# Patient Record
Sex: Male | Born: 1970
Health system: Southern US, Community
[De-identification: ages and names within clinical notes are randomized; demographics above are authoritative.]

## PROBLEM LIST (undated history)

## (undated) DIAGNOSIS — E119 Type 2 diabetes mellitus without complications: Secondary | ICD-10-CM

## (undated) DIAGNOSIS — K7460 Unspecified cirrhosis of liver: Secondary | ICD-10-CM

## (undated) DIAGNOSIS — I8500 Esophageal varices without bleeding: Secondary | ICD-10-CM

## (undated) DIAGNOSIS — H539 Unspecified visual disturbance: Secondary | ICD-10-CM

## (undated) HISTORY — DX: Unspecified visual disturbance: H53.9

## (undated) HISTORY — DX: Unspecified cirrhosis of liver: K74.60

## (undated) HISTORY — DX: Type 2 diabetes mellitus without complications: E11.9

## (undated) HISTORY — PX: SHOULDER ARTHROSCOPY: SHX128

---

## 2012-09-23 ENCOUNTER — Other Ambulatory Visit: Payer: Self-pay | Admitting: Family Medicine

## 2012-09-23 DIAGNOSIS — R7989 Other specified abnormal findings of blood chemistry: Secondary | ICD-10-CM

## 2012-09-28 ENCOUNTER — Ambulatory Visit
Admission: RE | Admit: 2012-09-28 | Discharge: 2012-09-28 | Disposition: A | Discharge status: Home / Self Care | Payer: BC Managed Care – PPO | Source: Ambulatory Visit | Attending: Family Medicine | Admitting: Family Medicine

## 2012-09-28 DIAGNOSIS — R7989 Other specified abnormal findings of blood chemistry: Secondary | ICD-10-CM

## 2012-10-19 ENCOUNTER — Ambulatory Visit: Payer: Self-pay | Admitting: *Deleted

## 2014-03-28 ENCOUNTER — Other Ambulatory Visit: Payer: Self-pay | Admitting: Family Medicine

## 2014-03-28 ENCOUNTER — Ambulatory Visit
Admission: RE | Admit: 2014-03-28 | Discharge: 2014-03-28 | Disposition: A | Discharge status: Home / Self Care | Payer: BLUE CROSS/BLUE SHIELD | Source: Ambulatory Visit | Attending: Family Medicine | Admitting: Family Medicine

## 2014-03-28 DIAGNOSIS — R0789 Other chest pain: Secondary | ICD-10-CM

## 2014-04-26 ENCOUNTER — Other Ambulatory Visit: Payer: Self-pay | Admitting: Gastroenterology

## 2014-04-26 DIAGNOSIS — A09 Infectious gastroenteritis and colitis, unspecified: Secondary | ICD-10-CM

## 2014-04-26 DIAGNOSIS — R1013 Epigastric pain: Secondary | ICD-10-CM

## 2014-05-03 ENCOUNTER — Ambulatory Visit
Admission: RE | Admit: 2014-05-03 | Discharge: 2014-05-03 | Disposition: A | Discharge status: Home / Self Care | Payer: BLUE CROSS/BLUE SHIELD | Source: Ambulatory Visit | Attending: Gastroenterology | Admitting: Gastroenterology

## 2014-05-03 DIAGNOSIS — R1013 Epigastric pain: Secondary | ICD-10-CM

## 2015-07-12 IMAGING — RF DG UGI W/ HIGH DENSITY W/KUB
17 series · 17 of 17 positions shown · non-contrast
Comparison: Ultrasound from the same day

CLINICAL DATA: Epigastric pain.  Diabetes.

EXAM:
UPPER GI SERIES WITH KUB
TECHNIQUE: After obtaining a scout radiograph a routine upper GI series was
performed using thin and high density barium.
FLUOROSCOPY TIME:  1 minutes 54 seconds

[Series 1: run · 1 of 1 slices shown (1 of 16)]
[im 1/1]
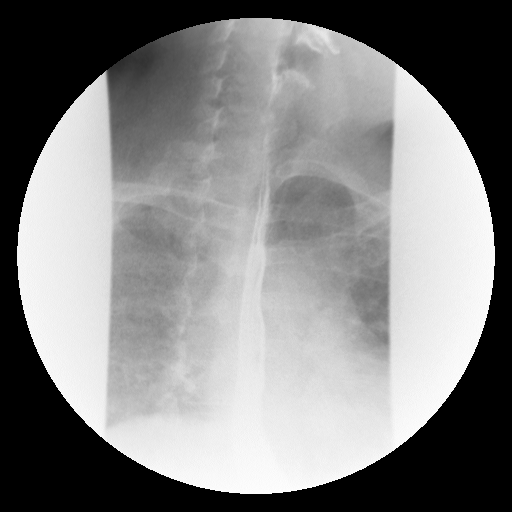

[Series 2: run · 1 of 1 slices shown (2 of 16)]
[im 1/1]
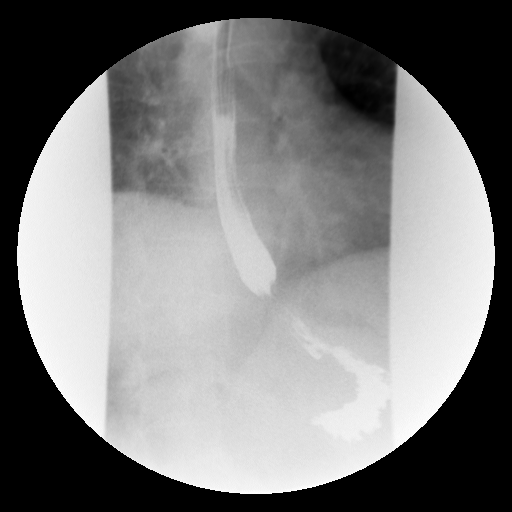

[Series 3: run · 1 of 1 slices shown (3 of 16)]
[im 1/1]
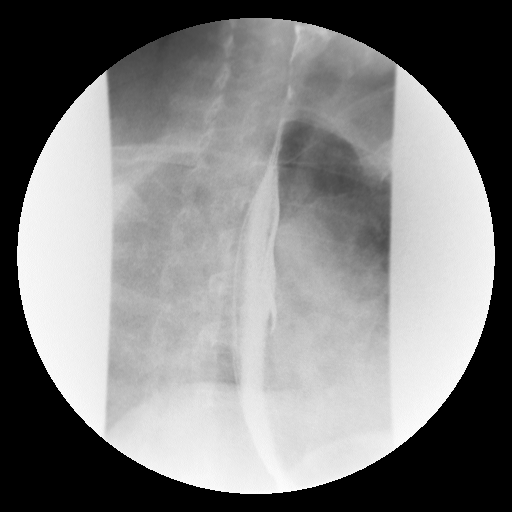

[Series 4: run · 1 of 1 slices shown (4 of 16)]
[im 1/1]
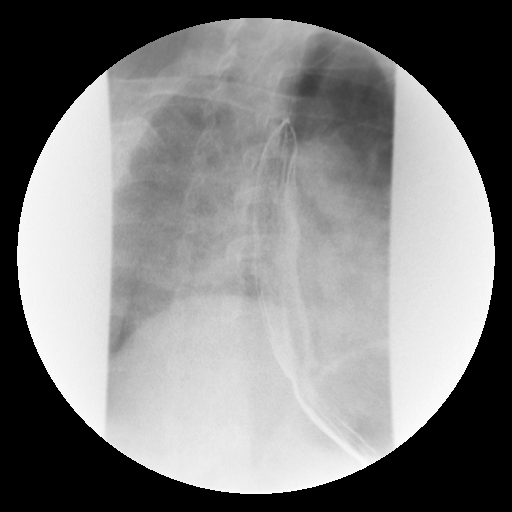

[Series 5: run · 1 of 1 slices shown (5 of 16)]
[im 1/1]
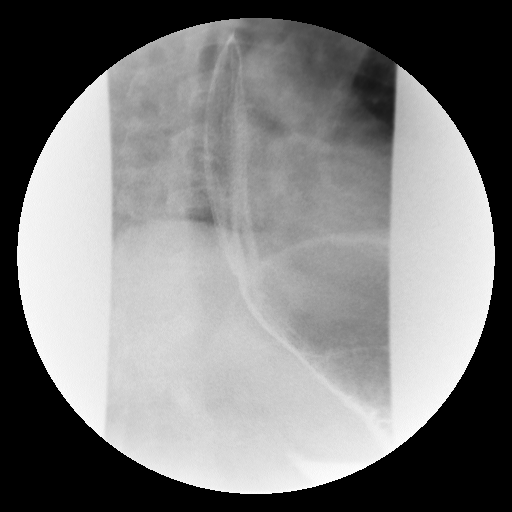

[Series 6: run · 1 of 1 slices shown (6 of 16)]
[im 1/1]
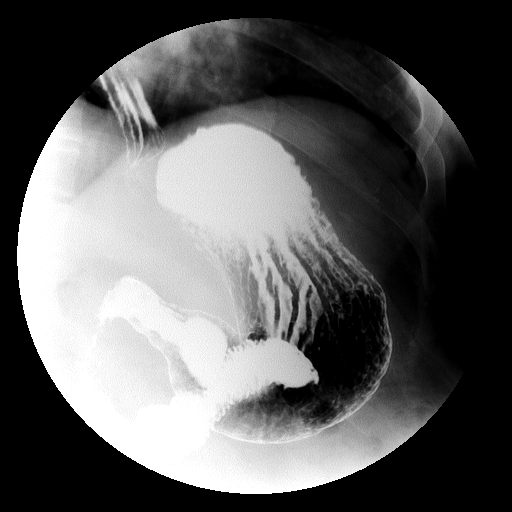

[Series 7: run · 1 of 1 slices shown (7 of 16)]
[im 1/1]
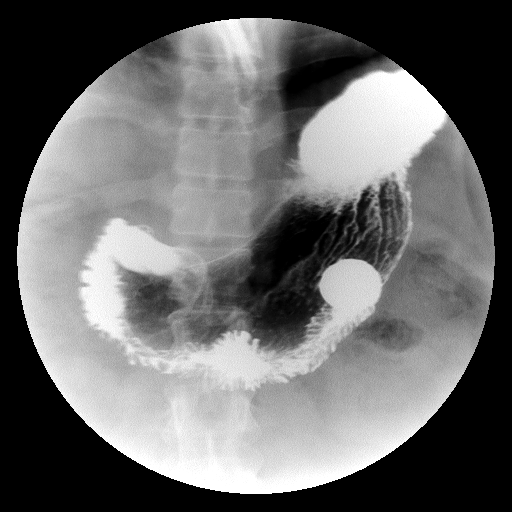

[Series 8: run · 1 of 1 slices shown (8 of 16)]
[im 1/1]
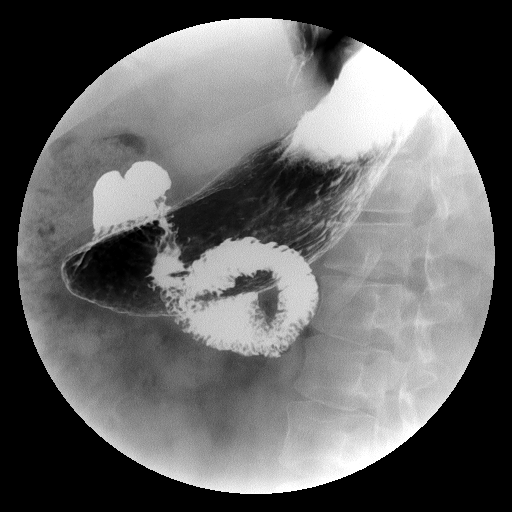

[Series 9: run · 1 of 1 slices shown (9 of 16)]
[im 1/1]
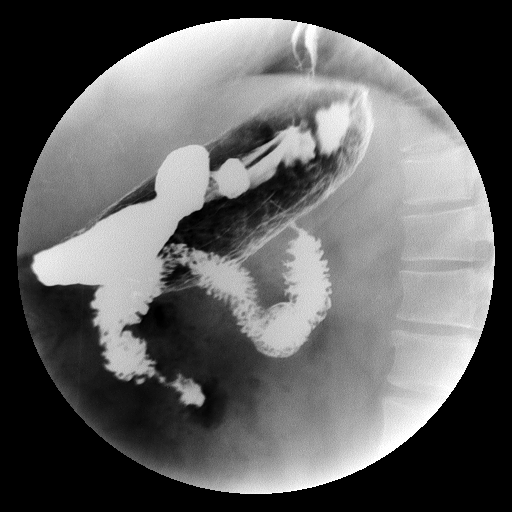

[Series 10: run · 1 of 1 slices shown (10 of 16)]
[im 1/1]
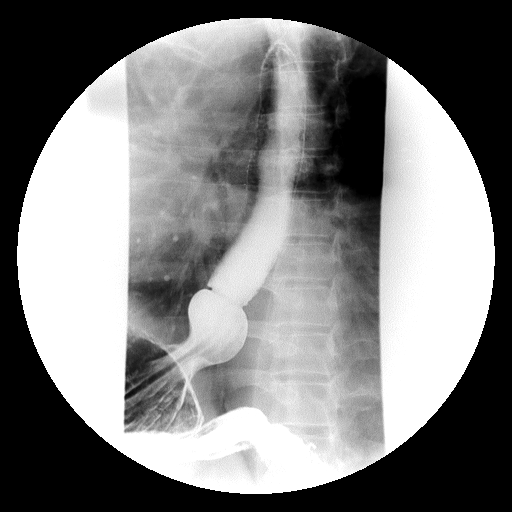

[Series 11: run · 1 of 1 slices shown (11 of 16)]
[im 1/1]
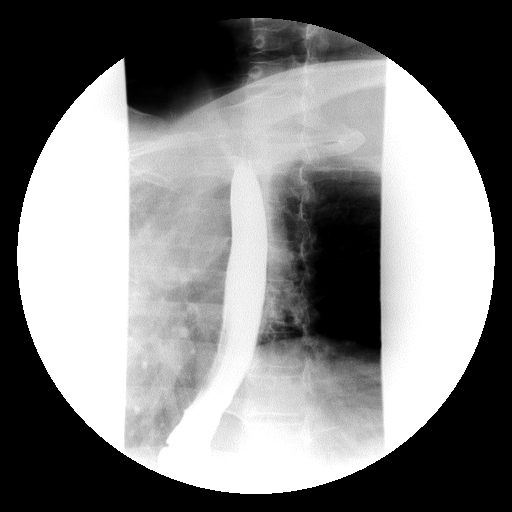

[Series 12: run · 1 of 1 slices shown (12 of 16)]
[im 1/1]
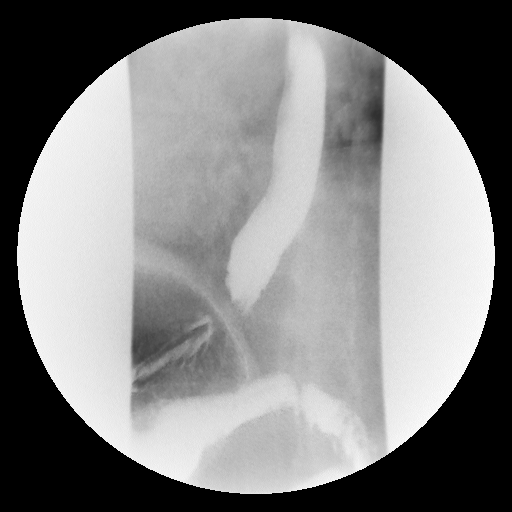

[Series 13: run · 1 of 1 slices shown (13 of 16)]
[im 1/1]
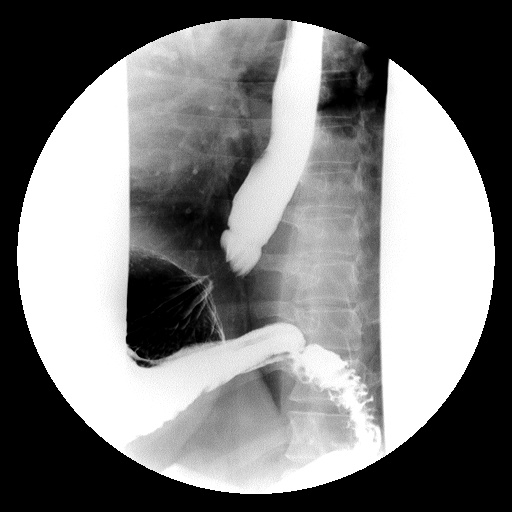

[Series 14: run · 1 of 1 slices shown (14 of 16)]
[im 1/1]
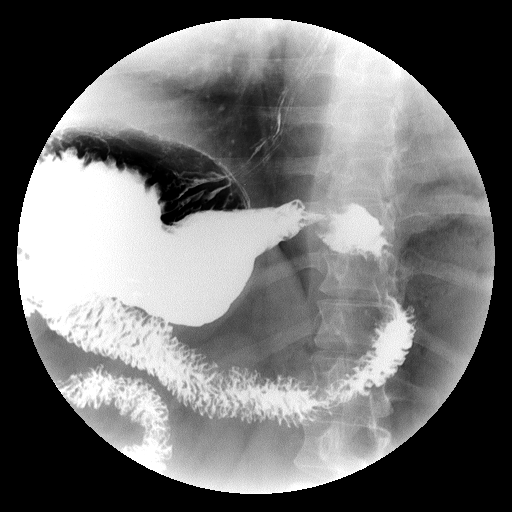

[Series 15: run · 1 of 1 slices shown (15 of 16)]
[im 1/1]
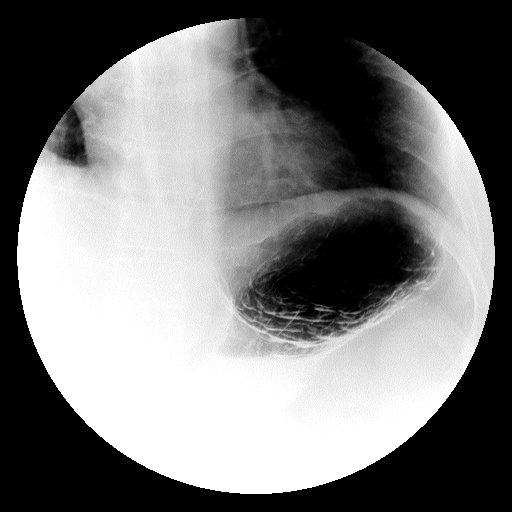

[Series 16: run · 1 of 1 slices shown (16 of 16)]
[im 1/1]
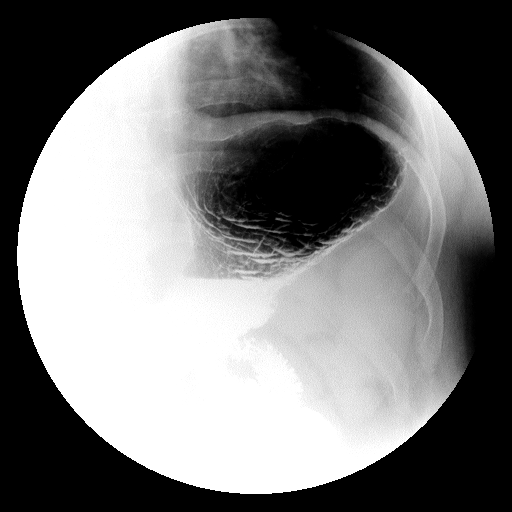

[Series 1001: view not recorded · 0.20mm/px · 1 of 1 slices shown]
[im 1/1]
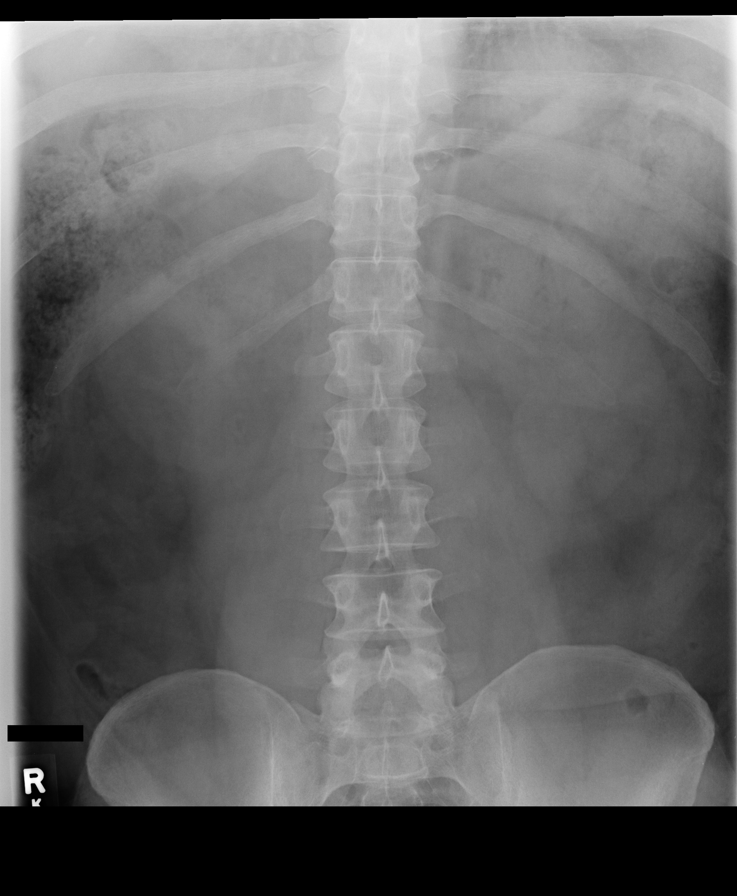

[17 of 17 positions shown; findings below may reference images not displayed]

FINDINGS: Esophagus: Normal folds and mucosa. Normal primary peristalsis
throughout. No stricture. Schatzki ring without significant
narrowing. Small hiatal hernia. No gastroesophageal reflux elicited
despite provocative maneuvers.

Stomach: Small hiatal hernia. Otherwise negative. Contrast flows
normally through the pylorus.

Duodenum: Normal anatomic placement. Bulb distends normally. Distal
duodenum decompressed, unremarkable, normal contrast flow distally.
IMPRESSION: 1. Small hiatal hernia.  Otherwise negative.

## 2017-10-01 ENCOUNTER — Ambulatory Visit
Admission: RE | Admit: 2017-10-01 | Discharge: 2017-10-01 | Disposition: A | Discharge status: Home / Self Care | Payer: BLUE CROSS/BLUE SHIELD | Source: Ambulatory Visit | Attending: Family Medicine | Admitting: Family Medicine

## 2017-10-01 ENCOUNTER — Other Ambulatory Visit: Payer: Self-pay | Admitting: Family Medicine

## 2017-10-01 DIAGNOSIS — R1084 Generalized abdominal pain: Secondary | ICD-10-CM

## 2017-10-01 MED ORDER — IOPAMIDOL (ISOVUE-300) INJECTION 61%
100.0000 mL | Freq: Once | INTRAVENOUS | Status: AC | PRN
  Administered 2017-10-01: 100 mL via INTRAVENOUS

## 2017-11-19 ENCOUNTER — Other Ambulatory Visit: Payer: Self-pay | Admitting: Family Medicine

## 2017-11-19 ENCOUNTER — Ambulatory Visit
Admission: RE | Admit: 2017-11-19 | Discharge: 2017-11-19 | Disposition: A | Discharge status: Home / Self Care | Payer: BLUE CROSS/BLUE SHIELD | Source: Ambulatory Visit | Attending: Family Medicine | Admitting: Family Medicine

## 2017-11-19 DIAGNOSIS — R1084 Generalized abdominal pain: Secondary | ICD-10-CM

## 2017-11-19 DIAGNOSIS — R109 Unspecified abdominal pain: Secondary | ICD-10-CM

## 2017-11-19 MED ORDER — IOPAMIDOL (ISOVUE-300) INJECTION 61%
100.0000 mL | Freq: Once | INTRAVENOUS | Status: AC | PRN
  Administered 2017-11-19: 100 mL via INTRAVENOUS

## 2017-11-26 ENCOUNTER — Other Ambulatory Visit: Payer: Self-pay

## 2017-11-26 ENCOUNTER — Ambulatory Visit (INDEPENDENT_AMBULATORY_CARE_PROVIDER_SITE_OTHER): Payer: BLUE CROSS/BLUE SHIELD | Admitting: Neurology

## 2017-11-26 ENCOUNTER — Encounter: Payer: Self-pay | Admitting: Neurology

## 2017-11-26 VITALS — BP 127/80 | HR 83 | Resp 16 | Ht 63.0 in | Wt 199.0 lb

## 2017-11-26 DIAGNOSIS — M5410 Radiculopathy, site unspecified: Secondary | ICD-10-CM | POA: Diagnosis not present

## 2017-11-26 MED ORDER — LEVETIRACETAM 500 MG PO TABS: 500.0000 mg | ORAL_TABLET | Freq: Two times a day (BID) | ORAL | 3 refills | Status: DC

## 2017-11-26 MED ORDER — EFINACONAZOLE 10 % EX SOLN: 1.0000 [drp] | Freq: Every day | CUTANEOUS | 5 refills | Status: DC

## 2017-11-26 NOTE — Progress Notes (Signed)
Reason for visit: Radiculitis  Referring physician: Dr. Buddy DutyMorrow  Hunter Gray is a 47 y.o. male  History of present illness:  Hunter Gray is a 47 year old right-handed gentleman with a history of diabetes and history of fatty liver with cirrhosis of the liver.  The patient began having radicular type pain on the left side coming from the back down into the lower abdomen just below the umbilicus.  The pain does not cross midline.  He has hypersensitivity to the skin to light touch, he has a sharp shooting or electric shock pain at times and a dull achy pain.  The patient claims that the onset of the pain came on around the time when his diabetes was very poorly controlled, his hemoglobin A1c was around 12.  The patient has had similar discomfort 2 years ago again associated with a timeframe when he was not controlling his diabetes well.  The patient has been placed on gabapentin which is helpful for the pain, but does not fully control the discomfort.  He currently takes 600 mg twice during the day and 900 mg at night.  He may have difficulty sleeping at times.  The patient denies any weakness of the extremities or any numbness of the arms or legs, he is concerned that he may have right carpal tunnel syndrome.  He denies issues controlling the bowels or the bladder and he denies any balance problems.  The patient has undergone a CT scan of the abdomen that did not show an etiology for his pain, he is sent to this office for an evaluation.  He did not note a rash around the time of onset of pain.  Past Medical History:  Diagnosis Date  . Cirrhosis of liver (HCC)   . Diabetes mellitus without complication (HCC)   . Vision abnormalities       Family History  Problem Relation Age of Onset  . Diabetes type II Mother   . Heart disease Mother   . Diabetes type II Father   . Diabetes type II Sister     Social history:  reports that he has quit smoking. He has never used smokeless  tobacco. He reports that he drank alcohol. He reports that he has current or past drug history.  Medications:  Prior to Admission medications   Medication Sig Start Date End Date Taking? Authorizing Provider  gabapentin (NEURONTIN) 300 MG capsule TAKE 2 CAPSULES BY MOUTH EVERY MORNING AND AFTERNOON 3 CAPSULES EVERY NIGHT AT BEDTIME AS DIRECTED 11/19/17  Yes [provider]  insulin regular (NOVOLIN R,HUMULIN R) 100 units/mL injection Inject into the skin 3 (three) times daily before meals.   Yes [provider]  metFORMIN (GLUCOPHAGE) 1000 MG tablet Take 1,000 mg by mouth 2 (two) times daily. 10/06/17  Yes [provider]  pantoprazole (PROTONIX) 40 MG tablet Take 40 mg by mouth every morning. 10/18/17  Yes [provider]  VICTOZA 18 MG/3ML SOPN INJECT 1.2MG  INTO THE SKIN ONCE A DAY 11/18/17  Yes [provider]     Not on File  ROS:  Out of a complete 14 system review of symptoms, the patient complains only of the following symptoms, and all other reviewed systems are negative.  Fatigue Disinterest in activities  Blood pressure 127/80, pulse 83, resp. rate 16, height 5\' 3"  (1.6 m), weight 199 lb (90.3 kg).  Physical Exam  General: The patient is alert and cooperative at the time of the examination.  The patient is  moderately obese.  Eyes: Pupils are equal, round, and reactive to light. Discs are flat bilaterally.  Neck: The neck is supple, no carotid bruits are noted.  Respiratory: The respiratory examination is clear.  Cardiovascular: The cardiovascular examination reveals a regular rate and rhythm, no obvious murmurs or rubs are noted.  Skin: Extremities are without significant edema.  Neurologic Exam  Mental status: The patient is alert and oriented x 3 at the time of the examination. The patient has apparent normal recent and remote memory, with an apparently normal attention span and concentration ability.  Cranial nerves:  Facial symmetry is present. There is good sensation of the face to pinprick and soft touch bilaterally. The strength of the facial muscles and the muscles to head turning and shoulder shrug are normal bilaterally. Speech is well enunciated, no aphasia or dysarthria is noted. Extraocular movements are full. Visual fields are full. The tongue is midline, and the patient has symmetric elevation of the soft palate. No obvious hearing deficits are noted.  Motor: The motor testing reveals 5 over 5 strength of all 4 extremities. Good symmetric motor tone is noted throughout.  Sensory: Sensory testing is intact to pinprick, soft touch, vibration sensation, and position sense on all 4 extremities. No evidence of extinction is noted.  Coordination: Cerebellar testing reveals good finger-nose-finger and heel-to-shin bilaterally.  Gait and station: Gait is normal. Tandem gait is normal. Romberg is negative. No drift is seen.  Reflexes: Deep tendon reflexes are symmetric and normal bilaterally. Toes are downgoing bilaterally.   Assessment/Plan:  1.  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  The patient will be set up for MRI evaluation of the thoracic spine to exclude a compressive radiculopathy.  He will be placed on Keppra along with the gabapentin for the pain.  He will call for any dose adjustments.  If no compressive lesion is noted, and the pain continues, further blood work will be done to look for other etiologies of radiculitis.  He will follow-up in 4 months.  Marlan Palau MD 11/26/2017 10:01 AM  [facility] [address] [place], [place] [address]  Phone [phone] Fax [phone]

## 2017-11-26 NOTE — Patient Instructions (Signed)
We will start keppra for the pain. Start 1/2 of a 500 mg tablet twice a day for 1 week, then take one tablet twice a day.  Call for any dose adjustments.

## 2017-12-06 ENCOUNTER — Encounter: Payer: Self-pay | Admitting: Neurology

## 2017-12-06 ENCOUNTER — Telehealth: Payer: Self-pay | Admitting: Neurology

## 2017-12-06 NOTE — Telephone Encounter (Signed)
Patient's wife Freeman Cal[place]ronn[place] ([place] calling to schedule an MRI for the patient. She can be reached at [phone]

## 2017-12-06 NOTE — Telephone Encounter (Signed)
The MRI Thoracic spine [facility] There is an option for an reconsideration. Part of the reconsideration process is a letter written stating the reason why the exam is needed. I will fax this letter along with the clinical notes .

## 2017-12-06 NOTE — Telephone Encounter (Signed)
A letter has been dictated.

## 2017-12-06 NOTE — Telephone Encounter (Signed)
I spoke to the patient and informed her that [employer] has denied the exam but I am in the process of having it reconsideration with [employer]. I informed her I will keep her updated once I heard something.

## 2017-12-06 NOTE — Telephone Encounter (Signed)
I spoke to nurse Tiffany with [employer]. I faxed all the information and it is pending.

## 2017-12-09 NOTE — Telephone Encounter (Signed)
Noted, thank you

## 2017-12-09 NOTE — Telephone Encounter (Signed)
I called the patient, the insurance company will not allow for MRI evaluation until he has been treated with conservative measures and medications for at least 6 weeks.  He is to contact our office at the end of December if he is not doing well.  I will reorder the study at that point.

## 2017-12-09 NOTE — Telephone Encounter (Signed)
[employer] did not approve the appeal. The denial reason "the exam should be used when the pain has not improved after six weeks of treatment by your doctor. Treatment should include medication and other forms of therapy. These need to include home exercises or physical therapy. This test can be used when a physical exam by your doctor show signs of nerve damage in your middle back.  These signs could include abnormal reflexes or limb weakness."   Please advise how you would like to proceed?

## 2017-12-10 MED ORDER — LEVETIRACETAM 500 MG PO TABS: ORAL_TABLET | ORAL | 3 refills | Status: DC

## 2017-12-10 NOTE — Telephone Encounter (Signed)
Pts wife requesting a call to discuss MRI not being approved.

## 2017-12-10 NOTE — Telephone Encounter (Signed)
I called the patient.  The insurance has denied the MRI of the thoracic spine, requires a 6-week "conservative therapy" timeframe before the scan will be covered, we may be able to get the study in January if the pain continues.  We will go up on the Keppra taking 500 mg in the morning and 1000 mg in the evening.

## 2017-12-10 NOTE — Addendum Note (Signed)
Addended by: York SpanielWILLIS, CHARLES K on: 12/10/2017 01:16 PM   Modules accepted: Orders

## 2017-12-20 ENCOUNTER — Encounter: Payer: Self-pay | Admitting: Neurology

## 2017-12-20 NOTE — Telephone Encounter (Signed)
I talked to the patient's wife.  She has call [employer], [employer] is telling her that all they need is an indication that the MRI ordered is medically necessary.  Apparently they have told us that the study was denied as they are requiring a 6-week timeframe of conservative management.  I will dictate a letter indicating that the MRI of the thoracic spine is medically necessary to exclude a compressive thoracic radiculopathy.  Hopefully we can submit this to [employer] [facility] and get the MRI covered.

## 2017-12-20 NOTE — Telephone Encounter (Signed)
Patient wife called in and was very upset because the MRI is not approved and the patient has been in a lot of pain for awhile and she stated that he needs this MRI before the end of this year.. I informed her they denied the exam due to he needs 6 weeks of "conservative therapy" she stated that no one has reached out to him about doing any kind of therapy..  She stated she would like a call back today to discuss this.

## 2017-12-20 NOTE — Telephone Encounter (Signed)
Noted. MB RN 

## 2017-12-20 NOTE — Telephone Encounter (Signed)
I called and left a message for the wife.  "Conservative therapy" does not mean physical therapy, I am not clear that physical therapy is likely to improve the pain, medication adjustments if needed can be done in the 6-week timeframe.  If he continues to do poorly with the pain, MRI of the thoracic spine will be done after the 6-week "conservative therapy" timeframe.

## 2017-12-20 NOTE — Telephone Encounter (Signed)
Patient wife called back very upset that Dr. Anne HahnWillis called her and left her a voicemail instead of the patient.. I informed her I did ask Dr. Anne HahnWillis to contact the patient instead since she was at work. She states she does not understand where the miscommunication is happening but she stated that Dr. Anne HahnWillis called back and just repeated himself from what he informed her earlier.. She stated that the insurance said it does not need to be a 6 week conservative treatment and that Dr. Anne HahnWillis needs to state that it is medical necessary.. She stated she would now like a call back because she is off work and then she hung the phone up..    I did read her the denial reason to her from 12/09/17 [employer] did not approve the appeal. The denial reason "the exam should be used when the pain has not improved after six weeks of treatment by your doctor. Treatment should include medication and other forms of therapy. These need to include home exercises or physical therapy. This test can be used when a physical exam by your doctor show signs of nerve damage in your middle back.  These signs could include abnormal reflexes or limb weakness."

## 2017-12-20 NOTE — Telephone Encounter (Signed)
She would like a call to be made to her husband who is the patient at [phone] because she will be at work all day today.

## 2017-12-21 NOTE — Telephone Encounter (Signed)
I called and spoke with the patient, scheduled MRI for 12/22/17. DW

## 2017-12-21 NOTE — Telephone Encounter (Signed)
[employer] Auth: [id number] (exp. 12/21/17 to 01/19/18)

## 2017-12-21 NOTE — Telephone Encounter (Signed)
Noted  

## 2017-12-22 ENCOUNTER — Ambulatory Visit (INDEPENDENT_AMBULATORY_CARE_PROVIDER_SITE_OTHER): Payer: BLUE CROSS/BLUE SHIELD

## 2017-12-22 DIAGNOSIS — M5410 Radiculopathy, site unspecified: Secondary | ICD-10-CM | POA: Diagnosis not present

## 2017-12-24 ENCOUNTER — Telehealth: Payer: Self-pay | Admitting: Neurology

## 2017-12-24 NOTE — Telephone Encounter (Signed)
I called the patient.  The MRI of the thoracic spine does not show a compressive lesion affecting the nerve roots, likely this is a diabetic radiculitis, the nerve root should heal over time.   MRI thoracic 12/24/17:  IMPRESSION: This MRI of the thoracic spine shows the following: 1.   Very mild scoliosis that is convex to the left 2.   Spinal cord appears normal. 3.   No degenerative changes are noted.

## 2018-03-01 ENCOUNTER — Other Ambulatory Visit: Payer: Self-pay

## 2018-03-01 ENCOUNTER — Ambulatory Visit: Payer: BLUE CROSS/BLUE SHIELD | Attending: Family Medicine

## 2018-03-01 DIAGNOSIS — R252 Cramp and spasm: Secondary | ICD-10-CM | POA: Insufficient documentation

## 2018-03-01 DIAGNOSIS — R2689 Other abnormalities of gait and mobility: Secondary | ICD-10-CM | POA: Insufficient documentation

## 2018-03-01 DIAGNOSIS — M5442 Lumbago with sciatica, left side: Secondary | ICD-10-CM | POA: Diagnosis not present

## 2018-03-01 NOTE — Therapy (Signed)
[facility] [address] [place], [place], [address] Phone: [phone]   Fax:  [phone]  Physical Therapy Evaluation  Patient Details  Name: Hunter Gray MRN: [id number] Date of Birth: 30-Jun-1970 Referring Provider (PT): Darrow Bussing, MD   Encounter Date: 03/01/2018  PT End of Session - 03/01/18 1014    Visit Number  1    Date for PT Re-Evaluation  04/26/18    Authorization Type  [employer]    PT Start Time  0931    PT Stop Time  1014    PT Time Calculation (min)  43 min    Activity Tolerance  Patient tolerated treatment well    Behavior During Therapy  [facility] for tasks assessed/performed       Past Medical History:  Diagnosis Date  . Cirrhosis of liver (HCC)   . Diabetes mellitus without complication (HCC)   . Vision abnormalities     Past Surgical History:  Procedure Laterality Date  . SHOULDER ARTHROSCOPY Left age 48    There were no vitals filed for this visit.   Subjective Assessment - 03/01/18 0937    Subjective  Pt presents to PT with complaints of Lt LE pain from thigh (anterior and medial) to posterior calf. Pt denies any incident or injury.  Pt is now taking tramadol for the pain.      Pertinent History  diabetes   Likes to be called "Hunter Gray"    Limitations  Walking    How long can you walk comfortably?  pain with walking    Diagnostic tests  none.  MD will order MRI if no change with PT     Patient Stated Goals  reduce Lt LE pain    Currently in Pain?  Yes    Pain Score  6    up to 10/10 with activity   Pain Location  Leg    Pain Orientation  Left    Pain Descriptors / Indicators  Sore    Pain Type  Chronic pain    Pain Onset  1 to 4 weeks ago    Pain Frequency  Constant    Aggravating Factors   night, walking/standing (fairly constant)    Pain Relieving Factors  tramadol, heat         [facility] PT Assessment - 03/01/18 0001      Assessment   Medical Diagnosis  Lt sided sciatica     Referring Provider (PT)  Koirala, Dibas, MD    Onset Date/Surgical Date  02/05/18    Next MD Visit  4 weeks     Prior Therapy  none      Precautions   Precautions  None      Restrictions   Weight Bearing Restrictions  No      Balance Screen   Has the patient fallen in the past 6 months  No    Has the patient had a decrease in activity level because of a fear of falling?   No    Is the patient reluctant to leave their home because of a fear of falling?   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, desk work    Leisure  race [employer] cars, hunting/fishing      Cognition   Overall Cognitive Status  Within Functional Limits for tasks assessed      Observation/Other Assessments   Focus on Therapeutic Outcomes (FOTO)   46% limitation      Posture/Postural Control   Posture/Postural Control  Postural limitations    Postural Limitations  Weight shift right;Posterior pelvic tilt      ROM / Strength   AROM / PROM / Strength  AROM;PROM;Strength      AROM   Overall AROM   Within functional limits for tasks performed    Overall AROM Comments  full lumbar A/ROM: Lt lumbar stiffness reported with Rt lumbar sidebending.        PROM   Overall PROM   Deficits    Overall PROM Comments  hip flexion and IR/ER limited by 25% without pain      Strength   Overall Strength  Within functional limits for tasks performed    Overall Strength Comments  4+/5 bil hip strength, 5/5 knee and ankle strength      Palpation   Palpation comment  trigger point and palpable tenderness over Lt quadratus and upper gluteals      Transfers   Transfers  Independent with all Transfers      Ambulation/Gait    Ambulation/Gait  Yes    Ambulation/Gait Assistance  7: Independent    Gait Pattern  Decreased step length - left;Decreased stance time - left                Objective measurements completed on examination: See above findings.              PT Education - 03/01/18 1011    Education Details  Access Code: [email or web address]    Person(s) Educated  Patient    Methods  Explanation;Demonstration;Handout    Comprehension  Verbalized understanding;Returned demonstration       PT Short Term Goals - 03/01/18 1014      PT SHORT TERM GOAL #1   Title  be independent in initial HEP    Time  4    Period  Weeks    Target Date  03/29/18      PT SHORT TERM GOAL #2   Title  report a 30% reduction in Lt LE pain with standing and walking    Time  4    Period  Weeks    Status  New    Target Date  03/29/18      PT SHORT TERM GOAL #3   Title  report 40% improvement of quality and quantity of sleep    Time  4    Period  Weeks    Status  New    Target Date  03/29/18        PT Long Term Goals - 03/01/18 1048      PT LONG TERM GOAL #1   Title  be independent in advanced HEP    Time  8    Period  Weeks    Target Date  04/26/18      PT LONG TERM GOAL #2   Title  demonstrate symmetry with ambulation on level surface    Time  8    Period  Weeks    Status  New    Target Date  04/26/18      PT LONG  TERM GOAL #3   Title  reduce FOTO to < or = to 29% limitation    Time  8    Period  Weeks    Status  New    Target Date  04/26/18      PT LONG TERM GOAL #4   Title  sleep at night without limitation due to Lt LE pain    Time  8    Period  Weeks    Status  New    Target Date  04/26/18      PT LONG TERM GOAL #5   Title  report < or = to 2/10 Lt LE pain with home and work tasks    Time  8    Period  Weeks    Status  New    Target Date  04/26/18             Plan - 03/01/18 1046    Clinical Impression Statement  Pt presents to PT with 3 week history of Lt LE pain and  gluteal pain that began without incident or injury.  No imaging has been done.  Pt reports 6/10 Lt LE pain over anterior and medial thigh and gastroc and this increases to 10/10 and disrupts sleep at night.  Slump and SLR test are negative.  Pt demonstrates full lumbar A/ROM and hip A/ROM and P/ROM limited by 25% without pain.  Pt with palpable tenderness over Lt quadratus and proximal gluteals.  Pt will benefit from skilled PT for hip and lumbar flexibility, core strength, body mechanics education and dry needling/manual to address trigger points.      History and Personal Factors relevant to plan of care:  none    Clinical Presentation  Stable    Clinical Presentation due to:  3 week history of pain    Clinical Decision Making  Low    Rehab Potential  Excellent    PT Frequency  2x / week    PT Duration  8 weeks    PT Treatment/Interventions  ADLs/Self Care Home Management;Cryotherapy;Electrical Stimulation;Ultrasound;Traction;Gait training;Functional mobility training;Therapeutic activities;Therapeutic exercise;Neuromuscular re-education;Manual techniques;Taping;Dry needling;Passive range of motion    PT Next Visit Plan  body mechanics education, dry needling to Lt quadratus and gluteals, review HEP    PT Home Exercise Plan  Access Code: [email or web address]    Consulted and Agree with Plan of Care  Patient       Patient will benefit from skilled therapeutic intervention in order to improve the following deficits and impairments:  Abnormal gait, Improper body mechanics, Pain, Increased muscle spasms, Postural dysfunction, Decreased activity tolerance, Decreased range of motion, Impaired flexibility, Difficulty walking  Visit Diagnosis: Acute left-sided low back pain with left-sided sciatica - Plan: PT plan of care cert/re-cert  Cramp and spasm - Plan: PT plan of care cert/re-cert  Other abnormalities of gait and mobility - Plan: PT plan of care cert/re-cert     Problem List There are no active  problems to display for this patient.   Lorrene Reid, PT 03/01/18 10:51 AM  [facility] [address], [address] [place], [place], [address] Phone: [phone]   Fax:  [phone]  Name: ORESTES POCHOP MRN: [id number] Date of Birth: 03-31-1970

## 2018-03-01 NOTE — Patient Instructions (Signed)
Access Code: [email or web address]  URL: [email or web address]  Date: 03/01/2018  Prepared by: Lorrene Reid   Exercises Supine Lower Trunk Rotation - 3 reps - 1 sets - 20 hold - 3x daily - 7x weekly Hooklying Single Knee to Chest - 3 reps - 1 sets - 20 hold - 3x daily - 7x weekly Seated Hamstring Stretch - 3 reps - 1 sets - 20 hold - 3x daily - 7x weekly Seated Figure 4 Piriformis Stretch - 3 reps - 1 sets - 20 hold - 2x daily - 7x weekly

## 2018-03-03 ENCOUNTER — Encounter: Payer: Self-pay | Admitting: Physical Therapy

## 2018-03-03 ENCOUNTER — Ambulatory Visit: Payer: BLUE CROSS/BLUE SHIELD | Admitting: Physical Therapy

## 2018-03-03 DIAGNOSIS — R2689 Other abnormalities of gait and mobility: Secondary | ICD-10-CM

## 2018-03-03 DIAGNOSIS — M5442 Lumbago with sciatica, left side: Secondary | ICD-10-CM

## 2018-03-03 DIAGNOSIS — R252 Cramp and spasm: Secondary | ICD-10-CM

## 2018-03-03 NOTE — Patient Instructions (Signed)

## 2018-03-03 NOTE — Therapy (Signed)
[facility] [address], [address] [place], [place], [address] Phone: [phone]   Fax:  [phone]  Physical Therapy Treatment  Patient Details  Name: Hunter Gray MRN: [id number] Date of Birth: [birth year] Referring Provider (PT): Darrow Bussing, MD   Encounter Date: 03/03/2018  PT End of Session - 03/03/18 0857    Visit Number  2    Date for PT Re-Evaluation  04/26/18    Authorization Type  BCBS    PT Start Time  0853    PT Stop Time  0936    PT Time Calculation (min)  43 min    Activity Tolerance  Patient tolerated treatment well    Behavior During Therapy  [facility] for tasks assessed/performed       Past Medical History:  Diagnosis Date  . Cirrhosis of liver (HCC)   . Diabetes mellitus without complication (HCC)   . Vision abnormalities     Past Surgical History:  Procedure Laterality Date  . SHOULDER ARTHROSCOPY Left age 48    There were no vitals filed for this visit.  Subjective Assessment - 03/03/18 0857    Subjective  Pain is really bad at night.  I didn't sleep until 4am this morning.      Pertinent History  diabetes   Likes to be called "[name]"    Patient Stated Goals  reduce Lt LE pain    Currently in Pain?  Yes    Pain Score  7     Pain Location  Back    Pain Orientation  Left    Pain Descriptors / Indicators  Sore    Pain Type  Chronic pain    Pain Radiating Towards  thigh down to knee    Pain Frequency  Constant    Aggravating Factors   night    Pain Relieving Factors  tramadol, heat    Multiple Pain Sites  No                       [facility] Adult PT Treatment/Exercise - 03/03/18 0001      Exercises   Exercises  Knee/Hip      Knee/Hip Exercises: Stretches   Active Hamstring Stretch  Right;Left;2 reps;20 seconds    Piriformis Stretch  Right;Left;2 reps;20 seconds      Modalities   Modalities  Electrical Stimulation;Moist Heat      Moist Heat Therapy   Number Minutes  Moist Heat  15 Minutes    Moist Heat Location  Lumbar Spine      Electrical Stimulation   Electrical Stimulation Location  left lumbar    Electrical Stimulation Action  IFC    Electrical Stimulation Parameters  to tolerance x 15 min    Electrical Stimulation Goals  Pain      Manual Therapy   Manual Therapy  Soft tissue mobilization    Manual therapy comments  left glutes, bil lumbar paraspinlas, left QL       Trigger Point Dry Needling - 03/03/18 4825    Consent Given?  Yes    Education Handout Provided  Yes    Muscles Treated Upper Body  Quadratus Lumborum   Left   Muscles Treated Lower Body  Gluteus minimus;Gluteus maximus;Piriformis;Tensor fascia lata   Left   Gluteus Maximus Response  Twitch response elicited;Palpable increased muscle length    Gluteus Minimus Response  Twitch response elicited;Palpable increased muscle length    Piriformis Response  Twitch response elicited;Palpable increased  muscle length    Tensor Fascia Lata Response  Twitch response elicited;Palpable increased muscle length           PT Education - 03/03/18 0937    Education Details  dry needle aftercare    Person(s) Educated  Patient    Methods  Explanation;Handout    Comprehension  Verbalized understanding       PT Short Term Goals - 03/03/18 0936      PT SHORT TERM GOAL #1   Title  be independent in initial HEP    Status  On-going      PT SHORT TERM GOAL #2   Title  report a 30% reduction in Lt LE pain with standing and walking    Status  On-going      PT SHORT TERM GOAL #3   Title  report 40% improvement of quality and quantity of sleep    Baseline  couldn't sleep until 4am    Status  On-going        PT Long Term Goals - 03/01/18 1048      PT LONG TERM GOAL #1   Title  be independent in advanced HEP    Time  8    Period  Weeks    Target Date  04/26/18      PT LONG TERM GOAL #2   Title  demonstrate symmetry with ambulation on level surface    Time  8    Period  Weeks     Status  New    Target Date  04/26/18      PT LONG TERM GOAL #3   Title  reduce FOTO to < or = to 29% limitation    Time  8    Period  Weeks    Status  New    Target Date  04/26/18      PT LONG TERM GOAL #4   Title  sleep at night without limitation due to Lt LE pain    Time  8    Period  Weeks    Status  New    Target Date  04/26/18      PT LONG TERM GOAL #5   Title  report < or = to 2/10 Lt LE pain with home and work tasks    Time  8    Period  Weeks    Status  New    Target Date  04/26/18            Plan - 03/03/18 0929    Clinical Impression Statement  Pt had  a good twitch response in glute med/min on left side.  He responded well from manual therapy to left QL.  No progress noted yet due to first treatment since eval.  Pt will benefit from skilled PT to continue to address muscle spams and pain management.    PT Treatment/Interventions  ADLs/Self Care Home Management;Cryotherapy;Electrical Stimulation;Ultrasound;Traction;Gait training;Functional mobility training;Therapeutic activities;Therapeutic exercise;Neuromuscular re-education;Manual techniques;Taping;Dry needling;Passive range of motion    PT Next Visit Plan  f/u on dry needling response to left gluteals and QL, body mechanics education     PT Home Exercise Plan  Access Code: [email or web address]    Consulted and Agree with Plan of Care  Patient       Patient will benefit from skilled therapeutic intervention in order to improve the following deficits and impairments:  Abnormal gait, Improper body mechanics, Pain, Increased muscle spasms, Postural dysfunction, Decreased activity tolerance, Decreased range of motion, Impaired flexibility, Difficulty  walking  Visit Diagnosis: Acute left-sided low back pain with left-sided sciatica  Cramp and spasm  Other abnormalities of gait and mobility     Problem List There are no active problems to display for this patient.   Vincente Poli, PT 03/03/2018, 9:50 AM  [facility] [address] [place], [place], [address] Phone: [phone]   Fax:  [phone]  Name: ARYK SHIFLET MRN: [id number] Date of Birth: 1970-03-12

## 2018-03-07 ENCOUNTER — Ambulatory Visit: Payer: BLUE CROSS/BLUE SHIELD | Attending: Family Medicine | Admitting: Physical Therapy

## 2018-03-07 DIAGNOSIS — R252 Cramp and spasm: Secondary | ICD-10-CM

## 2018-03-07 DIAGNOSIS — R2689 Other abnormalities of gait and mobility: Secondary | ICD-10-CM | POA: Diagnosis present

## 2018-03-07 DIAGNOSIS — M5442 Lumbago with sciatica, left side: Secondary | ICD-10-CM | POA: Diagnosis not present

## 2018-03-07 NOTE — Patient Instructions (Signed)
TENS 7000  TENS 7000 2nd Edition Digital TENS Unit with Accessories  by TENS 7000  4.6 out of 5 stars 12,869 ratings   1000 answered questions  [employer]'s Choice for "tens 7000"   [facility] [address], [address] [place], [place] [address] Phone # [phone] Fax [phone]

## 2018-03-07 NOTE — Therapy (Addendum)
[facility] [address] [place], [place], [address] Phone: [phone]   Fax:  [phone]  Physical Therapy Treatment  Patient Details  Name: Hunter Gray MRN: [id number] Date of Birth: [birth year] Referring Provider (PT): Lujean Amel, MD   Encounter Date: 03/07/2018  PT End of Session - 03/07/18 1148    Visit Number  3    Date for PT Re-Evaluation  04/26/18    Authorization Type  BCBS    PT Start Time  1148    PT Stop Time  1230    PT Time Calculation (min)  42 min    Activity Tolerance  Patient tolerated treatment well    Behavior During Therapy  [facility] for tasks assessed/performed       Past Medical History:  Diagnosis Date  . Cirrhosis of liver ([place])   . Diabetes mellitus without complication ([place])   . Vision abnormalities     Past Surgical History:  Procedure Laterality Date  . SHOULDER ARTHROSCOPY Left age 72    There were no vitals filed for this visit.  Subjective Assessment - 03/07/18 1152    Subjective  Pt states he felt better after leaving here until Saturday then pain came back.  He hasn't taken pain medicine for a few days.    Pertinent History  diabetes   Likes to be called "Ulice Dash"    Patient Stated Goals  reduce Lt LE pain    Currently in Pain?  Yes    Pain Score  7     Pain Location  Back    Pain Orientation  Left    Pain Descriptors / Indicators  Sore    Pain Radiating Towards  left low back down front of thigh and medial knee    Pain Relieving Factors  pain medicine and heat    Multiple Pain Sites  No                       [facility] Adult PT Treatment/Exercise - 03/07/18 0001      Exercises   Exercises  Lumbar      Lumbar Exercises: Stretches   Lower Trunk Rotation  5 reps;10 seconds    Piriformis Stretch  Right;Left;2 reps;20 seconds      Lumbar Exercises: Supine   Ab Set  10 reps   TrA   Pelvic Tilt  10 reps    Clam  20 reps   green   Large Ball  Abdominal Isometric  15 reps   UE overhead with red ball     Knee/Hip Exercises: Supine   Other Supine Knee/Hip Exercises  foam roller under sacrum on supine - double and single knee to chest, small trunk rotation             PT Education - 03/07/18 1350    Education Details   Access Code: [email or web address] and TENS info    Person(s) Educated  Patient    Methods  Explanation;Demonstration;Handout    Comprehension  Verbalized understanding       PT Short Term Goals - 03/03/18 0936      PT SHORT TERM GOAL #1   Title  be independent in initial HEP    Status  On-going      PT SHORT TERM GOAL #2   Title  report a 30% reduction in Lt LE pain with standing and walking    Status  On-going      PT SHORT TERM  GOAL #3   Title  report 40% improvement of quality and quantity of sleep    Baseline  couldn't sleep until 4am    Status  On-going        PT Long Term Goals - 03/01/18 1048      PT LONG TERM GOAL #1   Title  be independent in advanced HEP    Time  8    Period  Weeks    Target Date  04/26/18      PT LONG TERM GOAL #2   Title  demonstrate symmetry with ambulation on level surface    Time  8    Period  Weeks    Status  New    Target Date  04/26/18      PT LONG TERM GOAL #3   Title  reduce FOTO to < or = to 29% limitation    Time  8    Period  Weeks    Status  New    Target Date  04/26/18      PT LONG TERM GOAL #4   Title  sleep at night without limitation due to Lt LE pain    Time  8    Period  Weeks    Status  New    Target Date  04/26/18      PT LONG TERM GOAL #5   Title  report < or = to 2/10 Lt LE pain with home and work tasks    Time  8    Period  Weeks    Status  New    Target Date  04/26/18            Plan - 03/07/18 1217    Clinical Impression Statement  Pt states he has been in more pain but has not been taking pain medicine.  Pt did well with exercises with towel under the sacrum.  He was then able to tolerate foam roll under sacrum.  Here  he was able to demonstrate improved TrA activation.  Pt reported decreased pain after treatment.  He was educated in activating TrA througout the day and to add exercise to HEP. Pt will continue to benefit from core strengthening and addressing improved posture.      PT Treatment/Interventions  ADLs/Self Care Home Management;Cryotherapy;Electrical Stimulation;Ultrasound;Traction;Gait training;Functional mobility training;Therapeutic activities;Therapeutic exercise;Neuromuscular re-education;Manual techniques;Taping;Dry needling;Passive range of motion    PT Next Visit Plan  DN #2 left glutes and QL, continue TrA and core strength    PT Home Exercise Plan  Access Code: [email or web address]    Consulted and Agree with Plan of Care  Patient       Patient will benefit from skilled therapeutic intervention in order to improve the following deficits and impairments:  Abnormal gait, Improper body mechanics, Pain, Increased muscle spasms, Postural dysfunction, Decreased activity tolerance, Decreased range of motion, Impaired flexibility, Difficulty walking  Visit Diagnosis: Acute left-sided low back pain with left-sided sciatica  Cramp and spasm  Other abnormalities of gait and mobility     Problem List There are no active problems to display for this patient.   Camillo Flaming Desenglau, PT 03/07/2018, 1:58 PM PHYSICAL THERAPY DISCHARGE SUMMARY  Visits from Start of Care: 3   Current functional level related to goals / functional outcomes: Pt attended 3 PT sessions and then didn't return for further treatments.  Pt had 3 no-show appointments which is in violation of our attendance policy.      Remaining deficits: See above for most  current PT status.    Education / Equipment: HEP, posture/body mechanics  Plan: Patient agrees to discharge.  Patient goals were not met. Patient is being discharged due to not returning since the last visit.  ?????        Sigurd Sos, PT 03/31/18 3:34 PM  [facility] [address], [place], [place], [address] Phone: [phone]   Fax:  [phone]  Name: Hunter Gray MRN: [id number] Date of Birth: [birth year]

## 2018-03-10 ENCOUNTER — Ambulatory Visit: Payer: BLUE CROSS/BLUE SHIELD

## 2018-03-16 ENCOUNTER — Telehealth: Payer: Self-pay

## 2018-03-16 ENCOUNTER — Ambulatory Visit: Payer: BLUE CROSS/BLUE SHIELD

## 2018-03-16 NOTE — Telephone Encounter (Signed)
PT called pt due to no-show appointment.  PT didn't leave message on voicemail as this was his business phone.

## 2018-03-22 ENCOUNTER — Ambulatory Visit: Payer: BLUE CROSS/BLUE SHIELD

## 2018-03-24 ENCOUNTER — Ambulatory Visit: Payer: BLUE CROSS/BLUE SHIELD

## 2018-03-24 NOTE — Progress Notes (Deleted)
PATIENT: Hunter Gray DOB: 06/19/1970  REASON FOR VISIT: follow up HISTORY FROM: patient  HISTORY OF PRESENT ILLNESS: Hunter Gray is a 48 year old male with history of diabetes. Last seen in November 2019, c/o radicular pain on the left side of his back down into lower abdomen.  Pain occurred when his diabetes was poorly controlled. The pain followed a radicular pattern in the T11 distribution. He was started on Keppra and continued taking gabapentin. He had MRI of the thoracic spine that did not show compressive lesion, likely diabetic radiculitis. His Keppra was increased to 500 mg in the morning, 1000 mg in the evening.    HISTORY 11/26/2017 Dr. Anne Hahn Mr. Goldwire is a 48 year old right-handed gentleman with a history of diabetes and history of fatty liver with cirrhosis of the liver.  The patient began having radicular type pain on the left side coming from the back down into the lower abdomen just below the umbilicus.  The pain does not cross midline.  He has hypersensitivity to the skin to light touch, he has a sharp shooting or electric shock pain at times and a dull achy pain.  The patient claims that the onset of the pain came on around the time when his diabetes was very poorly controlled, his hemoglobin A1c was around 12.  The patient has had similar discomfort 2 years ago again associated with a timeframe when he was not controlling his diabetes well.  The patient has been placed on gabapentin which is helpful for the pain, but does not fully control the discomfort.  He currently takes 600 mg twice during the day and 900 mg at night.  He may have difficulty sleeping at times.  The patient denies any weakness of the extremities or any numbness of the arms or legs, he is concerned that he may have right carpal tunnel syndrome.  He denies issues controlling the bowels or the bladder and he denies any balance problems.  The patient has undergone a CT scan of the abdomen that did not  show an etiology for his pain, he is sent to this office for an evaluation.  He did not note a rash around the time of onset of pain.   REVIEW OF SYSTEMS: Out of a complete 14 system review of symptoms, the patient complains only of the following symptoms, and all other reviewed systems are negative.  ALLERGIES: Not on File  HOME MEDICATIONS: Outpatient Medications Prior to Visit  Medication Sig Dispense Refill   Efinaconazole 10 % SOLN Apply 1 drop topically daily. 1 Bottle 5   gabapentin (NEURONTIN) 300 MG capsule TAKE 2 CAPSULES BY MOUTH EVERY MORNING AND AFTERNOON 3 CAPSULES EVERY NIGHT AT BEDTIME AS DIRECTED  1   insulin regular (NOVOLIN R,HUMULIN R) 100 units/mL injection Inject into the skin 3 (three) times daily before meals.     levETIRAcetam (KEPPRA) 500 MG tablet 1 tablet in the morning, 2 in the evening 60 tablet 3   metFORMIN (GLUCOPHAGE) 1000 MG tablet Take 1,000 mg by mouth 2 (two) times daily.  0   pantoprazole (PROTONIX) 40 MG tablet Take 40 mg by mouth every morning.  1   traMADol (ULTRAM) 50 MG tablet Take by mouth every 6 (six) hours as needed.     VICTOZA 18 MG/3ML SOPN INJECT 1.2MG  INTO THE SKIN ONCE A DAY  11   No facility-administered medications prior to visit.     PAST MEDICAL HISTORY: Past Medical History:  Diagnosis Date   Cirrhosis  of liver (HCC)    Diabetes mellitus without complication (HCC)    Vision abnormalities     PAST SURGICAL HISTORY: Past Surgical History:  Procedure Laterality Date   SHOULDER ARTHROSCOPY Left age 15    FAMILY HISTORY: Family History  Problem Relation Age of Onset   Diabetes type II Mother    Heart disease Mother    Diabetes type II Father    Diabetes type II Sister     SOCIAL HISTORY: Social History   Socioeconomic History   Marital status: Married    Spouse name: Not on file   Number of children: Not on file   Years of education: Not on file   Highest education level: Not on file    Occupational History   Not on file  Social Needs   Financial resource strain: Not on file   Food insecurity:    Worry: Not on file    Inability: Not on file   Transportation needs:    Medical: Not on file    Non-medical: Not on file  Tobacco Use   Smoking status: Former Smoker   Smokeless tobacco: Never Used  Substance and Sexual Activity   Alcohol use: Not Currently   Drug use: Not Currently   Sexual activity: Not on file  Lifestyle   Physical activity:    Days per week: Not on file    Minutes per session: Not on file   Stress: Not on file  Relationships   Social connections:    Talks on phone: Not on file    Gets together: Not on file    Attends religious service: Not on file    Active member of club or organization: Not on file    Attends meetings of clubs or organizations: Not on file    Relationship status: Not on file   Intimate partner violence:    Fear of current or ex partner: Not on file    Emotionally abused: Not on file    Physically abused: Not on file    Forced sexual activity: Not on file  Other Topics Concern   Not on file  Social History Narrative   Not on file      PHYSICAL EXAM  There were no vitals filed for this visit. There is no height or weight on file to calculate BMI.  Generalized: Well developed, in no acute distress   Neurological examination  Mentation: Alert oriented to time, place, history taking. Follows all commands speech and language fluent Cranial nerve II-XII: Pupils were equal round reactive to light. Extraocular movements were full, visual field were full on confrontational test. Facial sensation and strength were normal. Uvula tongue midline. Head turning and shoulder shrug  were normal and symmetric. Motor: The motor testing reveals 5 over 5 strength of all 4 extremities. Good symmetric motor tone is noted throughout.  Sensory: Sensory testing is intact to soft touch on all 4 extremities. No evidence of  extinction is noted.  Coordination: Cerebellar testing reveals good finger-nose-finger and heel-to-shin bilaterally.  Gait and station: Gait is normal. Tandem gait is normal. Romberg is negative. No drift is seen.  Reflexes: Deep tendon reflexes are symmetric and normal bilaterally.   DIAGNOSTIC DATA (LABS, IMAGING, TESTING) - I reviewed patient records, labs, notes, testing and imaging myself where available.  No results found for: WBC, HGB, HCT, MCV, PLT No results found for: NA, K, CL, CO2, GLUCOSE, BUN, CREATININE, CALCIUM, PROT, ALBUMIN, AST, ALT, ALKPHOS, BILITOT, GFRNONAA, GFRAA No results  found for: CHOL, HDL, LDLCALC, LDLDIRECT, TRIG, CHOLHDL No results found for: [id number] No results found for: VITAMINB12 No results found for: TSH    ASSESSMENT AND PLAN 48 y.o. year old male  has a past medical history of Cirrhosis of liver (HCC), Diabetes mellitus without complication (HCC), and Vision abnormalities. here with ***   I spent 15 minutes with the patient. 50% of this time was spent   Margie EgeSarah Henri Baumler, [place]NP-[place], DNP 03/24/2018, 3:19 PM [facility] [address], [address] [place], [place] [address] [phone]

## 2018-03-25 ENCOUNTER — Telehealth: Payer: Self-pay

## 2018-03-25 NOTE — Telephone Encounter (Signed)
Unable to get in contact with the patient. I left a voicemail letting him know that his appointment with Maralyn Sago on 03-28-2018 has been cancelled.

## 2018-03-28 ENCOUNTER — Ambulatory Visit: Payer: BLUE CROSS/BLUE SHIELD | Admitting: Nurse Practitioner

## 2018-03-28 ENCOUNTER — Other Ambulatory Visit: Payer: Self-pay

## 2018-03-28 ENCOUNTER — Telehealth: Payer: Self-pay

## 2018-03-28 ENCOUNTER — Telehealth (INDEPENDENT_AMBULATORY_CARE_PROVIDER_SITE_OTHER): Payer: BLUE CROSS/BLUE SHIELD | Admitting: Neurology

## 2018-03-28 ENCOUNTER — Ambulatory Visit: Payer: BLUE CROSS/BLUE SHIELD | Admitting: Neurology

## 2018-03-28 DIAGNOSIS — M5410 Radiculopathy, site unspecified: Secondary | ICD-10-CM

## 2018-03-28 NOTE — Progress Notes (Signed)
Virtual Visit via Telephone Note  I connected with [name] on 03/28/18 at  9:45 AM EDT by telephone and verified that I am speaking with the correct person using two identifiers.   I discussed the limitations, risks, security and privacy concerns of performing an evaluation and management service by telephone and the availability of in person appointments. I also discussed with the patient that there may be a patient responsible charge related to this service. The patient expressed understanding and agreed to proceed.   History of Present Illness:  [name] is a 48 year old male who presents for follow-up for likely diabetic radiculitis, evaluated November 2019.  At that time he presented with pain on his left side at T11 distribution.  He was currently taking gabapentin 600 mg twice daily, 900 mg at bedtime.  He was started on Keppra at that time.  He had MRI of the thoracic spine in December 2019, did not show a compressive lesion affecting the nerve roots, likely this is a diabetic radiculitis, the nerve root should heal over time.  I called the patient today and he reports that his left-sided thoracic pain has completely subsided.  He is now complaining of a new problem.  He reports about 6 weeks ago he developed left inner thigh pain, feeling of muscle soreness, pain extending into his left glutes, lower back, left shoulder blade.  He reports he was evaluated by his primary care doctor and told it was Sciatica.  At that time he increased his gabapentin, started taking tramadol.  He reports the tramadol did help.  His primary care doctor set him up with physical therapy, he reports he has done this about 3 times.  He felt like the physical therapy was making it worse.  He reports the pain is worse in the evening around 5:00.  The pain is less during the day, in the morning.  He feels that his hamstring, inner thigh is tender to touch, feels like it is bruised.  He reports his doctor did  blood work including evaluation for autoimmune diseases which he reports were negative.  He reports around this time his right hand was swollen, broke out in hives, took prednisone.  He reports the Keppra Dr. Anne Hahn had prescribed was not helping.  He denies any numbness or weakness in his extremities.  He denies any falls.  He reports his blood sugars have been doing well, his last A1c was around 7 or 8.  He has been trying to do stretching exercises at home.  He is requesting a refill for his tramadol medication.  He would like to be evaluated by Dr. Anne Hahn for a new problem.   11/26/2017 Dr. Anne Hahn: [name] is a 48 year old right-handed gentleman with a history of diabetes and history of fatty liver with cirrhosis of the liver.  The patient began having radicular type pain on the left side coming from the back down into the lower abdomen just below the umbilicus.  The pain does not cross midline.  He has hypersensitivity to the skin to light touch, he has a sharp shooting or electric shock pain at times and a dull achy pain.  The patient claims that the onset of the pain came on around the time when his diabetes was very poorly controlled, his hemoglobin A1c was around 12.  The patient has had similar discomfort 2 years ago again associated with a timeframe when he was not controlling his diabetes well.  The patient has been placed  on gabapentin which is helpful for the pain, but does not fully control the discomfort.  He currently takes 600 mg twice during the day and 900 mg at night.  He may have difficulty sleeping at times.  The patient denies any weakness of the extremities or any numbness of the arms or legs, he is concerned that he may have right carpal tunnel syndrome.  He denies issues controlling the bowels or the bladder and he denies any balance problems.  The patient has undergone a CT scan of the abdomen that did not show an etiology for his pain, he is sent to this office for an evaluation.   He did not note a rash around the time of onset of pain.   Observations/Objective: Pleasant conversation, appropriate verbal responses, knowledgeable of health condition.   Assessment and Plan: 1.  T11 thoracic pain  This pain has completely subsided and is no longer an issue.  Today he is complaining of a new problem.  He reports about 6 weeks ago he developed left inner thigh pain, feeling of tenderness to his thigh, feels like it is bruised, pain is worse at night.  He is currently taking gabapentin and tramadol, being treated for sciatica by his primary care doctor. He has done 3 physical therapy sessions.  He is requesting a new problem evaluation by Dr. Anne Hahn.  Follow Up Instructions:  He will follow-up with Dr. Anne Hahn for a new problem visit.  I advised him in the meantime to keep close follow-up with his primary care doctor.  I advised him to let us know if his symptoms worsen or if he develops any new symptoms. I will message Dr. Clarisa Kindred nurse to ensure this patient is evaluated soon.   I discussed the assessment and treatment plan with the patient. The patient was provided an opportunity to ask questions and all were answered. The patient agreed with the plan and demonstrated an understanding of the instructions.   The patient was advised to call back or seek an in-person evaluation if the symptoms worsen or if the condition fails to improve as anticipated.  I provided 30 minutes of non-face-to-face time during this encounter.  This service was provided via telemedicine after obtaining patient consent The patient was located at home The provider was located at [facility] Office Referring provider:established pt at this office Names of people participating in the telemedicine service and their role:  -Patient:Kaseem Baldwin [place]  -Provider: Margie Ege, Edrick Oh, DNP  -Nurse: Rozell Searing, arranging telehealth services  Time spent on the call: 30  mins   Otila Kluver, DNP  [facility] [place] [facility] [address] [place], [place] [address] [phone]

## 2018-03-28 NOTE — Progress Notes (Signed)
I have read the note, and I agree with the clinical assessment and plan.  Charles K Willis   

## 2018-03-28 NOTE — Telephone Encounter (Signed)
Spoke with the patient and he has consented to a telephone visit. He's available now.  Concerns: Patient mentioned that he is currently taking Tramadol for the pain in his Left leg, inner thigh, glute and shoulder blade. Gabapentin doesn't work so he was prescribed Tramadol for the pain but was only given 14 tablets he is currently out of this medication. He stated that he is in severe pain at night. No changes to medical or surgical history. Medication list is the same.

## 2018-03-30 ENCOUNTER — Encounter: Payer: Self-pay | Admitting: Neurology

## 2018-03-30 ENCOUNTER — Ambulatory Visit (INDEPENDENT_AMBULATORY_CARE_PROVIDER_SITE_OTHER): Payer: BLUE CROSS/BLUE SHIELD | Admitting: Neurology

## 2018-03-30 ENCOUNTER — Other Ambulatory Visit: Payer: Self-pay

## 2018-03-30 VITALS — BP 136/79 | HR 83 | Ht 63.0 in | Wt 202.3 lb

## 2018-03-30 DIAGNOSIS — M5410 Radiculopathy, site unspecified: Secondary | ICD-10-CM | POA: Diagnosis not present

## 2018-03-30 MED ORDER — CARBAMAZEPINE 200 MG PO TABS: ORAL_TABLET | ORAL | 3 refills | Status: DC

## 2018-03-30 NOTE — Progress Notes (Signed)
Reason for visit: Diabetic radiculitis  ATO Hunter Gray is an 48 y.o. male  History of present illness:  Hunter Gray is a 48 year old right-handed white male with a history of diabetes and obesity.  The patient was seen in November 2019 for what appeared to be a diabetic radiculitis affecting the T11 level on the left.  The patient seemed to improve with use of gabapentin, he had started tapering down the dose.  Within the last 2 months however he has had some change in his clinical condition.  The patient at one point had swelling and joint pain involving the right hand associated with a rash that went over the arm and across the chest bilaterally.  The patient got better within a couple weeks with using Benadryl.  Shortly thereafter, the patient began noting pain that spread down into the left thigh medially and laterally.  The patient has had pain down the leg below the knee to the ankle, not including the foot.  He does not have any weakness of the left leg.  The pain has gone up into the back and up to the mid thoracic area just below the shoulder blade on the left.  The patient is sensitive to light touch on the thigh.  The pain at times is quite severe, particularly at night.  Use of Ultram has not been extremely effective, he has gone back up on the gabapentin taking 600 mg in the morning, 900 mg at midday and in the evening.  The patient was subjected to blood work, he was told he did not have lupus or rheumatoid arthritis.  The patient has not noted any change in bowel bladder control, his balance has been good.  He reports no neck pain or pain down the arms.  He comes to the office today for an evaluation.   Past Medical History:  Diagnosis Date   Cirrhosis of liver (HCC)    Diabetes mellitus without complication (HCC)    Vision abnormalities     Past Surgical History:  Procedure Laterality Date   SHOULDER ARTHROSCOPY Left age 49    Family History  Problem Relation Age  of Onset   Diabetes type II Mother    Heart disease Mother    Diabetes type II Father    Diabetes type II Sister     Social history:  reports that he has quit smoking. He has never used smokeless tobacco. He reports previous alcohol use. He reports previous drug use.   Not on File  Medications:  Prior to Admission medications   Medication Sig Start Date End Date Taking? Authorizing Provider  Efinaconazole 10 % SOLN Apply 1 drop topically daily. 11/26/17  Yes York Spaniel, MD  gabapentin (NEURONTIN) 300 MG capsule 2 in the am 3 in the afternoon and 3 in the evening 11/19/17  Yes [provider]  insulin regular (NOVOLIN R,HUMULIN R) 100 units/mL injection Inject into the skin 3 (three) times daily before meals.   Yes [provider]  metFORMIN (GLUCOPHAGE) 1000 MG tablet Take 1,000 mg by mouth 2 (two) times daily. 10/06/17  Yes [provider]  traMADol (ULTRAM) 50 MG tablet Take by mouth every 6 (six) hours as needed.   Yes [provider]  carbamazepine (TEGRETOL) 200 MG tablet 1/2 tablet twice a day for 2 weeks, then take 1 tablet twice a day 03/30/18   York Spaniel, MD  pantoprazole (PROTONIX) 40 MG tablet Take 40 mg by mouth  every morning. 10/18/17   [provider]  VICTOZA 18 MG/3ML SOPN INJECT 1.2MG  INTO THE SKIN ONCE A DAY 11/18/17   [provider]    ROS:  Out of a complete 14 system review of symptoms, the patient complains only of the following symptoms, and all other reviewed systems are negative.  Left leg pain  Blood pressure 136/79, pulse 83, height 5\' 3"  (1.6 m), weight 202 lb 5 oz (91.8 kg).  Physical Exam  General: The patient is alert and cooperative at the time of the examination.  The patient is moderately obese.  Skin: No significant peripheral edema is noted.   Neurologic Exam  Mental status: The patient is alert and oriented x 3 at the time of the examination. The patient has apparent  normal recent and remote memory, with an apparently normal attention span and concentration ability.   Cranial nerves: Facial symmetry is present. Speech is normal, no aphasia or dysarthria is noted. Extraocular movements are full. Visual fields are full.  Motor: The patient has good strength in all 4 extremities.  Sensory examination: Soft touch sensation is symmetric on the face, arms, and legs, with exception some slight decrease in the lateral aspect of the left thigh.  Coordination: The patient has good finger-nose-finger and heel-to-shin bilaterally.  Gait and station: The patient has a normal gait. Tandem gait is normal. Romberg is negative. No drift is seen.  The patient is able to walk on the heels and the toes bilaterally.  Reflexes: Deep tendon reflexes are symmetric, with exception that there is a slight reflex on the right knee, absent on the left, ankle jerk reflexes are absent bilaterally, reflexes in the arms are depressed but symmetric.  Toes are downgoing bilaterally.   Assessment/Plan:   1.  Diabetes  2.  Diabetic radiculitis, left T11  3.  New onset left mid back and low back, left leg discomfort  The etiology of his new symptoms is not clear.  The patient clearly has pain outside the distribution of a single nerve root.  The patient could potentially have a very low-grade diabetic amyotrophy, he just had MRI of the thoracic spine it was completely normal.  The patient will be set up for nerve conduction studies of both legs, EMG on the left leg.  Blood work will be done today.  He will be placed on carbamazepine taking 1/2 tablet twice daily for 2 weeks and then go to 1 full tablet twice daily.  We will follow-up in 4 months.  Marlan Palau MD 03/30/2018 4:25 PM  [facility] [address] [place], [place] [address]  Phone [phone] Fax [phone]

## 2018-03-30 NOTE — Patient Instructions (Signed)
We will start carbamazepine along with the gabapentin for the nerve pain.  Tegretol (carbamazepine) may result in dizziness, gait instability, cognitive slowing, or drowsiness. Sometimes, and allergic rash may occur, or a photosensitive rash may occur. If any significant side effects are noted, please contact our office.

## 2018-03-31 ENCOUNTER — Telehealth: Payer: Self-pay | Admitting: Neurology

## 2018-03-31 DIAGNOSIS — R0789 Other chest pain: Secondary | ICD-10-CM

## 2018-03-31 LAB — COMPREHENSIVE METABOLIC PANEL
ALT: 57 IU/L — ABNORMAL HIGH (ref 0–44)
AST: 48 IU/L — ABNORMAL HIGH (ref 0–40)
Albumin/Globulin Ratio: 1.5 (ref 1.2–2.2)
Albumin: 4 g/dL (ref 4.0–5.0)
Alkaline Phosphatase: 129 IU/L — ABNORMAL HIGH (ref 39–117)
BUN/Creatinine Ratio: 11 (ref 9–20)
BUN: 9 mg/dL (ref 6–24)
Bilirubin Total: 0.4 mg/dL (ref 0.0–1.2)
CO2: 23 mmol/L (ref 20–29)
Calcium: 9.2 mg/dL (ref 8.7–10.2)
Chloride: 101 mmol/L (ref 96–106)
Creatinine, Ser: 0.79 mg/dL (ref 0.76–1.27)
GFR calc Af Amer: 124 mL/min/{1.73_m2} (ref >59)
GFR calc non Af Amer: 107 mL/min/{1.73_m2} (ref >59)
Globulin, Total: 2.6 g/dL (ref 1.5–4.5)
Glucose: 253 mg/dL — ABNORMAL HIGH (ref 65–99)
Potassium: 3.9 mmol/L (ref 3.5–5.2)
Sodium: 140 mmol/L (ref 134–144)
Total Protein: 6.6 g/dL (ref 6.0–8.5)

## 2018-03-31 LAB — PAN-ANCA
ANCA Proteinase 3: <3.5 U/mL (ref 0.0–3.5)
Atypical pANCA: <1:20 {titer}
C-ANCA: <1:20 {titer}
Myeloperoxidase Ab: <9 U/mL (ref 0.0–9.0)
P-ANCA: <1:20 {titer}

## 2018-03-31 LAB — B. BURGDORFI ANTIBODIES: Lyme IgG/IgM Ab: <0.91 {ISR} (ref 0.00–0.90)

## 2018-03-31 LAB — ANGIOTENSIN CONVERTING ENZYME: Angio Convert Enzyme: 130 U/L — ABNORMAL HIGH (ref 14–82)

## 2018-03-31 LAB — ANA W/REFLEX: Anti Nuclear Antibody (ANA): NEGATIVE

## 2018-03-31 LAB — SEDIMENTATION RATE: Sed Rate: 47 mm/hr — ABNORMAL HIGH (ref 0–15)

## 2018-03-31 NOTE — Telephone Encounter (Signed)
I called the patient.  The blood work shows an elevated sedimentation rate, elevated liver enzymes, the patient does have a fatty liver.  The angiotensin-converting enzyme level was elevated, this could be associated with the diabetes or the liver problem.  If the patient has not had a recent chest x-ray, I would get 1 to exclude this is a potential possibility for his new pain complaints.  He will call me if he has not had a recent chest x-ray.

## 2018-04-01 ENCOUNTER — Other Ambulatory Visit: Payer: Self-pay

## 2018-04-01 ENCOUNTER — Ambulatory Visit
Admission: RE | Admit: 2018-04-01 | Discharge: 2018-04-01 | Disposition: A | Discharge status: Home / Self Care | Payer: BLUE CROSS/BLUE SHIELD | Source: Ambulatory Visit | Attending: Neurology | Admitting: Neurology

## 2018-04-01 DIAGNOSIS — R0789 Other chest pain: Secondary | ICD-10-CM

## 2018-04-01 NOTE — Addendum Note (Signed)
Addended by: York Spaniel on: 04/01/2018 11:51 AM   Modules accepted: Orders

## 2018-04-01 NOTE — Telephone Encounter (Signed)
I called the patient.  I went over the blood work results with him, we will check a chest x-ray to exclude sarcoidosis.  The angiotensin-converting enzyme level elevation could be related to the liver disease and diabetes.

## 2018-04-01 NOTE — Telephone Encounter (Signed)
Pt returned your call to advise he has not had a recent xray. He will go sometime next week.

## 2018-04-04 ENCOUNTER — Telehealth: Payer: Self-pay | Admitting: Neurology

## 2018-04-04 NOTE — Telephone Encounter (Signed)
I called the patient.  The chest x-ray is normal, the elevation in the angiotensin-converting enzyme level may be related to liver disease and diabetes.   CXR 04/01/18:  IMPRESSION: Normal exam.

## 2018-04-12 ENCOUNTER — Other Ambulatory Visit: Payer: Self-pay | Admitting: Gastroenterology

## 2018-04-12 DIAGNOSIS — R748 Abnormal levels of other serum enzymes: Secondary | ICD-10-CM

## 2018-04-12 DIAGNOSIS — R932 Abnormal findings on diagnostic imaging of liver and biliary tract: Secondary | ICD-10-CM

## 2018-04-12 DIAGNOSIS — R109 Unspecified abdominal pain: Secondary | ICD-10-CM

## 2018-04-18 ENCOUNTER — Other Ambulatory Visit: Payer: Self-pay

## 2018-04-18 ENCOUNTER — Ambulatory Visit
Admission: RE | Admit: 2018-04-18 | Discharge: 2018-04-18 | Disposition: A | Discharge status: Home / Self Care | Payer: BLUE CROSS/BLUE SHIELD | Source: Ambulatory Visit | Attending: Gastroenterology | Admitting: Gastroenterology

## 2018-04-18 DIAGNOSIS — R109 Unspecified abdominal pain: Secondary | ICD-10-CM

## 2018-04-18 DIAGNOSIS — R748 Abnormal levels of other serum enzymes: Secondary | ICD-10-CM

## 2018-04-18 DIAGNOSIS — R932 Abnormal findings on diagnostic imaging of liver and biliary tract: Secondary | ICD-10-CM

## 2018-05-31 ENCOUNTER — Other Ambulatory Visit: Payer: Self-pay | Admitting: Family Medicine

## 2018-05-31 DIAGNOSIS — R1012 Left upper quadrant pain: Secondary | ICD-10-CM

## 2018-06-20 ENCOUNTER — Encounter: Payer: BLUE CROSS/BLUE SHIELD | Admitting: Neurology

## 2018-06-20 ENCOUNTER — Telehealth: Payer: Self-pay | Admitting: Neurology

## 2018-06-20 NOTE — Telephone Encounter (Signed)
This patient did not show for an EMG nerve conduction study appointment today.

## 2018-07-25 ENCOUNTER — Ambulatory Visit (INDEPENDENT_AMBULATORY_CARE_PROVIDER_SITE_OTHER): Payer: BC Managed Care – PPO | Admitting: Neurology

## 2018-07-25 ENCOUNTER — Other Ambulatory Visit: Payer: Self-pay

## 2018-07-25 ENCOUNTER — Encounter: Payer: Self-pay | Admitting: Neurology

## 2018-07-25 DIAGNOSIS — M5410 Radiculopathy, site unspecified: Secondary | ICD-10-CM

## 2018-07-25 DIAGNOSIS — M545 Low back pain: Secondary | ICD-10-CM

## 2018-07-25 DIAGNOSIS — M791 Myalgia, unspecified site: Secondary | ICD-10-CM | POA: Insufficient documentation

## 2018-07-25 DIAGNOSIS — H532 Diplopia: Secondary | ICD-10-CM

## 2018-07-25 MED ORDER — DULOXETINE HCL 30 MG PO CPEP: ORAL_CAPSULE | ORAL | 3 refills | Status: DC

## 2018-07-25 NOTE — Procedures (Signed)
     HISTORY:  Hunter Gray is a 48 year old gentleman with a history of diabetes who initially presented with pain spontaneously down the left leg to the ankle.  This resolved within the last couple months but he now has pain in the intrascapular area going around the chest bilaterally and some left lower back pain.  The patient has been evaluated for this issue.  NERVE CONDUCTION STUDIES:  Nerve conduction studies were performed on both lower extremies. The distal motor latencies and motor amplitudes for the peroneal and posterior tibial nerves were within normal limits. The nerve conduction velocities for these nerves were also normal. The sensory latencies for the peroneal and sural nerves were within normal limits, with exception that the left peroneal sensory latency was prolonged. The F wave latencies for the posterior tibial nerves were within normal limits.   EMG STUDIES:  EMG study was performed on the left lower extremity:  The tibialis anterior muscle reveals 2 to 4K motor units with full recruitment. No fibrillations or positive waves were seen. The peroneus tertius muscle reveals 2 to 4K motor units with full recruitment. No fibrillations or positive waves were seen. The medial gastrocnemius muscle reveals 1 to 3K motor units with full recruitment. No fibrillations or positive waves were seen. The vastus lateralis muscle reveals 2 to 4K motor units with full recruitment. No fibrillations or positive waves were seen. The iliopsoas muscle reveals 2 to 4K motor units with full recruitment. No fibrillations or positive waves were seen. The biceps femoris muscle (long head) reveals 2 to 4K motor units with full recruitment. 2+ positive waves were seen. The lumbosacral paraspinal muscles were tested at 3 levels, and revealed no abnormalities of insertional activity at all 3 levels tested. There was good relaxation.   IMPRESSION:  Nerve conduction studies done on both lower  extremities were unremarkable with exception of an isolated prolongation of the left peroneal sensory latency.  No evidence of an overt diabetic neuropathy is seen.  EMG evaluation of the left lower extremity was unremarkable with exception of isolated acute denervation in the biceps femoris muscle, the clinical significance of this is not clear.  No clear evidence of an overlying lumbosacral radiculopathy or diabetic amyotrophy can be diagnosed by the study.  Jill Alexanders MD 07/25/2018 4:17 PM  [facility] [address] [place], [place] [address]  Phone [phone] Fax [phone]

## 2018-07-25 NOTE — Progress Notes (Signed)
Please refer to EMG and nerve conduction procedure note.  

## 2018-07-25 NOTE — Progress Notes (Addendum)
The patient comes in for EMG and nerve conduction study.  He claims that the left leg pain resolved within 2 months, but he has now had migratory pain involving the intrascapular area, and left lower back.  He has soreness of the muscles.  Nerve conduction studies does not show a significant diabetic neuropathy.  EMG study shows only isolated acute denervation of the hamstring muscle on the left, the clinical significance of this is not clear.  Further blood work will be done today.  Cymbalta will be added to the medication regimen along with the gabapentin to control the pain.  The pain level still poorly controlled.      [place]    Nerve / Sites Muscle Latency Ref. Amplitude Ref. Rel Amp Segments Distance Velocity Ref. Area    ms ms mV mV %  cm m/s m/s mVms  L Peroneal - EDB     Ankle EDB 4.8 ?6.5 4.8 ?2.0 100 Ankle - EDB 9   15.0     Fib head EDB 10.4  5.0  105 Fib head - Ankle 25 45 ?44 16.8     Pop fossa EDB 12.2  6.3  127 Pop fossa - Fib head 10 56 ?44 20.6         Pop fossa - Ankle      R Peroneal - EDB     Ankle EDB 4.6 ?6.5 3.7 ?2.0 100 Ankle - EDB 9   11.5     Fib head EDB 10.1  3.9  104 Fib head - Ankle 25 46 ?44 12.4     Pop fossa EDB 12.3  4.4  114 Pop fossa - Fib head 10 45 ?44 13.7         Pop fossa - Ankle      L Tibial - AH     Ankle AH 3.9 ?5.8 10.4 ?4.0 100 Ankle - AH 9   22.5     Pop fossa AH 11.3  8.9  86.2 Pop fossa - Ankle 31 42 ?41 20.2  R Tibial - AH     Ankle AH 3.6 ?5.8 7.2 ?4.0 100 Ankle - AH 9   14.9     Pop fossa AH 11.4  6.5  90 Pop fossa - Ankle 32 41 ?41 14.0             SNC    Nerve / Sites Rec. Site Peak Lat Ref.  Amp Ref. Segments Distance    ms ms V V  cm  L Sural - Ankle (Calf)     Calf Ankle 3.6 ?4.4 5 ?6 Calf - Ankle 14  R Sural - Ankle (Calf)     Calf Ankle 3.6 ?4.4 4 ?6 Calf - Ankle 14  L Superficial peroneal - Ankle     Lat leg Ankle 5.2 ?4.4 2 ?6 Lat leg - Ankle 14  R Superficial peroneal - Ankle     Lat leg Ankle 4.4 ?4.4 2 ?6 Lat  leg - Ankle 14              F  Wave    Nerve F Lat Ref.   ms ms  L Tibial - AH 48.6 ?56.0  R Tibial - AH 49.3 ?56.0

## 2018-07-27 LAB — VITAMIN B12: Vitamin B-12: 579 pg/mL (ref 232–1245)

## 2018-07-27 LAB — TSH: TSH: 0.96 u[IU]/mL (ref 0.450–4.500)

## 2018-07-27 LAB — RHEUMATOID FACTOR: Rhuematoid fact SerPl-aCnc: <10 IU/mL (ref 0.0–13.9)

## 2018-07-27 LAB — ACETYLCHOLINE RECEPTOR, BINDING: AChR Binding Ab, Serum: 0.04 nmol/L (ref 0.00–0.24)

## 2018-07-27 LAB — SEDIMENTATION RATE: Sed Rate: 33 mm/hr — ABNORMAL HIGH (ref 0–15)

## 2018-07-27 LAB — CK: Total CK: 274 U/L (ref 49–439)

## 2018-08-01 ENCOUNTER — Telehealth: Payer: Self-pay

## 2018-08-01 NOTE — Telephone Encounter (Signed)
I called pt that per DR. Jannifer Franklin he can Hunter Roch NP for his appt. Pt stated he was driving and will call back tomorrow and schedule a 3 month follow up with Hunter Roch NP.

## 2018-08-01 NOTE — Telephone Encounter (Signed)
I was calling about lab work results. Pt wanted to schedule a 3 month follow up appt with Dr. Jannifer Franklin. Patient had an office visit in March 2020. Rn could not find an available slot for pt in the next  3 months for regular office visit. I advise pt to call back to see if Dr. Jannifer Franklin has an cancellations. Pt verbalized understanding.

## 2018-08-01 NOTE — Telephone Encounter (Signed)
Notes recorded by Marval Regal, RN on 08/01/2018 at 1:47 PM EDT  I called pt about his lab work results. I stated per Dr. Jannifer Franklin Blood work is unremarkable with exception that the sedimentation rate remains elevated but is improved from 3 months ago. PT verbalized understanding.  ------

## 2018-08-01 NOTE — Telephone Encounter (Signed)
-----   Message from Kathrynn Ducking, MD sent at 07/27/2018  4:48 PM EDT ----- Blood work is unremarkable with exception that the sedimentation rate remains elevated but is improved from 3 months ago.  Please call the patient. ----- Message ----- From: Lavone Neri Lab Results In Sent: 07/26/2018   7:37 AM EDT To: Kathrynn Ducking, MD

## 2018-08-01 NOTE — Telephone Encounter (Signed)
The patient has migratory pain of unclear etiology, could be related to fibromyalgia potentially.  I will prefer the patient to see me but if he cannot be seen seen otherwise, okay to see nurse practitioner.

## 2018-10-10 ENCOUNTER — Other Ambulatory Visit: Payer: Self-pay | Admitting: Gastroenterology

## 2018-10-10 DIAGNOSIS — K7460 Unspecified cirrhosis of liver: Secondary | ICD-10-CM

## 2018-10-12 ENCOUNTER — Other Ambulatory Visit: Payer: BLUE CROSS/BLUE SHIELD

## 2018-10-17 ENCOUNTER — Ambulatory Visit
Admission: RE | Admit: 2018-10-17 | Discharge: 2018-10-17 | Disposition: A | Discharge status: Home / Self Care | Payer: BC Managed Care – PPO | Source: Ambulatory Visit | Attending: Gastroenterology | Admitting: Gastroenterology

## 2018-10-17 DIAGNOSIS — K7460 Unspecified cirrhosis of liver: Secondary | ICD-10-CM

## 2018-10-21 ENCOUNTER — Other Ambulatory Visit (HOSPITAL_COMMUNITY): Payer: Self-pay | Admitting: Gastroenterology

## 2018-10-21 ENCOUNTER — Other Ambulatory Visit: Payer: Self-pay | Admitting: Gastroenterology

## 2018-10-21 DIAGNOSIS — R1011 Right upper quadrant pain: Secondary | ICD-10-CM

## 2018-11-07 ENCOUNTER — Encounter (HOSPITAL_COMMUNITY): Payer: Self-pay

## 2018-11-07 ENCOUNTER — Ambulatory Visit (HOSPITAL_COMMUNITY): Admission: RE | Admit: 2018-11-07 | Payer: BC Managed Care – PPO | Source: Ambulatory Visit

## 2018-11-21 ENCOUNTER — Encounter (HOSPITAL_COMMUNITY)
Admission: RE | Admit: 2018-11-21 | Discharge: 2018-11-21 | Disposition: A | Discharge status: Home / Self Care | Payer: BC Managed Care – PPO | Source: Ambulatory Visit | Attending: Gastroenterology | Admitting: Gastroenterology

## 2018-11-21 ENCOUNTER — Other Ambulatory Visit: Payer: Self-pay

## 2018-11-21 DIAGNOSIS — R1011 Right upper quadrant pain: Secondary | ICD-10-CM | POA: Insufficient documentation

## 2018-11-22 ENCOUNTER — Ambulatory Visit: Payer: BC Managed Care – PPO | Admitting: Skilled Nursing Facility1

## 2019-04-07 ENCOUNTER — Other Ambulatory Visit: Payer: Self-pay | Admitting: Gastroenterology

## 2019-04-07 DIAGNOSIS — K7460 Unspecified cirrhosis of liver: Secondary | ICD-10-CM

## 2019-04-12 ENCOUNTER — Ambulatory Visit
Admission: RE | Admit: 2019-04-12 | Discharge: 2019-04-12 | Disposition: A | Discharge status: Home / Self Care | Payer: BC Managed Care – PPO | Source: Ambulatory Visit | Attending: Gastroenterology | Admitting: Gastroenterology

## 2019-04-12 DIAGNOSIS — K7460 Unspecified cirrhosis of liver: Secondary | ICD-10-CM

## 2019-05-14 ENCOUNTER — Other Ambulatory Visit: Payer: Self-pay

## 2019-05-14 ENCOUNTER — Encounter (HOSPITAL_COMMUNITY)
Admission: EM | Disposition: A | Discharge status: Home / Self Care | Payer: Self-pay | Source: Home / Self Care | Attending: Internal Medicine

## 2019-05-14 ENCOUNTER — Inpatient Hospital Stay (HOSPITAL_COMMUNITY): Payer: BC Managed Care – PPO | Admitting: Anesthesiology

## 2019-05-14 ENCOUNTER — Inpatient Hospital Stay (HOSPITAL_COMMUNITY)
Admission: EM | Admit: 2019-05-14 | Discharge: 2019-05-19 | DRG: 432 | Disposition: A | Discharge status: Home / Self Care | Payer: BC Managed Care – PPO | Attending: Internal Medicine | Admitting: Internal Medicine

## 2019-05-14 ENCOUNTER — Encounter (HOSPITAL_COMMUNITY): Payer: Self-pay | Admitting: Emergency Medicine

## 2019-05-14 DIAGNOSIS — I959 Hypotension, unspecified: Secondary | ICD-10-CM | POA: Diagnosis present

## 2019-05-14 DIAGNOSIS — K7460 Unspecified cirrhosis of liver: Secondary | ICD-10-CM

## 2019-05-14 DIAGNOSIS — K766 Portal hypertension: Secondary | ICD-10-CM | POA: Diagnosis present

## 2019-05-14 DIAGNOSIS — E119 Type 2 diabetes mellitus without complications: Secondary | ICD-10-CM

## 2019-05-14 DIAGNOSIS — D696 Thrombocytopenia, unspecified: Secondary | ICD-10-CM | POA: Diagnosis present

## 2019-05-14 DIAGNOSIS — R52 Pain, unspecified: Secondary | ICD-10-CM | POA: Diagnosis not present

## 2019-05-14 DIAGNOSIS — K920 Hematemesis: Secondary | ICD-10-CM | POA: Diagnosis not present

## 2019-05-14 DIAGNOSIS — I8511 Secondary esophageal varices with bleeding: Secondary | ICD-10-CM | POA: Diagnosis present

## 2019-05-14 DIAGNOSIS — Z79899 Other long term (current) drug therapy: Secondary | ICD-10-CM

## 2019-05-14 DIAGNOSIS — K648 Other hemorrhoids: Secondary | ICD-10-CM | POA: Diagnosis present

## 2019-05-14 DIAGNOSIS — K922 Gastrointestinal hemorrhage, unspecified: Secondary | ICD-10-CM | POA: Diagnosis present

## 2019-05-14 DIAGNOSIS — R55 Syncope and collapse: Secondary | ICD-10-CM | POA: Diagnosis present

## 2019-05-14 DIAGNOSIS — Z8249 Family history of ischemic heart disease and other diseases of the circulatory system: Secondary | ICD-10-CM

## 2019-05-14 DIAGNOSIS — K3189 Other diseases of stomach and duodenum: Secondary | ICD-10-CM | POA: Diagnosis present

## 2019-05-14 DIAGNOSIS — Z794 Long term (current) use of insulin: Secondary | ICD-10-CM | POA: Diagnosis not present

## 2019-05-14 DIAGNOSIS — D500 Iron deficiency anemia secondary to blood loss (chronic): Secondary | ICD-10-CM | POA: Diagnosis not present

## 2019-05-14 DIAGNOSIS — Z833 Family history of diabetes mellitus: Secondary | ICD-10-CM | POA: Diagnosis not present

## 2019-05-14 DIAGNOSIS — Z20822 Contact with and (suspected) exposure to covid-19: Secondary | ICD-10-CM | POA: Diagnosis present

## 2019-05-14 DIAGNOSIS — K7030 Alcoholic cirrhosis of liver without ascites: Secondary | ICD-10-CM | POA: Diagnosis not present

## 2019-05-14 DIAGNOSIS — K254 Chronic or unspecified gastric ulcer with hemorrhage: Secondary | ICD-10-CM | POA: Diagnosis present

## 2019-05-14 DIAGNOSIS — Z87891 Personal history of nicotine dependence: Secondary | ICD-10-CM | POA: Diagnosis not present

## 2019-05-14 HISTORY — PX: ESOPHAGOGASTRODUODENOSCOPY (EGD) WITH PROPOFOL: SHX5813

## 2019-05-14 HISTORY — PX: ESOPHAGEAL BANDING: SHX5518

## 2019-05-14 LAB — COMPREHENSIVE METABOLIC PANEL
ALT: 63 U/L — ABNORMAL HIGH (ref 0–44)
AST: 54 U/L — ABNORMAL HIGH (ref 15–41)
Albumin: 3.9 g/dL (ref 3.5–5.0)
Alkaline Phosphatase: 71 U/L (ref 38–126)
Anion gap: 11 (ref 5–15)
BUN: 31 mg/dL — ABNORMAL HIGH (ref 6–20)
CO2: 24 mmol/L (ref 22–32)
Calcium: 8.8 mg/dL — ABNORMAL LOW (ref 8.9–10.3)
Chloride: 105 mmol/L (ref 98–111)
Creatinine, Ser: 0.83 mg/dL (ref 0.61–1.24)
GFR calc Af Amer: >60 mL/min (ref >60)
GFR calc non Af Amer: >60 mL/min (ref >60)
Glucose, Bld: 187 mg/dL — ABNORMAL HIGH (ref 70–99)
Potassium: 3.9 mmol/L (ref 3.5–5.1)
Sodium: 140 mmol/L (ref 135–145)
Total Bilirubin: 1.3 mg/dL — ABNORMAL HIGH (ref 0.3–1.2)
Total Protein: 6.7 g/dL (ref 6.5–8.1)

## 2019-05-14 LAB — GLUCOSE, CAPILLARY
Glucose-Capillary: 160 mg/dL — ABNORMAL HIGH (ref 70–99)
Glucose-Capillary: 149 mg/dL — ABNORMAL HIGH (ref 70–99)
Glucose-Capillary: 127 mg/dL — ABNORMAL HIGH (ref 70–99)
Glucose-Capillary: 135 mg/dL — ABNORMAL HIGH (ref 70–99)
Glucose-Capillary: 247 mg/dL — ABNORMAL HIGH (ref 70–99)

## 2019-05-14 LAB — HEMOGLOBIN A1C
Hgb A1c MFr Bld: 6.9 % — ABNORMAL HIGH (ref 4.8–5.6)
Mean Plasma Glucose: 151.33 mg/dL

## 2019-05-14 LAB — CBC
HCT: 39.6 % (ref 39.0–52.0)
Hemoglobin: 13.4 g/dL (ref 13.0–17.0)
MCH: 33.3 pg (ref 26.0–34.0)
MCHC: 33.8 g/dL (ref 30.0–36.0)
MCV: 98.3 fL (ref 80.0–100.0)
Platelets: 149 10*3/uL — ABNORMAL LOW (ref 150–400)
RBC: 4.03 MIL/uL — ABNORMAL LOW (ref 4.22–5.81)
RDW: 13.3 % (ref 11.5–15.5)
WBC: 11.2 10*3/uL — ABNORMAL HIGH (ref 4.0–10.5)
nRBC: 0 % (ref 0.0–0.2)

## 2019-05-14 LAB — RESPIRATORY PANEL BY RT PCR (FLU A&B, COVID)
Influenza A by PCR: NEGATIVE
Influenza B by PCR: NEGATIVE
SARS Coronavirus 2 by RT PCR: NEGATIVE

## 2019-05-14 LAB — TYPE AND SCREEN
ABO/RH(D): B POS
Antibody Screen: NEGATIVE

## 2019-05-14 LAB — ABO/RH: ABO/RH(D): B POS

## 2019-05-14 LAB — CBG MONITORING, ED: Glucose-Capillary: 188 mg/dL — ABNORMAL HIGH (ref 70–99)

## 2019-05-14 LAB — HEMOGLOBIN AND HEMATOCRIT, BLOOD
HCT: 32.1 % — ABNORMAL LOW (ref 39.0–52.0)
HCT: 30.4 % — ABNORMAL LOW (ref 39.0–52.0)
HCT: 31.5 % — ABNORMAL LOW (ref 39.0–52.0)
Hemoglobin: 10.9 g/dL — ABNORMAL LOW (ref 13.0–17.0)
Hemoglobin: 10.3 g/dL — ABNORMAL LOW (ref 13.0–17.0)
Hemoglobin: 10.6 g/dL — ABNORMAL LOW (ref 13.0–17.0)

## 2019-05-14 LAB — MRSA PCR SCREENING: MRSA by PCR: NEGATIVE

## 2019-05-14 LAB — POC OCCULT BLOOD, ED: Fecal Occult Bld: POSITIVE — AB

## 2019-05-14 LAB — PROTIME-INR
INR: 1.3 — ABNORMAL HIGH (ref 0.8–1.2)
Prothrombin Time: 15.9 seconds — ABNORMAL HIGH (ref 11.4–15.2)

## 2019-05-14 LAB — APTT: aPTT: 28 seconds (ref 24–36)

## 2019-05-14 LAB — ETHANOL: Alcohol, Ethyl (B): <10 mg/dL (ref <10)

## 2019-05-14 LAB — HIV ANTIBODY (ROUTINE TESTING W REFLEX): HIV Screen 4th Generation wRfx: NONREACTIVE

## 2019-05-14 SURGERY — ESOPHAGOGASTRODUODENOSCOPY (EGD) WITH PROPOFOL
Anesthesia: Monitor Anesthesia Care

## 2019-05-14 MED ORDER — MELATONIN 5 MG PO TABS
10.0000 mg | ORAL_TABLET | Freq: Once | ORAL | Status: AC
  Administered 2019-05-14: 10 mg via ORAL
  Filled 2019-05-14: qty 2

## 2019-05-14 MED ORDER — SODIUM CHLORIDE 0.9 % IV SOLN
50.0000 ug/h | INTRAVENOUS | Status: DC
  Administered 2019-05-14 – 2019-05-15 (×3): 50 ug/h via INTRAVENOUS
  Filled 2019-05-14 (×6): qty 1

## 2019-05-14 MED ORDER — PANTOPRAZOLE SODIUM 40 MG IV SOLR
40.0000 mg | Freq: Two times a day (BID) | INTRAVENOUS | Status: DC
  Administered 2019-05-14 (×2): 40 mg via INTRAVENOUS
  Filled 2019-05-14 (×2): qty 40

## 2019-05-14 MED ORDER — INSULIN ASPART 100 UNIT/ML ~~LOC~~ SOLN
0.0000 [IU] | SUBCUTANEOUS | Status: DC
  Administered 2019-05-14 (×2): 1 [IU] via SUBCUTANEOUS
  Administered 2019-05-14: 3 [IU] via SUBCUTANEOUS
  Administered 2019-05-14: 2 [IU] via SUBCUTANEOUS
  Administered 2019-05-14: 1 [IU] via SUBCUTANEOUS
  Administered 2019-05-15: 2 [IU] via SUBCUTANEOUS
  Administered 2019-05-15: 1 [IU] via SUBCUTANEOUS
  Filled 2019-05-14: qty 0.09

## 2019-05-14 MED ORDER — LIDOCAINE 2% (20 MG/ML) 5 ML SYRINGE
INTRAMUSCULAR | Status: DC | PRN
  Administered 2019-05-14: 80 mg via INTRAVENOUS

## 2019-05-14 MED ORDER — SODIUM CHLORIDE 0.9 % IV BOLUS
1000.0000 mL | Freq: Once | INTRAVENOUS | Status: AC
  Administered 2019-05-14: 1000 mL via INTRAVENOUS

## 2019-05-14 MED ORDER — ONDANSETRON HCL 4 MG/2ML IJ SOLN
4.0000 mg | Freq: Once | INTRAMUSCULAR | Status: AC
  Administered 2019-05-14: 4 mg via INTRAVENOUS
  Filled 2019-05-14: qty 2

## 2019-05-14 MED ORDER — PHENYLEPHRINE 40 MCG/ML (10ML) SYRINGE FOR IV PUSH (FOR BLOOD PRESSURE SUPPORT)
PREFILLED_SYRINGE | INTRAVENOUS | Status: DC | PRN
  Administered 2019-05-14 (×3): 80 ug via INTRAVENOUS

## 2019-05-14 MED ORDER — METOCLOPRAMIDE HCL 5 MG/ML IJ SOLN
10.0000 mg | Freq: Once | INTRAMUSCULAR | Status: AC
  Administered 2019-05-14: 10 mg via INTRAVENOUS
  Filled 2019-05-14: qty 2

## 2019-05-14 MED ORDER — ONDANSETRON HCL 4 MG/2ML IJ SOLN
4.0000 mg | Freq: Four times a day (QID) | INTRAMUSCULAR | Status: DC | PRN
  Administered 2019-05-14: 4 mg via INTRAVENOUS
  Filled 2019-05-14: qty 2

## 2019-05-14 MED ORDER — PROPOFOL 500 MG/50ML IV EMUL
INTRAVENOUS | Status: DC | PRN
  Administered 2019-05-14: 40 mg via INTRAVENOUS
  Administered 2019-05-14: 80 mg via INTRAVENOUS
  Administered 2019-05-14: 20 mg via INTRAVENOUS
  Administered 2019-05-14: 40 mg via INTRAVENOUS

## 2019-05-14 MED ORDER — SODIUM CHLORIDE 0.9 % IV SOLN
8.0000 mg/h | INTRAVENOUS | Status: DC
  Administered 2019-05-14: 8 mg/h via INTRAVENOUS
  Filled 2019-05-14 (×2): qty 80

## 2019-05-14 MED ORDER — LACTATED RINGERS IV SOLN: INTRAVENOUS | Status: DC | PRN

## 2019-05-14 MED ORDER — SODIUM CHLORIDE 0.9 % IV SOLN: INTRAVENOUS | Status: AC

## 2019-05-14 MED ORDER — MORPHINE SULFATE (PF) 4 MG/ML IV SOLN
4.0000 mg | Freq: Once | INTRAVENOUS | Status: AC
  Administered 2019-05-14: 4 mg via INTRAVENOUS
  Filled 2019-05-14: qty 1

## 2019-05-14 MED ORDER — CHLORHEXIDINE GLUCONATE CLOTH 2 % EX PADS
6.0000 | MEDICATED_PAD | Freq: Every day | CUTANEOUS | Status: DC
  Administered 2019-05-14 – 2019-05-18 (×7): 6 via TOPICAL

## 2019-05-14 MED ORDER — MORPHINE SULFATE (PF) 2 MG/ML IV SOLN
1.0000 mg | Freq: Once | INTRAVENOUS | Status: AC
  Administered 2019-05-14: 1 mg via INTRAVENOUS
  Filled 2019-05-14: qty 1

## 2019-05-14 MED ORDER — OCTREOTIDE LOAD VIA INFUSION
50.0000 ug | Freq: Once | INTRAVENOUS | Status: DC
  Filled 2019-05-14: qty 25

## 2019-05-14 MED ORDER — SODIUM CHLORIDE 0.9 % IV SOLN
80.0000 mg | Freq: Once | INTRAVENOUS | Status: AC
  Administered 2019-05-14: 80 mg via INTRAVENOUS
  Filled 2019-05-14: qty 80

## 2019-05-14 MED ORDER — SODIUM CHLORIDE 0.9 % IV SOLN
2.0000 g | Freq: Every day | INTRAVENOUS | Status: DC
  Administered 2019-05-14 – 2019-05-18 (×5): 2 g via INTRAVENOUS
  Filled 2019-05-14 (×3): qty 2
  Filled 2019-05-14 (×2): qty 20

## 2019-05-14 SURGICAL SUPPLY — 15 items

## 2019-05-14 NOTE — H&P (View-Only) (Signed)
[facility] Consult  Referring Provider: Dr.Pokharel/[facility] Primary Care Physician:  Farris Has, MD Primary Gastroenterologist: Dr.Brahmbhatt/[facility]  Reason for Consultation: Vomiting blood and black stools  HPI: Hunter Gray is a 49 y.o. male with history of alcohol related cirrhosis, diagnosed in 2019, presented to the ED with complaints of multiple episodes of hematemesis and two episodes of melena. Patient states he was in his usual state of health until yesterday afternoon, when he started vomiting, maroon and dark brown-colored fluid.  He describes at least 5-6 of those episodes followed by two episodes of liquid black stools.  This was not associated with shortness of breath, chest pain, dizziness or loss of consciousness. Patient denies abdominal pain. No prior history of vomiting blood or black stools.  Last episode of vomiting blood was at 2 AM after admission. He has not required any blood transfusion and has been started on IV octreotide and IV Protonix drip.  Patient had an EGD in 12/19 which showed grade 2 esophageal varices and has been on nadolol 20 mg daily. Prior to that EGD from 10/19 showed grade 1 esophageal varices.  Last colonoscopy was performed in 12/2017, four tubular adenomas were removed, biopsies were negative for microscopic colitis.  He was also noted to have internal hemorrhoids.  Patient states his last alcohol use was in July 2019.  Patient took 2 pills of Aleve on Saturday, but denies regular use of NSAIDs aspirin.  Patient denies swelling of abdomen, swelling of extremities, confusion or prior history of encephalopathy.  Past Medical History:  Diagnosis Date  . Cirrhosis of liver (HCC)   . Diabetes mellitus without complication (HCC)   . Vision abnormalities     Past Surgical History:  Procedure Laterality Date  . SHOULDER ARTHROSCOPY Left age 51    Prior to Admission medications   Medication Sig Start  Date End Date Taking? Authorizing Provider  fluticasone (FLONASE) 50 MCG/ACT nasal spray Place 1 spray into both nostrils daily as needed for allergies or rhinitis.   Yes [provider]  gabapentin (NEURONTIN) 600 MG tablet Take 600-1,200 mg by mouth See admin instructions. 600mg  in am, lunch and 1200mg  at hs 03/30/19  Yes [provider]  nadolol (CORGARD) 20 MG tablet Take 20 mg by mouth daily. 04/05/19  Yes [provider]  SYNJARDY XR 05-998 MG TB24 Take 2 tablets by mouth every morning. 04/28/19  Yes [provider]  traMADol (ULTRAM) 50 MG tablet Take 50 mg by mouth every 6 (six) hours as needed for moderate pain.    Yes [provider]  VICTOZA 18 MG/3ML SOPN Inject 1.8 mg into the skin daily.  11/18/17  Yes [provider]  carbamazepine (TEGRETOL) 200 MG tablet 1/2 tablet twice a day for 2 weeks, then take 1 tablet twice a day Patient not taking: Reported on 05/14/2019 03/30/18   07/14/2019, MD  DULoxetine (CYMBALTA) 30 MG capsule One tablet daily for 1 week, then take one twice daily Patient not taking: Reported on 05/14/2019 07/25/18   07/14/2019, MD  Efinaconazole 10 % SOLN Apply 1 drop topically daily. Patient not taking: Reported on 05/14/2019 11/26/17   07/14/2019, MD    Current Facility-Administered Medications  Medication Dose Route Frequency Provider Last Rate Last Admin  . 0.9 %  sodium chloride infusion   Intravenous Continuous 11/28/17, MD 150 mL/hr at 05/14/19 0900 Rate Verify at 05/14/19 0900  . cefTRIAXone (ROCEPHIN) 2 g in sodium chloride 0.9 %  100 mL IVPB  2 g Intravenous Q0600 John Giovanni, MD   Stopped at 05/14/19 [phone]  . Chlorhexidine Gluconate Cloth 2 % PADS 6 each  6 each Topical Daily Pokhrel, Laxman, MD   6 each at 05/14/19 0818  . insulin aspart (novoLOG) injection 0-9 Units  0-9 Units Subcutaneous Q4H John Giovanni, MD   1 Units at 05/14/19 0818  . metoCLOPramide (REGLAN)  injection 10 mg  10 mg Intravenous Once Kerin Salen, MD      . octreotide (SANDOSTATIN) 2 mcg/mL load via infusion 50 mcg  50 mcg Intravenous Once John Giovanni, MD       And  . octreotide (SANDOSTATIN) 500 mcg in sodium chloride 0.9 % 250 mL (2 mcg/mL) infusion  50 mcg/hr Intravenous Continuous John Giovanni, MD 25 mL/hr at 05/14/19 0555 50 mcg/hr at 05/14/19 0555  . ondansetron (ZOFRAN) injection 4 mg  4 mg Intravenous Q6H PRN John Giovanni, MD   4 mg at 05/14/19 4081  . pantoprazole (PROTONIX) 80 mg in sodium chloride 0.9 % 100 mL (0.8 mg/mL) infusion  8 mg/hr Intravenous Continuous John Giovanni, MD 10 mL/hr at 05/14/19 0900 8 mg/hr at 05/14/19 0900    Allergies as of 05/14/2019  . (No Known Allergies)    Family History  Problem Relation Age of Onset  . Diabetes type II Mother   . Heart disease Mother   . Diabetes type II Father   . Diabetes type II Sister     Social History   Socioeconomic History  . Marital status: Married    Spouse name: Not on file  . Number of children: Not on file  . Years of education: Not on file  . Highest education level: Not on file  Occupational History  . Not on file  Tobacco Use  . Smoking status: Former Games developer  . Smokeless tobacco: Never Used  Substance and Sexual Activity  . Alcohol use: Not Currently  . Drug use: Not Currently  . Sexual activity: Not on file  Other Topics Concern  . Not on file  Social History Narrative  . Not on file   Social Determinants of Health   Financial Resource Strain:   . Difficulty of Paying Living Expenses:   Food Insecurity:   . Worried About Programme researcher, broadcasting/film/video in the Last Year:   . Barista in the Last Year:   Transportation Needs:   . Freight forwarder (Medical):   Marland Kitchen Lack of Transportation (Non-Medical):   Physical Activity:   . Days of Exercise per Week:   . Minutes of Exercise per Session:   Stress:   . Feeling of Stress :   Social Connections:   .  Frequency of Communication with Friends and Family:   . Frequency of Social Gatherings with Friends and Family:   . Attends Religious Services:   . Active Member of Clubs or Organizations:   . Attends Banker Meetings:   Marland Kitchen Marital Status:   Intimate Partner Violence:   . Fear of Current or Ex-Partner:   . Emotionally Abused:   Marland Kitchen Physically Abused:   . Sexually Abused:     Review of Systems:  GI: Described in detail in HPI.    Gen: Denies any fever, chills, rigors, night sweats, anorexia, fatigue, weakness, malaise, involuntary weight loss, and sleep disorder CV: Denies chest pain, angina, palpitations, syncope, orthopnea, PND, peripheral edema, and claudication. Resp: Denies dyspnea, cough, sputum, wheezing, coughing up blood. GU : Denies  urinary burning, blood in urine, urinary frequency, urinary hesitancy, nocturnal urination, and urinary incontinence. MS: Denies joint pain or swelling.  Denies muscle weakness, cramps, atrophy.  Derm: Denies rash, itching, oral ulcerations, hives, unhealing ulcers.  Psych: Denies depression, anxiety, memory loss, suicidal ideation, hallucinations,  and confusion. Heme: Denies bruising and enlarged lymph nodes. Neuro:  Denies any headaches, dizziness, paresthesias. Endo:   DM,Denies any problems with thyroid, adrenal function.  Physical Exam: Vital signs in last 24 hours: Temp:  [97.9 F (36.6 C)-98.1 F (36.7 C)] 98.1 F (36.7 C) (05/09 0808) Pulse Rate:  [85-104] 89 (05/09 0817) Resp:  [13-27] 16 (05/09 0817) BP: (74-116)/(43-80) 104/53 (05/09 0817) SpO2:  [90 %-98 %] 98 % (05/09 0817) Weight:  [83.9 kg] 83.9 kg (05/09 0139) Last BM Date: 05/14/19  General:   Alert,  Well-developed, well-nourished, pleasant and cooperative in NAD Head:  Normocephalic and atraumatic. Eyes:  Sclera clear, no icterus.   Mild pallor Ears:  Normal auditory acuity. Nose:  No deformity, discharge,  or lesions. Mouth:  No deformity or lesions.   Oropharynx pink & moist. Neck:  Supple; no masses or thyromegaly. Lungs:  Clear throughout to auscultation.   No wheezes, crackles, or rhonchi. No acute distress. Heart:  Regular rate and rhythm; no murmurs, clicks, rubs,  or gallops. Extremities:  Without clubbing or edema. Neurologic:  Alert and  oriented x4;  grossly normal neurologically.  No asterixis. Skin:  Intact without significant lesions or rashes. Psych:  Alert and cooperative. Normal mood and affect. Abdomen:  Soft, nontender and nondistended. No masses, hepatosplenomegaly or hernias noted. Normal bowel sounds, without guarding, and without rebound.         Lab Results: Recent Labs    05/14/19 0145 05/14/19 0438 05/14/19 0832  WBC 11.2*  --   --   HGB 13.4 10.9* 10.3*  HCT 39.6 32.1* 30.4*  PLT 149*  --   --    BMET Recent Labs    05/14/19 0145  NA 140  K 3.9  CL 105  CO2 24  GLUCOSE 187*  BUN 31*  CREATININE 0.83  CALCIUM 8.8*   LFT Recent Labs    05/14/19 0145  PROT 6.7  ALBUMIN 3.9  AST 54*  ALT 63*  ALKPHOS 71  BILITOT 1.3*   PT/INR Recent Labs    05/14/19 0213  LABPROT 15.9*  INR 1.3*    Studies/Results: No results found.  Impression: Hematemesis and melena in a patient with history of alcohol-related cirrhosis and esophageal varices Bleeding most likely related to esophageal varices. Remains hemodynamically stable, hemoglobin is stable at 10.9/10.3, although on presentation hemoglobin was 13.4 Normal platelets 149 Elevated BUN/creatinine ratio of 31/0.83 compatible with an upper GI bleed T bili/AST/ALT/ALP of one-point 3/54/63/71 PT/INR 15.9/1.3 Meld sodium score is 10  No evidence of encephalopathy, ascites or pedal edema   Plan: Patient remains n.p.o., last meal was yesterday afternoon EGD with possible banding of esophageal varices planned now The risks and the benefits of the procedure were discussed with the patient in details. I have discussed with the patient and  his wife at bedside that if banding fails to control variceal bleeding, he may need TIPS for continued bleeding. Continue IV ceftriaxone, IV octreotide and IV Protonix Will give patient Reglan 10 mg IV x1 dose    LOS: 0 days   Ronnette Juniper, MD  05/14/2019, 9:25 AM

## 2019-05-14 NOTE — ED Provider Notes (Signed)
[facility] Provider Note   CSN: [id number] Arrival date & time: 05/14/19  0119     History Chief Complaint  Patient presents with  . Hematemesis  . Hypotension    Hunter Gray is a 49 y.o. male.  Patient with history of non-alcoholic cirrhosis, T2DM, presents with nausea and vomiting that started around 2:00 pm yesterday and has persisted until arrival in the ED. He reports his emesis appeared to have a small amount of blood initially and now appears dark. He reports his stool has also become black since onset of symptoms. Later in the evening, he started becoming lightheaded, feeling like he was going to pass out but denies full syncope. He denies pain. Last colonoscopy was 6 months ago and he states it was unremarkable. Last EGD also 6 months ago. He reports he is known to have varices but does not have a history of variceal bleed. He is followed by Dr. Levora Angel, GI.  The history is provided by the patient and the spouse. No language interpreter was used.       Past Medical History:  Diagnosis Date  . Cirrhosis of liver (HCC)   . Diabetes mellitus without complication (HCC)   . Vision abnormalities     Patient Active Problem List   Diagnosis Date Noted  . Myalgia 07/25/2018    Past Surgical History:  Procedure Laterality Date  . SHOULDER ARTHROSCOPY Left age 10       Family History  Problem Relation Age of Onset  . Diabetes type II Mother   . Heart disease Mother   . Diabetes type II Father   . Diabetes type II Sister     Social History   Tobacco Use  . Smoking status: Former Games developer  . Smokeless tobacco: Never Used  Substance Use Topics  . Alcohol use: Not Currently  . Drug use: Not Currently    Home Medications Prior to Admission medications   Medication Sig Start Date End Date Taking? Authorizing Provider  carbamazepine (TEGRETOL) 200 MG tablet 1/2 tablet twice a day for 2 weeks, then take 1 tablet twice a  day 03/30/18   York Spaniel, MD  DULoxetine (CYMBALTA) 30 MG capsule One tablet daily for 1 week, then take one twice daily 07/25/18   York Spaniel, MD  Efinaconazole 10 % SOLN Apply 1 drop topically daily. 11/26/17   York Spaniel, MD  gabapentin (NEURONTIN) 300 MG capsule 2 in the am 3 in the afternoon and 3 in the evening 11/19/17   [provider]  insulin regular (NOVOLIN R,HUMULIN R) 100 units/mL injection Inject into the skin 3 (three) times daily before meals.    [provider]  metFORMIN (GLUCOPHAGE) 1000 MG tablet Take 1,000 mg by mouth 2 (two) times daily. 10/06/17   [provider]  pantoprazole (PROTONIX) 40 MG tablet Take 40 mg by mouth every morning. 10/18/17   [provider]  traMADol (ULTRAM) 50 MG tablet Take by mouth every 6 (six) hours as needed.    [provider]  VICTOZA 18 MG/3ML SOPN INJECT 1.2MG  INTO THE SKIN ONCE A DAY 11/18/17   [provider]    Allergies    Patient has no known allergies.  Review of Systems   Review of Systems  Constitutional: Negative for chills and fever.  HENT: Negative.   Respiratory: Negative.   Cardiovascular: Negative.   Gastrointestinal: Positive for blood in stool (Melena), nausea and vomiting (hematemesis). Negative for  abdominal pain.  Musculoskeletal: Negative.   Skin: Negative.   Neurological: Positive for light-headedness. Negative for syncope (Near syncope).    Physical Exam Updated Vital Signs BP (!) 89/51   Pulse 93   Temp 97.9 F (36.6 C)   Resp 18   Ht 5\' 3"  (1.6 m)   Wt 83.9 kg   SpO2 95%   BMI 32.77 kg/m   Physical Exam Vitals and nursing note reviewed.  Constitutional:      Appearance: He is well-developed.  HENT:     Head: Normocephalic.  Eyes:     Comments: No conjunctival pallor  Cardiovascular:     Rate and Rhythm: Normal rate and regular rhythm.  Pulmonary:     Effort: Pulmonary effort is normal.     Breath sounds: Normal  breath sounds. No wheezing, rhonchi or rales.  Abdominal:     General: There is no distension.     Palpations: Abdomen is soft.     Tenderness: There is no abdominal tenderness. There is no guarding or rebound.  Genitourinary:    Rectum: Guaiac result positive.     Comments: Stool appears melanic Musculoskeletal:        General: Normal range of motion.     Cervical back: Normal range of motion and neck supple.  Skin:    General: Skin is warm and dry.     Findings: No rash.  Neurological:     Mental Status: He is alert and oriented to person, place, and time.     ED Results / Procedures / Treatments   Labs (all labs ordered are listed, but only abnormal results are displayed) Labs Reviewed  COMPREHENSIVE METABOLIC PANEL - Abnormal; Notable for the following components:      Result Value   Glucose, Bld 187 (*)    BUN 31 (*)    Calcium 8.8 (*)    AST 54 (*)    ALT 63 (*)    Total Bilirubin 1.3 (*)    All other components within normal limits  CBC - Abnormal; Notable for the following components:   WBC 11.2 (*)    RBC 4.03 (*)    Platelets 149 (*)    All other components within normal limits  PROTIME-INR - Abnormal; Notable for the following components:   Prothrombin Time 15.9 (*)    INR 1.3 (*)    All other components within normal limits  POC OCCULT BLOOD, ED - Abnormal; Notable for the following components:   Fecal Occult Bld POSITIVE (*)    All other components within normal limits  APTT  ETHANOL  TYPE AND SCREEN  ABO/RH    EKG None  Radiology No results found.  Procedures Procedures (including critical care time) CRITICAL CARE Performed by:   Total critical care time: 45 minutes  Critical care time was exclusive of separately billable procedures and treating other patients.  Critical care was necessary to treat or prevent imminent or life-threatening deterioration.  Critical care was time spent personally by me on the following  activities: development of treatment plan with patient and/or surrogate as well as nursing, discussions with consultants, evaluation of patient's response to treatment, examination of patient, obtaining history from patient or surrogate, ordering and performing treatments and interventions, ordering and review of laboratory studies, ordering and review of radiographic studies, pulse oximetry and re-evaluation of patient's condition.  Medications Ordered in ED Medications  pantoprazole (PROTONIX) 80 mg in sodium chloride 0.9 % 100 mL IVPB (80  mg Intravenous New Bag/Given 05/14/19 0225)  sodium chloride 0.9 % bolus 1,000 mL (has no administration in time range)  ondansetron (ZOFRAN) injection 4 mg (4 mg Intravenous Given 05/14/19 0224)    ED Course  I have reviewed the triage vital signs and the nursing notes.  Pertinent labs & imaging results that were available during my care of the patient were reviewed by me and considered in my medical decision making (see chart for details).    MDM Rules/Calculators/A&P                      Patient to ED with ss/sxs as detailed in the HPI.   On arrival, the patient had a near syncopal episode while in triage. No active vomiting here. He endorses some ongoing nausea. He is found to be hypotensive.   IV established, fluid bolus is running. IV Protonix, IV Zofran provided. Will monitor blood pressures. He is awake, oriented, in NAD. Guaiac positive stool, with hgb stable at 13.7.   Anticipate admission for acute GI bleeding. Labs are pending.   Blood pressure improved at 109/62. No further vomiting. Given active bleeding and hypotension, will plan for admission to medicine, GI consult in am as no emergent procedure indicated now.   Hospitalist paged.   Discussed with dr. Marlowe Sax, [facility], who advises to start Octreotide and she will see for admission. Octreotide dosing discussed and guided by pharmacy.   Final Clinical Impression(s) / ED Diagnoses Final  diagnoses:  None   1. Upper GI bleeding 2. hypotension  Rx / DC Orders ED Discharge Orders    None       Charlann Lange, PA-C 05/14/19 0431    Molpus, Jenny Reichmann, MD 05/14/19 [phone]

## 2019-05-14 NOTE — Anesthesia Postprocedure Evaluation (Signed)
Anesthesia Post Note  Patient: Hunter Gray  Procedure(s) Performed: ESOPHAGOGASTRODUODENOSCOPY (EGD) WITH PROPOFOL (N/A ) ESOPHAGEAL BANDING     Patient location during evaluation: PACU Anesthesia Type: MAC Level of consciousness: awake and alert Pain management: pain level controlled Vital Signs Assessment: post-procedure vital signs reviewed and stable Respiratory status: spontaneous breathing, nonlabored ventilation, respiratory function stable and patient connected to nasal cannula oxygen Cardiovascular status: stable and blood pressure returned to baseline Postop Assessment: no apparent nausea or vomiting Anesthetic complications: no    Last Vitals:  Vitals:   05/14/19 1800 05/14/19 2000  BP: (!) 106/46   Pulse: 74   Resp: 12   Temp:  37.3 C  SpO2: 95%     Last Pain:  Vitals:   05/14/19 2000  TempSrc: Oral  PainSc: 7                  Kiesha Ensey

## 2019-05-14 NOTE — Anesthesia Preprocedure Evaluation (Addendum)
Anesthesia Evaluation  Patient identified by MRN, date of birth, ID band Patient awake    Reviewed: Allergy & Precautions, H&P , NPO status , Patient's Chart, lab work & pertinent test results, reviewed documented beta blocker date and time   Airway Mallampati: III  TM Distance: >3 FB Neck ROM: full    Dental no notable dental hx. (+) Teeth Intact, Dental Advisory Given   Pulmonary neg pulmonary ROS, former smoker,    Pulmonary exam normal breath sounds clear to auscultation       Cardiovascular Exercise Tolerance: Good negative cardio ROS   Rhythm:regular Rate:Normal     Neuro/Psych negative neurological ROS  negative psych ROS   GI/Hepatic negative GI ROS, (+) Cirrhosis   Esophageal Varices    ,   Endo/Other  negative endocrine ROSdiabetes, Type 2  Renal/GU negative Renal ROS  negative genitourinary   Musculoskeletal negative musculoskeletal ROS (+)   Abdominal   Peds  Hematology  (+) Blood dyscrasia, anemia ,   Anesthesia Other Findings   Reproductive/Obstetrics negative OB ROS                           Anesthesia Physical Anesthesia Plan  ASA: III and emergent  Anesthesia Plan: MAC   Post-op Pain Management:    Induction:   PONV Risk Score and Plan: 1  Airway Management Planned: Nasal Cannula, Simple Face Mask and Mask  Additional Equipment:   Intra-op Plan:   Post-operative Plan:   Informed Consent: I have reviewed the patients History and Physical, chart, labs and discussed the procedure including the risks, benefits and alternatives for the proposed anesthesia with the patient or authorized representative who has indicated his/her understanding and acceptance.     Dental Advisory Given  Plan Discussed with: CRNA, Anesthesiologist and Surgeon  Anesthesia Plan Comments:         Anesthesia Quick Evaluation

## 2019-05-14 NOTE — Brief Op Note (Signed)
05/14/2019  10:20 AM  PATIENT:  Hunter Gray  49 y.o. male  PRE-OPERATIVE DIAGNOSIS:  hematemesis, melena, esophageal varices  POST-OPERATIVE DIAGNOSIS:  2 esophageal varcies with 4 bands, portal hypertension gastrophy   PROCEDURE:  Procedure(s): ESOPHAGOGASTRODUODENOSCOPY (EGD) WITH PROPOFOL (N/A) ESOPHAGEAL BANDING  SURGEON:  Surgeon(s) and Role:    Ronnette Juniper, MD - Primary  PHYSICIAN ASSISTANT:   ASSISTANTS:Zharia Burton,RN, Waynette Buttery  ANESTHESIA:   MAC  EBL:  None  BLOOD ADMINISTERED:none  DRAINS: none   LOCAL MEDICATIONS USED:  NONE  SPECIMEN:  No Specimen  DISPOSITION OF SPECIMEN:  N/A  COUNTS:  YES  TOURNIQUET:  * No tourniquets in log *  DICTATION: .Dragon Dictation  PLAN OF CARE: Admit to inpatient   PATIENT DISPOSITION:  PACU - hemodynamically stable.   Delay start of Pharmacological VTE agent (>24hrs) due to surgical blood loss or risk of bleeding: not applicable

## 2019-05-14 NOTE — Transfer of Care (Signed)
Immediate Anesthesia Transfer of Care Note  Patient: Hunter Gray  Procedure(s) Performed: Procedure(s): ESOPHAGOGASTRODUODENOSCOPY (EGD) WITH PROPOFOL (N/A) ESOPHAGEAL BANDING  Patient Location: PACU and Endoscopy Unit  Anesthesia Type:MAC  Level of Consciousness: awake, alert  and oriented  Airway & Oxygen Therapy: Patient Spontanous Breathing and Patient connected to nasal cannula oxygen  Post-op Assessment: Report given to RN and Post -op Vital signs reviewed and stable  Post vital signs: Reviewed and stable  Last Vitals:  Vitals:   05/14/19 0817 05/14/19 0954  BP: (!) 104/53 130/68  Pulse: 89 97  Resp: 16 18  Temp:  36.8 C  SpO2: 16% 10%    Complications: No apparent anesthesia complications

## 2019-05-14 NOTE — ED Triage Notes (Signed)
Patient states today around 1400 he began vomiting. Patient states that he believes there is blood in his vomit. Patient states he has melena and his last emesis was extremely dark. Patient has a hx of cirrhosis.

## 2019-05-14 NOTE — Interval H&P Note (Signed)
History and Physical Interval Note: 49/male with hematemesis and melena, esophageal varices and cirrhosis for an EGD with possible banding.  05/14/2019 9:51 AM  Hunter Gray  has presented today for EGD with possible banding, with the diagnosis of hematemesis, melena, esophageal varices.  The various methods of treatment have been discussed with the patient and family. After consideration of risks, benefits and other options for treatment, the patient has consented to  Procedure(s): ESOPHAGOGASTRODUODENOSCOPY (EGD) WITH PROPOFOL (N/A) as a surgical intervention.  The patient's history has been reviewed, patient examined, no change in status, stable for surgery.  I have reviewed the patient's chart and labs.  Questions were answered to the patient's satisfaction.     Kerin Salen

## 2019-05-14 NOTE — H&P (Signed)
History and Physical    ALRICK CUBBAGE WFU:[id number] DOB: 08-25-1970 DOA: 05/14/2019  PCP: Farris Has, MD Patient coming from: Home  Chief Complaint: Hematemesis, melena  HPI: Hunter Gray is a 49 y.o. male with medical history significant of liver cirrhosis, insulin-dependent type 2 diabetes presenting with complaints of hematemesis and melena.  Patient states yesterday afternoon he started vomiting dark blood.  He has had about 5-6 episodes of vomiting since then.  His stool is black in color.  He has not been feeling well and stayed in bed all day.  Upon arrival to the ED he almost passed out.  Reports history of liver cirrhosis and his gastroenterologist is Dr. Doretha Imus.  States he had an endoscopy done 6 months ago which showed varices for which his gastroenterologist had recommended a procedure.  He is not aware of any history of gastric ulcers.  He had 2 Advils yesterday for a headache but does not use NSAIDs on a regular basis otherwise.  Denies abdominal pain.  Denies prior history of GI bleed.  States he used to drink alcohol socially in the past but stopped drinking completely 3 years ago.  Patient has no other complaints.  Denies fevers, cough, shortness of breath, or chest pain.  ED Course: Patient had a near syncopal episode at triage.  No active vomiting here.  Found to be hypotensive.  Hemoglobin stable at 13.4.  FOBT positive.  Transaminases mildly elevated and no significant change compared to prior labs.  T bili 1.3, was 0.4 on prior labs.  SARS-CoV-2 PCR test pending.  Patient is given IV Protonix 80 mg, Zofran, and 1 L normal saline bolus.  Review of Systems:  All systems reviewed and apart from history of presenting illness, are negative.  Past Medical History:  Diagnosis Date  . Cirrhosis of liver (HCC)   . Diabetes mellitus without complication (HCC)   . Vision abnormalities     Past Surgical History:  Procedure Laterality Date  . SHOULDER ARTHROSCOPY  Left age 71     reports that he has quit smoking. He has never used smokeless tobacco. He reports previous alcohol use. He reports previous drug use.  No Known Allergies  Family History  Problem Relation Age of Onset  . Diabetes type II Mother   . Heart disease Mother   . Diabetes type II Father   . Diabetes type II Sister     Prior to Admission medications   Medication Sig Start Date End Date Taking? Authorizing Provider  carbamazepine (TEGRETOL) 200 MG tablet 1/2 tablet twice a day for 2 weeks, then take 1 tablet twice a day 03/30/18   York Spaniel, MD  DULoxetine (CYMBALTA) 30 MG capsule One tablet daily for 1 week, then take one twice daily 07/25/18   York Spaniel, MD  Efinaconazole 10 % SOLN Apply 1 drop topically daily. 11/26/17   York Spaniel, MD  gabapentin (NEURONTIN) 300 MG capsule 2 in the am 3 in the afternoon and 3 in the evening 11/19/17   [provider]  insulin regular (NOVOLIN R,HUMULIN R) 100 units/mL injection Inject into the skin 3 (three) times daily before meals.    [provider]  metFORMIN (GLUCOPHAGE) 1000 MG tablet Take 1,000 mg by mouth 2 (two) times daily. 10/06/17   [provider]  pantoprazole (PROTONIX) 40 MG tablet Take 40 mg by mouth every morning. 10/18/17   [provider]  traMADol (ULTRAM) 50 MG tablet Take by mouth every  6 (six) hours as needed.    [provider]  VICTOZA 18 MG/3ML SOPN INJECT 1.2MG  INTO THE SKIN ONCE A DAY 11/18/17   [provider]    Physical Exam: Vitals:   05/14/19 0315 05/14/19 0324 05/14/19 0325 05/14/19 0415  BP: 109/62   110/61  Pulse: 85   91  Resp: 15   (!) 24  Temp:      TempSrc:      SpO2: 90% 95% 93% 95%  Weight:      Height:        Physical Exam  Constitutional: He is oriented to person, place, and time. He appears well-developed and well-nourished. No distress.  HENT:  Head: Normocephalic.  Eyes: Right eye exhibits no discharge. Left  eye exhibits no discharge.  Cardiovascular: Normal rate, regular rhythm and intact distal pulses.  Pulmonary/Chest: Effort normal and breath sounds normal. No respiratory distress. He has no wheezes. He has no rales.  Abdominal: Soft. Bowel sounds are normal. He exhibits no distension. There is no abdominal tenderness. There is no guarding.  Musculoskeletal:        General: No edema.     Cervical back: Neck supple.  Neurological: He is alert and oriented to person, place, and time.  Skin: Skin is warm and dry. He is not diaphoretic.    Labs on Admission: I have personally reviewed following labs and imaging studies  CBC: Recent Labs  Lab 05/14/19 0145  WBC 11.2*  HGB 13.4  HCT 39.6  MCV 98.3  PLT 149*   Basic Metabolic Panel: Recent Labs  Lab 05/14/19 0145  NA 140  K 3.9  CL 105  CO2 24  GLUCOSE 187*  BUN 31*  CREATININE 0.83  CALCIUM 8.8*   GFR: Estimated Creatinine Clearance: 103.1 mL/min (by C-G formula based on SCr of 0.83 mg/dL). Liver Function Tests: Recent Labs  Lab 05/14/19 0145  AST 54*  ALT 63*  ALKPHOS 71  BILITOT 1.3*  PROT 6.7  ALBUMIN 3.9   No results for input(s): LIPASE, AMYLASE in the last 168 hours. No results for input(s): AMMONIA in the last 168 hours. Coagulation Profile: Recent Labs  Lab 05/14/19 0213  INR 1.3*   Cardiac Enzymes: No results for input(s): CKTOTAL, CKMB, CKMBINDEX, TROPONINI in the last 168 hours. BNP (last 3 results) No results for input(s): PROBNP in the last 8760 hours. HbA1C: No results for input(s): HGBA1C in the last 72 hours. CBG: No results for input(s): GLUCAP in the last 168 hours. Lipid Profile: No results for input(s): CHOL, HDL, LDLCALC, TRIG, CHOLHDL, LDLDIRECT in the last 72 hours. Thyroid Function Tests: No results for input(s): TSH, T4TOTAL, FREET4, T3FREE, THYROIDAB in the last 72 hours. Anemia Panel: No results for input(s): VITAMINB12, FOLATE, FERRITIN, TIBC, IRON, RETICCTPCT in the last 72  hours. Urine analysis: No results found for: COLORURINE, APPEARANCEUR, LABSPEC, PHURINE, GLUCOSEU, HGBUR, BILIRUBINUR, KETONESUR, PROTEINUR, UROBILINOGEN, NITRITE, LEUKOCYTESUR  Radiological Exams on Admission: No results found.  Assessment/Plan Principal Problem:   Upper GI bleed Active Problems:   Liver cirrhosis (HCC)   Insulin dependent type 2 diabetes mellitus (HCC)   Acute upper GI bleed: Patient has a history of liver cirrhosis and is followed by Dr. Levora Angel from GI.  Presenting with complaints of hematemesis and melena.  Not able to see any prior EGD results in the chart.  Patient reports history of varices.  He was hypotensive on arrival and had a presyncopal episode at triage.  Blood pressure has now improved  after 1 L fluid bolus.  Currently hemodynamically stable.  No hematemesis since he has been in the ED.  Hemoglobin stable at 13.4.  FOBT positive.  Abdominal exam benign. -Admit to stepdown.  He has received PPI bolus, continue infusion.  Start octreotide.  Start ceftriaxone.  Antiemetic as needed.  Type and screen.  Monitor H&H every 4 hours.  Transfuse PRBCs if hemoglobin less than 8.  Keep n.p.o.  Consult GI in a.m. if there is recurrence of hematemesis or signs of hemodynamic instability, GI will be urgently consulted tonight.  Liver cirrhosis: No signs of hepatic encephalopathy.  Transaminases mildly elevated and no significant change compared to prior labs.  T bili 1.3, was 0.4 on prior labs. Right upper quadrant ultrasound done 04/12/2019 showing cirrhosis of the liver and no other focal abnormality.  Insulin-dependent type 2 diabetes: Check A1c.  Sliding scale insulin sensitive every 4 hours as patient is currently n.p.o.  DVT prophylaxis: SCDs Code Status: Full code Family Communication: No family available at this time. Disposition Plan: Status is: Inpatient  Remains inpatient appropriate because:Inpatient level of care appropriate due to severity of  illness   Dispo: The patient is from: Home              Anticipated d/c is to: Home              Anticipated d/c date is: 2 days              Patient currently is not medically stable to d/c.  The medical decision making on this patient was of high complexity and the patient is at high risk for clinical deterioration, therefore this is a level 3 visit.  Shela Leff MD [facility]  If 7PM-7AM, please contact night-coverage [email or web address]  05/14/2019, 4:42 AM

## 2019-05-14 NOTE — Progress Notes (Signed)
Same day note  Patient seen and examined at bedside.  Patient was admitted to the hospital for hematemesis, melena  At the time of my evaluation, patient complains of no further hematemesis or melena.  Last bowel movement around midnight.  Physical examination reveals- patient hemodynamically stable.  Mild pallor.  No abdominal distention or tenderness.  No ascites.  No peripheral edema.  Laboratory data and imaging was reviewed  Assessment and Plan.  Upper GI bleed with hematemesis melena.  Near syncopal episode with mild hypotension on presentation.  Likely secondary to esophageal varices bleeding.  Cannot rule out peptic ulceration since patient recently had NSAIDS.  Last alcohol consumption 2 and half years back.  History of cirrhosis of liver.  Seen by Dr. Lonna Cobb GI almost 6 months back.  Had endoscopy done at that time but not available in our computer system.  As per the patient, he does have varices.  At this time, patient is hemodynamically stable.  Spoke with GI Dr. Marca Ancona.  Plan would be to keep the patient n.p.o. and possible endoscopic evaluation today.  Continue octreotide and Protonix for now.  Continue IV fluid hydration.  Continue Rocephin IV for prophylaxis.  Hemoglobin every 6 hours.  Transfuse for ongoing bleeding or hemoglobin less than 7.  Type and screen.  1.3.  Diabetes mellitus type 2.  Continue NPO.  on sliding scale insulin every 4 hourly for now.  Hemoglobin A1c of 6.9.  Spoke with the  patient's spouse at bedside.  No Charge  Signed,  Tenny Craw, MD [facility]

## 2019-05-14 NOTE — Op Note (Signed)
[facility] Patient Name: Hunter Gray Procedure Date: 05/14/2019 MRN: [id number] Attending MD: Ronnette Juniper , MD Date of Birth: 25-Sep-1970 CSN: [id number] Age: 49 Admit Type: Inpatient Procedure:                Upper GI endoscopy Indications:              Hematemesis, Melena, For therapy of esophageal                            varices Providers:                Ronnette Juniper, MD, Benay Pillow, RN, Elspeth Cho                            Tech., Technician Referring MD:             [facility] Medicines:                Monitored Anesthesia Care Complications:            No immediate complications. Estimated blood loss:                            None. Estimated Blood Loss:     Estimated blood loss: none. Procedure:                Pre-Anesthesia Assessment:                           - Prior to the procedure, a History and Physical                            was performed, and patient medications and                            allergies were reviewed. The patient's tolerance of                            previous anesthesia was also reviewed. The risks                            and benefits of the procedure and the sedation                            options and risks were discussed with the patient.                            All questions were answered, and informed consent                            was obtained. Prior Anticoagulants: The patient has                            taken no previous anticoagulant or antiplatelet                            agents. ASA Grade Assessment: III -  A patient with                            severe systemic disease. After reviewing the risks                            and benefits, the patient was deemed in                            satisfactory condition to undergo the procedure.                           After obtaining informed consent, the endoscope was                            passed under direct vision. Throughout  the                            procedure, the patient's blood pressure, pulse, and                            oxygen saturations were monitored continuously. The                            [id number]-[id number] ([id number]) was introduced through the                            mouth, and advanced to the second part of duodenum.                            The upper GI endoscopy was accomplished without                            difficulty. The patient tolerated the procedure                            well. Scope In: Scope Out: Findings:      Grade II varices were found in the middle third of the esophagus and in       the lower third of the esophagus. They were medium in size. Four bands       were successfully placed with complete eradication, resulting in       deflation of varices. There was no bleeding during and at the end of the       procedure.      The Z-line was regular and was found 35 cm from the incisors.      Few non-bleeding superficial gastric ulcers with a clean ulcer base       (Forrest Class III) were found in the gastric fundus and in the gastric       body. The largest lesion was 3 mm in largest dimension.      Moderate portal hypertensive gastropathy was found in the gastric body.      The cardia and gastric fundus were otherwise normal on retroflexion.      The examined duodenum was normal.      Localized mildly  erythematous mucosa without bleeding was found in the       gastric antrum. Impression:               - Grade II esophageal varices. Completely                            eradicated. Banded.                           - Z-line regular, 35 cm from the incisors.                           - Non-bleeding gastric ulcers with a clean ulcer                            base (Forrest Class III).                           - Portal hypertensive gastropathy.                           - Normal examined duodenum.                           - Erythematous mucosa in the antrum.                            - No specimens collected. Moderate Sedation:      Patient did not receive moderate sedation for this procedure, but       instead received monitored anesthesia care. Recommendation:           - Clear liquid diet after 4 hours.                           - Continue present medications- IV Octreotide drip                            for another 24 hours, D/C IV protonix drip and                            start protonix 40 mg IV BID.Marland Kitchen Procedure Code(s):        --- Professional ---                           [phone], Esophagogastroduodenoscopy, flexible,                            transoral; with band ligation of esophageal/gastric                            varices Diagnosis Code(s):        --- Professional ---                           I85.00, Esophageal varices without bleeding  K25.9, Gastric ulcer, unspecified as acute or                            chronic, without hemorrhage or perforation                           K76.6, Portal hypertension                           K31.89, Other diseases of stomach and duodenum                           K92.0, Hematemesis                           K92.1, Melena (includes Hematochezia) CPT copyright 2019 [employer]. All rights reserved. The codes documented in this report are preliminary and upon coder review may  be revised to meet current compliance requirements. Kerin Salen, MD 05/14/2019 10:20:00 AM This report has been signed electronically. Number of Addenda: 0

## 2019-05-14 NOTE — Consult Note (Signed)
[facility] Consult  Referring Provider: Dr.Pokharel/[facility] Primary Care Physician:  Morrow, Aaron, MD Primary Gastroenterologist: Dr.Brahmbhatt/[facility]  Reason for Consultation: Vomiting blood and black stools  HPI: Hunter Gray is a 49 y.o. male with history of alcohol related cirrhosis, diagnosed in 2019, presented to the ED with complaints of multiple episodes of hematemesis and two episodes of melena. Patient states he was in his usual state of health until yesterday afternoon, when he started vomiting, maroon and dark brown-colored fluid.  He describes at least 5-6 of those episodes followed by two episodes of liquid black stools.  This was not associated with shortness of breath, chest pain, dizziness or loss of consciousness. Patient denies abdominal pain. No prior history of vomiting blood or black stools.  Last episode of vomiting blood was at 2 AM after admission. He has not required any blood transfusion and has been started on IV octreotide and IV Protonix drip.  Patient had an EGD in 12/19 which showed grade 2 esophageal varices and has been on nadolol 20 mg daily. Prior to that EGD from 10/19 showed grade 1 esophageal varices.  Last colonoscopy was performed in 12/2017, four tubular adenomas were removed, biopsies were negative for microscopic colitis.  He was also noted to have internal hemorrhoids.  Patient states his last alcohol use was in July 2019.  Patient took 2 pills of Aleve on Saturday, but denies regular use of NSAIDs aspirin.  Patient denies swelling of abdomen, swelling of extremities, confusion or prior history of encephalopathy.  Past Medical History:  Diagnosis Date  . Cirrhosis of liver (HCC)   . Diabetes mellitus without complication (HCC)   . Vision abnormalities     Past Surgical History:  Procedure Laterality Date  . SHOULDER ARTHROSCOPY Left age 16    Prior to Admission medications   Medication Sig Start  Date End Date Taking? Authorizing Provider  fluticasone (FLONASE) 50 MCG/ACT nasal spray Place 1 spray into both nostrils daily as needed for allergies or rhinitis.   Yes [provider]  gabapentin (NEURONTIN) 600 MG tablet Take 600-1,200 mg by mouth See admin instructions. 600mg in am, lunch and 1200mg at hs 03/30/19  Yes [provider]  nadolol (CORGARD) 20 MG tablet Take 20 mg by mouth daily. 04/05/19  Yes [provider]  SYNJARDY XR 05-998 MG TB24 Take 2 tablets by mouth every morning. 04/28/19  Yes [provider]  traMADol (ULTRAM) 50 MG tablet Take 50 mg by mouth every 6 (six) hours as needed for moderate pain.    Yes [provider]  VICTOZA 18 MG/3ML SOPN Inject 1.8 mg into the skin daily.  11/18/17  Yes [provider]  carbamazepine (TEGRETOL) 200 MG tablet 1/2 tablet twice a day for 2 weeks, then take 1 tablet twice a day Patient not taking: Reported on 05/14/2019 03/30/18   Willis, Charles K, MD  DULoxetine (CYMBALTA) 30 MG capsule One tablet daily for 1 week, then take one twice daily Patient not taking: Reported on 05/14/2019 07/25/18   Willis, Charles K, MD  Efinaconazole 10 % SOLN Apply 1 drop topically daily. Patient not taking: Reported on 05/14/2019 11/26/17   Willis, Charles K, MD    Current Facility-Administered Medications  Medication Dose Route Frequency Provider Last Rate Last Admin  . 0.9 %  sodium chloride infusion   Intravenous Continuous Rathore, Vasundhra, MD 150 mL/hr at 05/14/19 0900 Rate Verify at 05/14/19 0900  . cefTRIAXone (ROCEPHIN) 2 g in sodium chloride 0.9 %   100 mL IVPB  2 g Intravenous Q0600 John Giovanni, MD   Stopped at 05/14/19 [phone]  . Chlorhexidine Gluconate Cloth 2 % PADS 6 each  6 each Topical Daily Pokhrel, Laxman, MD   6 each at 05/14/19 0818  . insulin aspart (novoLOG) injection 0-9 Units  0-9 Units Subcutaneous Q4H John Giovanni, MD   1 Units at 05/14/19 0818  . metoCLOPramide (REGLAN)  injection 10 mg  10 mg Intravenous Once Kerin Salen, MD      . octreotide (SANDOSTATIN) 2 mcg/mL load via infusion 50 mcg  50 mcg Intravenous Once John Giovanni, MD       And  . octreotide (SANDOSTATIN) 500 mcg in sodium chloride 0.9 % 250 mL (2 mcg/mL) infusion  50 mcg/hr Intravenous Continuous John Giovanni, MD 25 mL/hr at 05/14/19 0555 50 mcg/hr at 05/14/19 0555  . ondansetron (ZOFRAN) injection 4 mg  4 mg Intravenous Q6H PRN John Giovanni, MD   4 mg at 05/14/19 4081  . pantoprazole (PROTONIX) 80 mg in sodium chloride 0.9 % 100 mL (0.8 mg/mL) infusion  8 mg/hr Intravenous Continuous John Giovanni, MD 10 mL/hr at 05/14/19 0900 8 mg/hr at 05/14/19 0900    Allergies as of 05/14/2019  . (No Known Allergies)    Family History  Problem Relation Age of Onset  . Diabetes type II Mother   . Heart disease Mother   . Diabetes type II Father   . Diabetes type II Sister     Social History   Socioeconomic History  . Marital status: Married    Spouse name: Not on file  . Number of children: Not on file  . Years of education: Not on file  . Highest education level: Not on file  Occupational History  . Not on file  Tobacco Use  . Smoking status: Former Games developer  . Smokeless tobacco: Never Used  Substance and Sexual Activity  . Alcohol use: Not Currently  . Drug use: Not Currently  . Sexual activity: Not on file  Other Topics Concern  . Not on file  Social History Narrative  . Not on file   Social Determinants of Health   Financial Resource Strain:   . Difficulty of Paying Living Expenses:   Food Insecurity:   . Worried About Programme researcher, broadcasting/film/video in the Last Year:   . Barista in the Last Year:   Transportation Needs:   . Freight forwarder (Medical):   Marland Kitchen Lack of Transportation (Non-Medical):   Physical Activity:   . Days of Exercise per Week:   . Minutes of Exercise per Session:   Stress:   . Feeling of Stress :   Social Connections:   .  Frequency of Communication with Friends and Family:   . Frequency of Social Gatherings with Friends and Family:   . Attends Religious Services:   . Active Member of Clubs or Organizations:   . Attends Banker Meetings:   Marland Kitchen Marital Status:   Intimate Partner Violence:   . Fear of Current or Ex-Partner:   . Emotionally Abused:   Marland Kitchen Physically Abused:   . Sexually Abused:     Review of Systems:  GI: Described in detail in HPI.    Gen: Denies any fever, chills, rigors, night sweats, anorexia, fatigue, weakness, malaise, involuntary weight loss, and sleep disorder CV: Denies chest pain, angina, palpitations, syncope, orthopnea, PND, peripheral edema, and claudication. Resp: Denies dyspnea, cough, sputum, wheezing, coughing up blood. GU : Denies  urinary burning, blood in urine, urinary frequency, urinary hesitancy, nocturnal urination, and urinary incontinence. MS: Denies joint pain or swelling.  Denies muscle weakness, cramps, atrophy.  Derm: Denies rash, itching, oral ulcerations, hives, unhealing ulcers.  Psych: Denies depression, anxiety, memory loss, suicidal ideation, hallucinations,  and confusion. Heme: Denies bruising and enlarged lymph nodes. Neuro:  Denies any headaches, dizziness, paresthesias. Endo:   DM,Denies any problems with thyroid, adrenal function.  Physical Exam: Vital signs in last 24 hours: Temp:  [97.9 F (36.6 C)-98.1 F (36.7 C)] 98.1 F (36.7 C) (05/09 0808) Pulse Rate:  [85-104] 89 (05/09 0817) Resp:  [13-27] 16 (05/09 0817) BP: (74-116)/(43-80) 104/53 (05/09 0817) SpO2:  [90 %-98 %] 98 % (05/09 0817) Weight:  [83.9 kg] 83.9 kg (05/09 0139) Last BM Date: 05/14/19  General:   Alert,  Well-developed, well-nourished, pleasant and cooperative in NAD Head:  Normocephalic and atraumatic. Eyes:  Sclera clear, no icterus.   Mild pallor Ears:  Normal auditory acuity. Nose:  No deformity, discharge,  or lesions. Mouth:  No deformity or lesions.   Oropharynx pink & moist. Neck:  Supple; no masses or thyromegaly. Lungs:  Clear throughout to auscultation.   No wheezes, crackles, or rhonchi. No acute distress. Heart:  Regular rate and rhythm; no murmurs, clicks, rubs,  or gallops. Extremities:  Without clubbing or edema. Neurologic:  Alert and  oriented x4;  grossly normal neurologically.  No asterixis. Skin:  Intact without significant lesions or rashes. Psych:  Alert and cooperative. Normal mood and affect. Abdomen:  Soft, nontender and nondistended. No masses, hepatosplenomegaly or hernias noted. Normal bowel sounds, without guarding, and without rebound.         Lab Results: Recent Labs    05/14/19 0145 05/14/19 0438 05/14/19 0832  WBC 11.2*  --   --   HGB 13.4 10.9* 10.3*  HCT 39.6 32.1* 30.4*  PLT 149*  --   --    BMET Recent Labs    05/14/19 0145  NA 140  K 3.9  CL 105  CO2 24  GLUCOSE 187*  BUN 31*  CREATININE 0.83  CALCIUM 8.8*   LFT Recent Labs    05/14/19 0145  PROT 6.7  ALBUMIN 3.9  AST 54*  ALT 63*  ALKPHOS 71  BILITOT 1.3*   PT/INR Recent Labs    05/14/19 0213  LABPROT 15.9*  INR 1.3*    Studies/Results: No results found.  Impression: Hematemesis and melena in a patient with history of alcohol-related cirrhosis and esophageal varices Bleeding most likely related to esophageal varices. Remains hemodynamically stable, hemoglobin is stable at 10.9/10.3, although on presentation hemoglobin was 13.4 Normal platelets 149 Elevated BUN/creatinine ratio of 31/0.83 compatible with an upper GI bleed T bili/AST/ALT/ALP of one-point 3/54/63/71 PT/INR 15.9/1.3 Meld sodium score is 10  No evidence of encephalopathy, ascites or pedal edema   Plan: Patient remains n.p.o., last meal was yesterday afternoon EGD with possible banding of esophageal varices planned now The risks and the benefits of the procedure were discussed with the patient in details. I have discussed with the patient and  his wife at bedside that if banding fails to control variceal bleeding, he may need TIPS for continued bleeding. Continue IV ceftriaxone, IV octreotide and IV Protonix Will give patient Reglan 10 mg IV x1 dose    LOS: 0 days   Ronnette Juniper, MD  05/14/2019, 9:25 AM

## 2019-05-15 ENCOUNTER — Encounter: Payer: Self-pay | Admitting: *Deleted

## 2019-05-15 LAB — COMPREHENSIVE METABOLIC PANEL
ALT: 42 U/L (ref 0–44)
AST: 34 U/L (ref 15–41)
Albumin: 3.1 g/dL — ABNORMAL LOW (ref 3.5–5.0)
Alkaline Phosphatase: 41 U/L (ref 38–126)
Anion gap: 6 (ref 5–15)
BUN: 14 mg/dL (ref 6–20)
CO2: 26 mmol/L (ref 22–32)
Calcium: 8 mg/dL — ABNORMAL LOW (ref 8.9–10.3)
Chloride: 110 mmol/L (ref 98–111)
Creatinine, Ser: 0.9 mg/dL (ref 0.61–1.24)
GFR calc Af Amer: >60 mL/min (ref >60)
GFR calc non Af Amer: >60 mL/min (ref >60)
Glucose, Bld: 154 mg/dL — ABNORMAL HIGH (ref 70–99)
Potassium: 3.9 mmol/L (ref 3.5–5.1)
Sodium: 142 mmol/L (ref 135–145)
Total Bilirubin: 0.7 mg/dL (ref 0.3–1.2)
Total Protein: 5.3 g/dL — ABNORMAL LOW (ref 6.5–8.1)

## 2019-05-15 LAB — CBC
HCT: 28.6 % — ABNORMAL LOW (ref 39.0–52.0)
Hemoglobin: 9.4 g/dL — ABNORMAL LOW (ref 13.0–17.0)
MCH: 32.8 pg (ref 26.0–34.0)
MCHC: 32.9 g/dL (ref 30.0–36.0)
MCV: 99.7 fL (ref 80.0–100.0)
Platelets: 109 10*3/uL — ABNORMAL LOW (ref 150–400)
RBC: 2.87 MIL/uL — ABNORMAL LOW (ref 4.22–5.81)
RDW: 13.8 % (ref 11.5–15.5)
WBC: 7.4 10*3/uL (ref 4.0–10.5)
nRBC: 0 % (ref 0.0–0.2)

## 2019-05-15 LAB — GLUCOSE, CAPILLARY
Glucose-Capillary: 142 mg/dL — ABNORMAL HIGH (ref 70–99)
Glucose-Capillary: 143 mg/dL — ABNORMAL HIGH (ref 70–99)
Glucose-Capillary: 155 mg/dL — ABNORMAL HIGH (ref 70–99)
Glucose-Capillary: 155 mg/dL — ABNORMAL HIGH (ref 70–99)
Glucose-Capillary: 161 mg/dL — ABNORMAL HIGH (ref 70–99)

## 2019-05-15 LAB — PROTIME-INR
INR: 1.3 — ABNORMAL HIGH (ref 0.8–1.2)
Prothrombin Time: 15.6 seconds — ABNORMAL HIGH (ref 11.4–15.2)

## 2019-05-15 LAB — MAGNESIUM: Magnesium: 1.8 mg/dL (ref 1.7–2.4)

## 2019-05-15 LAB — PHOSPHORUS: Phosphorus: 2.9 mg/dL (ref 2.5–4.6)

## 2019-05-15 MED ORDER — FLUTICASONE PROPIONATE 50 MCG/ACT NA SUSP
1.0000 | Freq: Every day | NASAL | Status: DC | PRN
  Filled 2019-05-15: qty 16

## 2019-05-15 MED ORDER — INSULIN ASPART 100 UNIT/ML ~~LOC~~ SOLN
0.0000 [IU] | Freq: Three times a day (TID) | SUBCUTANEOUS | Status: DC
  Administered 2019-05-16: 2 [IU] via SUBCUTANEOUS
  Administered 2019-05-16: 1 [IU] via SUBCUTANEOUS
  Administered 2019-05-17 (×2): 2 [IU] via SUBCUTANEOUS
  Administered 2019-05-17: 1 [IU] via SUBCUTANEOUS
  Administered 2019-05-18: 3 [IU] via SUBCUTANEOUS
  Administered 2019-05-18: 1 [IU] via SUBCUTANEOUS
  Administered 2019-05-19 (×2): 2 [IU] via SUBCUTANEOUS

## 2019-05-15 MED ORDER — CANAGLIFLOZIN 100 MG PO TABS
100.0000 mg | ORAL_TABLET | Freq: Every day | ORAL | Status: DC
  Administered 2019-05-15: 100 mg via ORAL
  Filled 2019-05-15: qty 1

## 2019-05-15 MED ORDER — PANTOPRAZOLE SODIUM 40 MG IV SOLR: 40.0000 mg | Freq: Two times a day (BID) | INTRAVENOUS | Status: DC

## 2019-05-15 MED ORDER — GABAPENTIN 400 MG PO CAPS
1200.0000 mg | ORAL_CAPSULE | Freq: Every day | ORAL | Status: DC
  Administered 2019-05-15 – 2019-05-18 (×4): 1200 mg via ORAL
  Filled 2019-05-15 (×4): qty 3

## 2019-05-15 MED ORDER — PANTOPRAZOLE SODIUM 40 MG PO TBEC
40.0000 mg | DELAYED_RELEASE_TABLET | Freq: Two times a day (BID) | ORAL | Status: DC
  Administered 2019-05-15 – 2019-05-19 (×9): 40 mg via ORAL
  Filled 2019-05-15 (×9): qty 1

## 2019-05-15 MED ORDER — NADOLOL 20 MG PO TABS
20.0000 mg | ORAL_TABLET | Freq: Every day | ORAL | Status: DC
  Administered 2019-05-16 – 2019-05-18 (×3): 20 mg via ORAL
  Filled 2019-05-15 (×4): qty 1

## 2019-05-15 MED ORDER — CANAGLIFLOZIN 100 MG PO TABS
100.0000 mg | ORAL_TABLET | Freq: Every day | ORAL | Status: DC
  Administered 2019-05-16: 100 mg via ORAL
  Filled 2019-05-15 (×3): qty 1

## 2019-05-15 MED ORDER — PANTOPRAZOLE SODIUM 40 MG PO TBEC: 40.0000 mg | DELAYED_RELEASE_TABLET | Freq: Two times a day (BID) | ORAL | Status: DC

## 2019-05-15 MED ORDER — PHENOL 1.4 % MT LIQD
1.0000 | OROMUCOSAL | Status: DC | PRN
  Filled 2019-05-15: qty 177

## 2019-05-15 MED ORDER — METFORMIN HCL ER 750 MG PO TB24
2000.0000 mg | ORAL_TABLET | Freq: Every day | ORAL | Status: DC
  Administered 2019-05-15 – 2019-05-19 (×5): 2000 mg via ORAL
  Filled 2019-05-15 (×5): qty 1

## 2019-05-15 MED ORDER — METFORMIN HCL ER 750 MG PO TB24
2000.0000 mg | ORAL_TABLET | Freq: Every day | ORAL | Status: DC
  Filled 2019-05-15: qty 1

## 2019-05-15 MED ORDER — MORPHINE SULFATE (PF) 2 MG/ML IV SOLN
2.0000 mg | INTRAVENOUS | Status: AC | PRN
  Administered 2019-05-15 (×2): 2 mg via INTRAVENOUS
  Filled 2019-05-15 (×2): qty 1

## 2019-05-15 MED ORDER — LIRAGLUTIDE 18 MG/3ML ~~LOC~~ SOPN
1.8000 mg | PEN_INJECTOR | Freq: Every day | SUBCUTANEOUS | Status: DC
  Filled 2019-05-15: qty 3

## 2019-05-15 MED ORDER — EMPAGLIFLOZIN-METFORMIN HCL ER 5-1000 MG PO TB24: 2.0000 | ORAL_TABLET | Freq: Every morning | ORAL | Status: DC

## 2019-05-15 MED ORDER — TRAMADOL HCL 50 MG PO TABS
50.0000 mg | ORAL_TABLET | Freq: Four times a day (QID) | ORAL | Status: DC | PRN
  Administered 2019-05-15 – 2019-05-17 (×5): 50 mg via ORAL
  Filled 2019-05-15 (×5): qty 1

## 2019-05-15 MED ORDER — GABAPENTIN 300 MG PO CAPS
600.0000 mg | ORAL_CAPSULE | ORAL | Status: DC
  Administered 2019-05-15 – 2019-05-19 (×9): 600 mg via ORAL
  Filled 2019-05-15 (×8): qty 2

## 2019-05-15 NOTE — Progress Notes (Addendum)
PROGRESS NOTE  Hunter Gray FYB:[id number] DOB: 28-Oct-1970 DOA: 05/14/2019 PCP: Hunter Pepper, MD   LOS: 1 day   Brief narrative: As per HPI,  Hunter Gray is a 49 y.o. male with medical history significant of liver cirrhosis, type 2 diabetes presented to the hospital with hematemesis and melena.  Patient does have history of liver cirrhosis and follows up with Dr. Luan Gray.  States he had an endoscopy done 6 months ago which showed varices for which his gastroenterologist had recommended a procedure.  He is not aware of any history of gastric ulcers.  He had 2 Advils yesterday for a headache but does not use NSAIDs on a regular basis otherwise.  ED Course: Patient had a near syncopal episode at triage.    He was mildly hypotensive.  Hemoglobin at 13.4.  FOBT positive.  Transaminases mildly elevated and no significant change compared to prior labs.  T bili 1.3, was 0.4 on prior labs.  SARS-CoV-2 PCR was negative.  Patient was then admitted to hospital for upper GI bleed.  Assessment/Plan:  Principal Problem:   Upper GI bleed Active Problems:   Liver cirrhosis (HCC)   Insulin dependent type 2 diabetes mellitus (HCC)  Upper GI bleed with hematemesis melena.  Near syncopal episode with mild hypotension on presentation.  Status post endoscopy on 05/14/2019 with findings of esophageal varices and 4 bands were placed in.  Had nonbleeding gastric ulcer with clean base and portal hypertensive gastropathy as well.  Hemoglobin of 9.4 today from 10.6 yesterday.  Still on octreotide drip.  Will discontinue at 12 noon.  Continue IV Protonix twice a day.  Patient was counseled against NSAIDs.  Follows up with Dr. Bill Gray GI as outpatient.  Continue Rocephin IV for prophylaxis.  Transfuse for ongoing bleeding or hemoglobin less than 7.   Currently on clear liquids.   Could advance to full liquids today.  Spoke with GI at bedside.  Cirrhosis of liver with portal hypertension.  Will need follow-up  with GI as outpatient.  Diabetes mellitus type 2.    Continue sliding scale insulin  Hemoglobin A1c of 6.9.  Closely monitor.  Change the sliding scale insulin to before meals and at bedtime.  VTE Prophylaxis: Sequential compression device  Code Status: Full code  Family Communication: Spoke with the patient's spouse at bedside.  Status is: Inpatient  Remains inpatient appropriate because:GI bleed on octreotide drip   Dispo: The patient is from: Home              Anticipated d/c is to: Home              Anticipated d/c date is: 1 day              Patient currently is not medically stable to d/c.    Consultants:  Gastroenterology  Procedures:  Esophagogastroduodenoscopy with variceal banding on 05/14/2019.  Antibiotics:  . Rocephin IV  Anti-infectives (From admission, onward)   Start     Dose/Rate Route Frequency Ordered Stop   05/14/19 0445  cefTRIAXone (ROCEPHIN) 2 g in sodium chloride 0.9 % 100 mL IVPB     2 g 200 mL/hr over 30 Minutes Intravenous Daily 05/14/19 0440        Subjective: Today, patient was seen and examined at bedside.  Denies no bowel movements since yesterday, after presenting to the hospital.  No nausea vomiting or abdominal pain.  Denies any dizziness, lightheadedness or shortness of breath.  On octreotide drip.  Objective: Vitals:  05/15/19 0600 05/15/19 0700  BP: (!) 101/50 (!) 109/52  Pulse: 75 80  Resp: 18 (!) 22  Temp:    SpO2: 93% 95%    Intake/Output Summary (Last 24 hours) at 05/15/2019 0742 Last data filed at 05/15/2019 0042 Gross per 24 hour  Intake 1068.91 ml  Output 805 ml  Net 263.91 ml   Filed Weights   05/14/19 0139  Weight: 83.9 kg   Body mass index is 32.77 kg/m.   Physical Exam: GENERAL: Patient is alert awake and oriented. Not in obvious distress.  Obese HENT: Mild pallor noted.  Pupils equally reactive to light. Oral mucosa is moist NECK: is supple, no gross swelling noted. CHEST: Clear to auscultation. No  crackles or wheezes.  Diminished breath sounds bilaterally. CVS: S1 and S2 heard, no murmur. Regular rate and rhythm.  ABDOMEN: Soft, non-tender, bowel sounds are present. EXTREMITIES: Bilateral lower extremity peripheral edema-trace CNS: Cranial nerves are intact. No focal motor deficits. SKIN: warm and dry without rashes.  Data Review: I have personally reviewed the following laboratory data and studies,  CBC: Recent Labs  Lab 05/14/19 0145 05/14/19 0438 05/14/19 0832 05/14/19 1221 05/15/19 0210  WBC 11.2*  --   --   --  7.4  HGB 13.4 10.9* 10.3* 10.6* 9.4*  HCT 39.6 32.1* 30.4* 31.5* 28.6*  MCV 98.3  --   --   --  99.7  PLT 149*  --   --   --  109*   Basic Metabolic Panel: Recent Labs  Lab 05/14/19 0145 05/15/19 0210  NA 140 142  K 3.9 3.9  CL 105 110  CO2 24 26  GLUCOSE 187* 154*  BUN 31* 14  CREATININE 0.83 0.90  CALCIUM 8.8* 8.0*  MG  --  1.8  PHOS  --  2.9   Liver Function Tests: Recent Labs  Lab 05/14/19 0145 05/15/19 0210  AST 54* 34  ALT 63* 42  ALKPHOS 71 41  BILITOT 1.3* 0.7  PROT 6.7 5.3*  ALBUMIN 3.9 3.1*   No results for input(s): LIPASE, AMYLASE in the last 168 hours. No results for input(s): AMMONIA in the last 168 hours. Cardiac Enzymes: No results for input(s): CKTOTAL, CKMB, CKMBINDEX, TROPONINI in the last 168 hours. BNP (last 3 results) No results for input(s): BNP in the last 8760 hours.  ProBNP (last 3 results) No results for input(s): PROBNP in the last 8760 hours.  CBG: Recent Labs  Lab 05/14/19 1505 05/14/19 1958 05/15/19 0036 05/15/19 0328 05/15/19 0737  GLUCAP 135* 247* 155* 142* 155*   Recent Results (from the past 240 hour(s))  Respiratory Panel by RT PCR (Flu A&B, Covid) - Nasopharyngeal Swab     Status: None   Collection Time: 05/14/19  3:36 AM   Specimen: Nasopharyngeal Swab  Result Value Ref Range Status   SARS Coronavirus 2 by RT PCR NEGATIVE NEGATIVE Final    Comment: (NOTE) SARS-CoV-2 target nucleic  acids are NOT DETECTED. The SARS-CoV-2 RNA is generally detectable in upper respiratoy specimens during the acute phase of infection. The lowest concentration of SARS-CoV-2 viral copies this assay can detect is 131 copies/mL. A negative result does not preclude SARS-Cov-2 infection and should not be used as the sole basis for treatment or other patient management decisions. A negative result may occur with  improper specimen collection/handling, submission of specimen other than nasopharyngeal swab, presence of viral mutation(s) within the areas targeted by this assay, and inadequate number of viral copies (<131 copies/mL). A negative  result must be combined with clinical observations, patient history, and epidemiological information. The expected result is Negative. Fact Sheet for Patients:  https://www.moore.com/ Fact Sheet for Healthcare Providers:  https://www.young.biz/ This test is not yet ap proved or cleared by the [place] FDA and  has been authorized for detection and/or diagnosis of SARS-CoV-2 by FDA under an Emergency Use Authorization (EUA). This EUA will remain  in effect (meaning this test can be used) for the duration of the COVID-19 declaration under Section 564(b)(1) of the Act, 21 U.S.C. section 360bbb-3(b)(1), unless the authorization is terminated or revoked sooner.    Influenza A by PCR NEGATIVE NEGATIVE Final   Influenza B by PCR NEGATIVE NEGATIVE Final    Comment: (NOTE) The Xpert Xpress SARS-CoV-2/FLU/RSV assay is intended as an aid in  the diagnosis of influenza from Nasopharyngeal swab specimens and  should not be used as a sole basis for treatment. Nasal washings and  aspirates are unacceptable for Xpert Xpress SARS-CoV-2/FLU/RSV  testing. Fact Sheet for Patients: https://www.moore.com/ Fact Sheet for Healthcare Providers: https://www.young.biz/ This test is not yet  approved or cleared by the [place] FDA and  has been authorized for detection and/or diagnosis of SARS-CoV-2 by  FDA under an Emergency Use Authorization (EUA). This EUA will remain  in effect (meaning this test can be used) for the duration of the  Covid-19 declaration under Section 564(b)(1) of the Act, 21  U.S.C. section 360bbb-3(b)(1), unless the authorization is  terminated or revoked. Performed at [facility], [address], [place], [place] [address]   MRSA PCR Screening     Status: None   Collection Time: 05/14/19  7:00 AM   Specimen: Nasal Mucosa; Nasopharyngeal  Result Value Ref Range Status   MRSA by PCR NEGATIVE NEGATIVE Final    Comment:        The GeneXpert MRSA Assay (FDA approved for NASAL specimens only), is one component of a comprehensive MRSA colonization surveillance program. It is not intended to diagnose MRSA infection nor to guide or monitor treatment for MRSA infections. Performed at [facility], [address], [place], [place] [address]      Studies: No results found.    Joycelyn Das, MD  [facility] 05/15/2019

## 2019-05-15 NOTE — TOC Progression Note (Signed)
Transition of Care [facility]) - Progression Note    Patient Details  Name: Hunter Gray MRN: [id number] Date of Birth: Mar 10, 1970  [facility] of [facility] CM/SW Contact  Golda Acre, RN Phone Number: 05/15/2019, 8:10 AM  Clinical Narrative:     Patient aware of home care.   Expected Discharge Plan: Home/Self Care Barriers to Discharge: Continued Medical Work up  Expected Discharge Plan and Services Expected Discharge Plan: Home/Self Care   Discharge Planning Services: CM Consult   Living arrangements for the past 2 months: Single Family Home                                       Social Determinants of Health (SDOH) Interventions    Readmission Risk Interventions No flowsheet data found.

## 2019-05-15 NOTE — Progress Notes (Signed)
[facility] Progress Note  Hunter Gray 49 y.o. Sep 10, 1970  CC:  Hematemesis/melena (resolved)  Subjective: Patient endorses some epigastric discomfort this morning.  Has not had any bowel movements since the procedure (last bowel movement was 5/8) and has not had any further episodes of hematemesis.  Denies nausea, chest pain, shortness of breath, odynophagia, dizziness.  He is tolerated a clear liquid diet.    ROS : Review of Systems  Respiratory: Negative for cough and shortness of breath.   Cardiovascular: Negative for chest pain and palpitations.  Gastrointestinal: Positive for abdominal pain (epigastric). Negative for blood in stool, constipation, diarrhea, heartburn, melena, nausea and vomiting.   Objective: Vital signs in last 24 hours: Vitals:   05/15/19 0700 05/15/19 0815  BP: (!) 109/52 (!) 93/50  Pulse: 80 76  Resp: (!) 22 19  Temp:    SpO2: 95% 94%    Physical Exam:  General:  Alert, lying in bed, cooperative, no distress, appears stated age  Head:  Normocephalic, without obvious abnormality, atraumatic  Eyes:  Mild conjunctival pallor, anicteric sclera, EOMs intact  Lungs:   Clear to auscultation bilaterally, respirations unlabored  Heart:  Regular rate and rhythm, S1, S2 normal  Abdomen:   Soft, mild epigastric tenderness to palpation, bowel sounds active all four quadrants,  no masses, no peritoneal signs  Extremities: Trace bilateral lower extremity edema  Pulses: 2+ and symmetric    Lab Results: Recent Labs    05/14/19 0145 05/15/19 0210  NA 140 142  K 3.9 3.9  CL 105 110  CO2 24 26  GLUCOSE 187* 154*  BUN 31* 14  CREATININE 0.83 0.90  CALCIUM 8.8* 8.0*  MG  --  1.8  PHOS  --  2.9   Recent Labs    05/14/19 0145 05/15/19 0210  AST 54* 34  ALT 63* 42  ALKPHOS 71 41  BILITOT 1.3* 0.7  PROT 6.7 5.3*  ALBUMIN 3.9 3.1*   Recent Labs    05/14/19 0145 05/14/19 0438 05/14/19 1221 05/15/19 0210  WBC 11.2*  --   --  7.4   HGB 13.4   < > 10.6* 9.4*  HCT 39.6   < > 31.5* 28.6*  MCV 98.3  --   --  99.7  PLT 149*  --   --  109*   < > = values in this interval not displayed.   Recent Labs    05/14/19 0213 05/15/19 0210  LABPROT 15.9* 15.6*  INR 1.3* 1.3*      Assessment: Patient is a 49 y.o. male with history of alcohol related cirrhosis (dx 2019) who presented with hematemesis and melena.  MELD score of 9 as of 05/15/19. -EGD yesterday revealed: grade II varices (banded, completely eradicated), non-bleeding gastric ulcers with a clean ulcer base (Forrest Class III), and portal hypertensive gastropathy. -Hgb 9.4 today, decreased from 10.6 yesterday -BUN normalized, now 14, with Cr 0.90 -Mildly hypotensive today but regular heart rate and asymptomatic -No further signs of GI bleeding -T bili 0.7/AST 34/ALT 42/Alk phos 41  Plan: -OK to discontinue IV Octreotide at 12:00 today -Continue Protonix 32m IV BID -Continue to monitor H&H with transfusion as needed to maintain Hgb >7 -Full liquid diet ordered, advance as tolerated -Continue complete alcohol avoidance -Avoid NSAIDs -If rebleeding occurs, patient would benefit from TIPS  [facility] will follow.  ASalley SlaughterPA-C 05/15/2019, 9:25 AM  Contact #  [phone]

## 2019-05-16 DIAGNOSIS — I959 Hypotension, unspecified: Secondary | ICD-10-CM

## 2019-05-16 LAB — COMPREHENSIVE METABOLIC PANEL
ALT: 38 U/L (ref 0–44)
AST: 31 U/L (ref 15–41)
Albumin: 3.1 g/dL — ABNORMAL LOW (ref 3.5–5.0)
Alkaline Phosphatase: 46 U/L (ref 38–126)
Anion gap: 7 (ref 5–15)
BUN: 10 mg/dL (ref 6–20)
CO2: 26 mmol/L (ref 22–32)
Calcium: 8.1 mg/dL — ABNORMAL LOW (ref 8.9–10.3)
Chloride: 104 mmol/L (ref 98–111)
Creatinine, Ser: 0.77 mg/dL (ref 0.61–1.24)
GFR calc Af Amer: >60 mL/min (ref >60)
GFR calc non Af Amer: >60 mL/min (ref >60)
Glucose, Bld: 120 mg/dL — ABNORMAL HIGH (ref 70–99)
Potassium: 3.8 mmol/L (ref 3.5–5.1)
Sodium: 137 mmol/L (ref 135–145)
Total Bilirubin: 0.7 mg/dL (ref 0.3–1.2)
Total Protein: 5.6 g/dL — ABNORMAL LOW (ref 6.5–8.1)

## 2019-05-16 LAB — CBC
HCT: 27.9 % — ABNORMAL LOW (ref 39.0–52.0)
Hemoglobin: 9.5 g/dL — ABNORMAL LOW (ref 13.0–17.0)
MCH: 33.7 pg (ref 26.0–34.0)
MCHC: 34.1 g/dL (ref 30.0–36.0)
MCV: 98.9 fL (ref 80.0–100.0)
Platelets: 101 10*3/uL — ABNORMAL LOW (ref 150–400)
RBC: 2.82 MIL/uL — ABNORMAL LOW (ref 4.22–5.81)
RDW: 13.7 % (ref 11.5–15.5)
WBC: 6.5 10*3/uL (ref 4.0–10.5)
nRBC: 0 % (ref 0.0–0.2)

## 2019-05-16 LAB — GLUCOSE, CAPILLARY
Glucose-Capillary: 129 mg/dL — ABNORMAL HIGH (ref 70–99)
Glucose-Capillary: 142 mg/dL — ABNORMAL HIGH (ref 70–99)
Glucose-Capillary: 97 mg/dL (ref 70–99)

## 2019-05-16 LAB — PHOSPHORUS: Phosphorus: 3.9 mg/dL (ref 2.5–4.6)

## 2019-05-16 LAB — MAGNESIUM: Magnesium: 1.8 mg/dL (ref 1.7–2.4)

## 2019-05-16 MED ORDER — SODIUM CHLORIDE 0.9 % IV BOLUS
500.0000 mL | Freq: Once | INTRAVENOUS | Status: AC
  Administered 2019-05-16: 500 mL via INTRAVENOUS

## 2019-05-16 NOTE — Progress Notes (Signed)
[facility] Gastroenterology Progress Note  Hunter Gray 49 y.o. 1970/03/28  CC: Hematemesis/melena (resolved)   Subjective: Patient reports he is feeling "good" this morning.  Denies any nausea, vomiting, hematemesis.  Has not had a bowel movement since EGD and denies any rectal bleeding.  Endorses ongoing mild epigastric pain.  Notes that his blood pressure is lower than it typically is at home and he occasionally sees some spots in his vision.  Denies dizziness or presyncope.  ROS : Review of Systems  Respiratory: Negative for cough and shortness of breath.   Cardiovascular: Negative for chest pain and palpitations.  Gastrointestinal: Positive for abdominal pain (mild, epigastric). Negative for blood in stool, constipation, diarrhea, heartburn, melena, nausea and vomiting.   Objective: Vital signs in last 24 hours: Vitals:   05/16/19 1025 05/16/19 1030  BP: (!) 111/54 (!) 113/55  Pulse: 74 74  Resp: 16 20  Temp:    SpO2: 92% 92%    Physical Exam:  General:  Sleeping comfortably but easily arouses to voice alone, cooperative, no acute distress  Head:  Normocephalic, without obvious abnormality, atraumatic  Eyes:  Mild conjunctival pallor, EOMs intact  Lungs:   Clear to auscultation bilaterally, respirations unlabored  Heart:  Regular rate and rhythm, S1, S2 normal  Abdomen:   Soft, mild epigastric tenderness to palpation, bowel sounds active all four quadrants,  no masses, no guarding or peritoneal signs  Extremities: Trace bilateral lower extremity edema  Pulses: 2+ and symmetric    Lab Results: Recent Labs    05/15/19 0210 05/16/19 0208  NA 142 137  K 3.9 3.8  CL 110 104  CO2 26 26  GLUCOSE 154* 120*  BUN 14 10  CREATININE 0.90 0.77  CALCIUM 8.0* 8.1*  MG 1.8 1.8  PHOS 2.9 3.9   Recent Labs    05/15/19 0210 05/16/19 0208  AST 34 31  ALT 42 38  ALKPHOS 41 46  BILITOT 0.7 0.7  PROT 5.3* 5.6*  ALBUMIN 3.1* 3.1*   Recent Labs    05/15/19 0210  05/16/19 0208  WBC 7.4 6.5  HGB 9.4* 9.5*  HCT 28.6* 27.9*  MCV 99.7 98.9  PLT 109* 101*   Recent Labs    05/14/19 0213 05/15/19 0210  LABPROT 15.9* 15.6*  INR 1.3* 1.3*   Assessment: Patient is a 49 y.o.malewith history of alcohol related cirrhosis (dx2019) who presented with hematemesis and melena.  MELD score of 9 as of 05/15/19. -EGD 05/14/19 revealed: grade II varices (banded, completely eradicated), non-bleeding gastric ulcers with a clean ulcer base (Forrest Class III), and portal hypertensive gastropathy. -Hgb 9.5 today, stable compared to 9.4 yesterday -BUN normalized, now 10, with Cr 0.77 -Mildly hypotensive today (110s/50s) but regular heart rate (70s) -No further signs of GI bleeding -T bili 0.7/AST 31/ALT 38/Alk phos 46  Plan: -Continue Protonix 60m IV BID -Continue to monitor H&H with transfusion as needed to maintain Hgb >7 -Continue complete alcohol avoidance -Avoid NSAIDs -If rebleeding occurs, patient would benefit from TIPS  [facility] will follow.  ASalley SlaughterPA-C 05/16/2019, 12:25 PM  Contact #  [phone]

## 2019-05-16 NOTE — Progress Notes (Signed)
PROGRESS NOTE  Hunter Gray HMC:[id number] DOB: [birth year] DOA: 05/14/2019 PCP: Farris Has, MD   LOS: 2 days   Brief narrative: As per HPI,  Hunter Gray is a 49 y.o. male with medical history significant of liver cirrhosis, type 2 diabetes presented to the hospital with hematemesis and melena.  Patient does have history of liver cirrhosis and follows up with Dr. Doretha Imus.  States he had an endoscopy done 6 months ago which showed varices for which his gastroenterologist had recommended a procedure.  He is not aware of any history of gastric ulcers.  He had 2 Advils one day prior to presentation for a headache but does not use NSAIDs on a regular basis otherwise.  ED Course: Patient had a near syncopal episode at triage. He was mildly hypotensive.  Hemoglobin at 13.4.  FOBT positive.  Transaminases mildly elevated and no significant change compared to prior labs.  Total bili 1.3, was 0.4 on prior labs.  SARS-CoV-2 PCR was negative.  Patient was then admitted to hospital for upper GI bleed.  Assessment/Plan:  Principal Problem:   Upper GI bleed Active Problems:   Liver cirrhosis (HCC)   Insulin dependent type 2 diabetes mellitus (HCC)  Upper GI bleed with hematemesis melena.  Near syncopal episode with mild hypotension on presentation. status post endoscopy on 05/14/2019 with findings of esophageal varices and 4 bands were placed in.  Had non bleeding gastric ulcer with clean base and portal hypertensive gastropathy. Hemoglobin of 9.5 today from 9.4 yesterday. Off octreotide drip since 05/15/2019 noon.no further bleeding episodes.  Continue Protonix twice a day.  Seen by GI today.  Spoke with Dr. Dulce Sellar and he recommended observation for at least 1 more day.  Continue IV Protonix twice a day.  Patient was counseled against NSAIDs.  Follows up with Dr. L[facility] as outpatient.  Continue Rocephin IV for prophylaxis.  Transfuse for ongoing bleeding or hemoglobin less than 7.   Will  advance diet to soft from full liquids today.    Hypotension.  Symptomatic with dizziness.  No drop in blood pressure on standing.  Will give normal saline 500 mL x 1 today.  We will continue to ambulate the patient as tolerated.  Cirrhosis of liver with portal hypertension.  Will need follow-up with GI as outpatient.  Follow GI recommendations on discharge  Diabetes mellitus type 2.    Continue sliding scale insulin  Hemoglobin A1c of 6.9.  Closely monitor.  Last POC glucose of 143.  VTE Prophylaxis: Sequential compression device due to GI bleed  Code Status: Full code  Family Communication: I again spoke with the patient's spouse at bedside today.  Status is: Inpatient  Remains inpatient appropriate because:  closer monitoring after GI bleed, hypotension, status post endoscopic procedure.  Monitor CBC in a.m.  GI recommends 1 more day of observation.  Dispo: The patient is from: Home              Anticipated d/c is to: Home              Anticipated d/c date is: 1 day if hemoglobin stable and blood pressure okay.              Patient currently is not medically stable to d/c.   Consultants:  Gastroenterology  Procedures:  Esophagogastroduodenoscopy with variceal banding on 05/14/2019.  Antibiotics:  . Rocephin IV  Anti-infectives (From admission, onward)   Start     Dose/Rate Route Frequency Ordered Stop  05/14/19 0445  cefTRIAXone (ROCEPHIN) 2 g in sodium chloride 0.9 % 100 mL IVPB     2 g 200 mL/hr over 30 Minutes Intravenous Daily 05/14/19 0440        Subjective: The, patient was seen and examined at bedside.  Stated that he was little dizzy on ambulating initially but he ambulated secondary with nursing staff again and felt better.  His blood pressure was however low.  Denies any chest pain.  No further bowel movements or bleeding..  Objective: Vitals:   05/16/19 1400 05/16/19 1500  BP: (!) 80/34 (!) 81/39  Pulse: 69 66  Resp: 20 12  Temp:    SpO2: 95% 94%     Intake/Output Summary (Last 24 hours) at 05/16/2019 1517 Last data filed at 05/16/2019 1000 Gross per 24 hour  Intake 340 ml  Output 1000 ml  Net -660 ml   Filed Weights   05/14/19 0139  Weight: 83.9 kg   Body mass index is 32.77 kg/m.   Physical Exam: GENERAL: Patient is alert awake and oriented. Not in obvious distress.  Obese HENT: Mild pallor noted.  Pupils equally reactive to light. Oral mucosa is moist NECK: is supple, no gross swelling noted. CHEST: Clear to auscultation. No crackles or wheezes.  Diminished breath sounds bilaterally. CVS: S1 and S2 heard, no murmur. Regular rate and rhythm.  ABDOMEN: Soft, non-tender, bowel sounds are present. EXTREMITIES: Trace edema CNS: Cranial nerves are intact. No focal motor deficits. SKIN: warm and dry without rashes.  Data Review: I have personally reviewed the following laboratory data and studies,  CBC: Recent Labs  Lab 05/14/19 0145 05/14/19 0145 05/14/19 0438 05/14/19 0832 05/14/19 1221 05/15/19 0210 05/16/19 0208  WBC 11.2*  --   --   --   --  7.4 6.5  HGB 13.4   < > 10.9* 10.3* 10.6* 9.4* 9.5*  HCT 39.6   < > 32.1* 30.4* 31.5* 28.6* 27.9*  MCV 98.3  --   --   --   --  99.7 98.9  PLT 149*  --   --   --   --  109* 101*   < > = values in this interval not displayed.   Basic Metabolic Panel: Recent Labs  Lab 05/14/19 0145 05/15/19 0210 05/16/19 0208  NA 140 142 137  K 3.9 3.9 3.8  CL 105 110 104  CO2 24 26 26   GLUCOSE 187* 154* 120*  BUN 31* 14 10  CREATININE 0.83 0.90 0.77  CALCIUM 8.8* 8.0* 8.1*  MG  --  1.8 1.8  PHOS  --  2.9 3.9   Liver Function Tests: Recent Labs  Lab 05/14/19 0145 05/15/19 0210 05/16/19 0208  AST 54* 34 31  ALT 63* 42 38  ALKPHOS 71 41 46  BILITOT 1.3* 0.7 0.7  PROT 6.7 5.3* 5.6*  ALBUMIN 3.9 3.1* 3.1*   No results for input(s): LIPASE, AMYLASE in the last 168 hours. No results for input(s): AMMONIA in the last 168 hours. Cardiac Enzymes: No results for input(s):  CKTOTAL, CKMB, CKMBINDEX, TROPONINI in the last 168 hours. BNP (last 3 results) No results for input(s): BNP in the last 8760 hours.  ProBNP (last 3 results) No results for input(s): PROBNP in the last 8760 hours.  CBG: Recent Labs  Lab 05/15/19 0737 05/15/19 1132 05/15/19 1633 05/16/19 0749 05/16/19 1303  GLUCAP 155* 143* 161* 129* 142*   Recent Results (from the past 240 hour(s))  Respiratory Panel by RT PCR (Flu A&B,  Covid) - Nasopharyngeal Swab     Status: None   Collection Time: 05/14/19  3:36 AM   Specimen: Nasopharyngeal Swab  Result Value Ref Range Status   SARS Coronavirus 2 by RT PCR NEGATIVE NEGATIVE Final    Comment: (NOTE) SARS-CoV-2 target nucleic acids are NOT DETECTED. The SARS-CoV-2 RNA is generally detectable in upper respiratoy specimens during the acute phase of infection. The lowest concentration of SARS-CoV-2 viral copies this assay can detect is 131 copies/mL. A negative result does not preclude SARS-Cov-2 infection and should not be used as the sole basis for treatment or other patient management decisions. A negative result may occur with  improper specimen collection/handling, submission of specimen other than nasopharyngeal swab, presence of viral mutation(s) within the areas targeted by this assay, and inadequate number of viral copies (<131 copies/mL). A negative result must be combined with clinical observations, patient history, and epidemiological information. The expected result is Negative. Fact Sheet for Patients:  https://www.moore.com/ Fact Sheet for Healthcare Providers:  https://www.young.biz/ This test is not yet ap proved or cleared by the [place] FDA and  has been authorized for detection and/or diagnosis of SARS-CoV-2 by FDA under an Emergency Use Authorization (EUA). This EUA will remain  in effect (meaning this test can be used) for the duration of the COVID-19 declaration under  Section 564(b)(1) of the Act, 21 U.S.C. section 360bbb-3(b)(1), unless the authorization is terminated or revoked sooner.    Influenza A by PCR NEGATIVE NEGATIVE Final   Influenza B by PCR NEGATIVE NEGATIVE Final    Comment: (NOTE) The Xpert Xpress SARS-CoV-2/FLU/RSV assay is intended as an aid in  the diagnosis of influenza from Nasopharyngeal swab specimens and  should not be used as a sole basis for treatment. Nasal washings and  aspirates are unacceptable for Xpert Xpress SARS-CoV-2/FLU/RSV  testing. Fact Sheet for Patients: https://www.moore.com/ Fact Sheet for Healthcare Providers: https://www.young.biz/ This test is not yet approved or cleared by the [place] FDA and  has been authorized for detection and/or diagnosis of SARS-CoV-2 by  FDA under an Emergency Use Authorization (EUA). This EUA will remain  in effect (meaning this test can be used) for the duration of the  Covid-19 declaration under Section 564(b)(1) of the Act, 21  U.S.C. section 360bbb-3(b)(1), unless the authorization is  terminated or revoked. Performed at [facility], [address] [place], [place] [address]   MRSA PCR Screening     Status: None   Collection Time: 05/14/19  7:00 AM   Specimen: Nasal Mucosa; Nasopharyngeal  Result Value Ref Range Status   MRSA by PCR NEGATIVE NEGATIVE Final    Comment:        The GeneXpert MRSA Assay (FDA approved for NASAL specimens only), is one component of a comprehensive MRSA colonization surveillance program. It is not intended to diagnose MRSA infection nor to guide or monitor treatment for MRSA infections. Performed at [facility], [address] [place], [place] [address]      Studies: No results found.   Joycelyn Das, MD  [facility] 05/16/2019

## 2019-05-16 NOTE — TOC Progression Note (Signed)
Transition of Care [facility]) - Progression Note    Patient Details  Name: Hunter Gray MRN: [id number] Date of Birth: 10-13-1970  [facility]) CM/SW Contact  Golda Acre, RN Phone Number: 05/16/2019, 8:02 AM  Clinical Narrative:    [id number]/remains on the sandostatin drip and iv rocephin, hgb stable at 9.5, no further bleeding has been reported.   Expected Discharge Plan: Home/Self Care Barriers to Discharge: Continued Medical Work up  Expected Discharge Plan and Services Expected Discharge Plan: Home/Self Care   Discharge Planning Services: CM Consult   Living arrangements for the past 2 months: Single Family Home                                       Social Determinants of Health (SDOH) Interventions    Readmission Risk Interventions No flowsheet data found.

## 2019-05-17 DIAGNOSIS — D500 Iron deficiency anemia secondary to blood loss (chronic): Secondary | ICD-10-CM

## 2019-05-17 DIAGNOSIS — R55 Syncope and collapse: Secondary | ICD-10-CM

## 2019-05-17 DIAGNOSIS — D696 Thrombocytopenia, unspecified: Secondary | ICD-10-CM

## 2019-05-17 LAB — BASIC METABOLIC PANEL
Anion gap: 7 (ref 5–15)
BUN: 7 mg/dL (ref 6–20)
CO2: 27 mmol/L (ref 22–32)
Calcium: 8.1 mg/dL — ABNORMAL LOW (ref 8.9–10.3)
Chloride: 102 mmol/L (ref 98–111)
Creatinine, Ser: 0.91 mg/dL (ref 0.61–1.24)
GFR calc Af Amer: >60 mL/min (ref >60)
GFR calc non Af Amer: >60 mL/min (ref >60)
Glucose, Bld: 162 mg/dL — ABNORMAL HIGH (ref 70–99)
Potassium: 3.7 mmol/L (ref 3.5–5.1)
Sodium: 136 mmol/L (ref 135–145)

## 2019-05-17 LAB — GLUCOSE, CAPILLARY
Glucose-Capillary: 135 mg/dL — ABNORMAL HIGH (ref 70–99)
Glucose-Capillary: 200 mg/dL — ABNORMAL HIGH (ref 70–99)
Glucose-Capillary: 175 mg/dL — ABNORMAL HIGH (ref 70–99)

## 2019-05-17 LAB — CBC
HCT: 27.2 % — ABNORMAL LOW (ref 39.0–52.0)
Hemoglobin: 9.1 g/dL — ABNORMAL LOW (ref 13.0–17.0)
MCH: 32.7 pg (ref 26.0–34.0)
MCHC: 33.5 g/dL (ref 30.0–36.0)
MCV: 97.8 fL (ref 80.0–100.0)
Platelets: 113 10*3/uL — ABNORMAL LOW (ref 150–400)
RBC: 2.78 MIL/uL — ABNORMAL LOW (ref 4.22–5.81)
RDW: 13.5 % (ref 11.5–15.5)
WBC: 6.5 10*3/uL (ref 4.0–10.5)
nRBC: 0 % (ref 0.0–0.2)

## 2019-05-17 LAB — MAGNESIUM: Magnesium: 2 mg/dL (ref 1.7–2.4)

## 2019-05-17 MED ORDER — SODIUM CHLORIDE 0.9 % IV BOLUS
500.0000 mL | Freq: Once | INTRAVENOUS | Status: AC
  Administered 2019-05-17: 500 mL via INTRAVENOUS

## 2019-05-17 NOTE — Progress Notes (Signed)
Pharmacy to Physician Communication:  Canagliflozin (formulary substitute for empagliflozin) contraindications:  Urinary tract infection   Acute renal failure   eGFR less than 45 XT/KWI/0.97 m2   Metabolic acidosis (including Diabetic ketoacidosis))  NPO status   Dehydration   Volume depletion - requiring NS boluses for symptomatic hypotension  Peggyann Juba, PharmD, BCPS Pharmacy: [phone] 05/17/2019 8:26 AM

## 2019-05-17 NOTE — Progress Notes (Signed)
[facility] Gastroenterology Progress Note  Hunter Gray 49 y.o. 1970/10/25  CC:  Hematemesis/melena (resolved)   Subjective: Patient reports feeling well this morning.  He denies any nausea, vomiting, hematemesis, abdominal pain.  He has not had a bowel movement yet, but he has been passing flatus.  He does not feel constipated and does not currently want to try laxatives or bowel regimen.  He is tolerating a soft diet.  Endorses some concern about his blood pressure being lower than baseline, stating it got as low as 90s over 40s yesterday, and he noted some dizziness and shortness of breath with ambulation.  ROS : Review of Systems  Constitutional: Negative for chills and fever.  Cardiovascular: Negative for chest pain and palpitations.  Gastrointestinal: Negative for abdominal pain, blood in stool, constipation, diarrhea, heartburn, melena, nausea and vomiting.    Objective: Vital signs in last 24 hours: Vitals:   05/17/19 0700 05/17/19 0800  BP:  (!) 112/55  Pulse: 67 76  Resp: 12 11  Temp:  99.7 F (37.6 C)  SpO2: 93% 95%    Physical Exam:  General:  Alert, cooperative, no distress, appears stated age  Head:  Normocephalic, without obvious abnormality, atraumatic  Eyes:  Anicteric sclera, EOMs intact,   Lungs:   Clear to auscultation bilaterally, respirations unlabored  Heart:  Regular rate and rhythm, S1, S2 normal  Abdomen:   Soft, non-tender, bowel sounds mildly increased x 4 quadrants,  No peritoneal signs   Extremities: Extremities normal, atraumatic, no  edema  Pulses: 2+ and symmetric    Lab Results: Recent Labs    05/15/19 0210 05/15/19 0210 05/16/19 0208 05/17/19 0206  NA 142   < > 137 136  K 3.9   < > 3.8 3.7  CL 110   < > 104 102  CO2 26   < > 26 27  GLUCOSE 154*   < > 120* 162*  BUN 14   < > 10 7  CREATININE 0.90   < > 0.77 0.91  CALCIUM 8.0*   < > 8.1* 8.1*  MG 1.8   < > 1.8 2.0  PHOS 2.9  --  3.9  --    < > = values in this interval not  displayed.   Recent Labs    05/15/19 0210 05/16/19 0208  AST 34 31  ALT 42 38  ALKPHOS 41 46  BILITOT 0.7 0.7  PROT 5.3* 5.6*  ALBUMIN 3.1* 3.1*   Recent Labs    05/16/19 0208 05/17/19 0206  WBC 6.5 6.5  HGB 9.5* 9.1*  HCT 27.9* 27.2*  MCV 98.9 97.8  PLT 101* 113*   Recent Labs    05/15/19 0210  LABPROT 15.6*  INR 1.3*    Assessment: Patient isa 49 y.o.malewith history of alcohol related cirrhosis(dx2019) who presented withhematemesis and melena.MELD score of 9 as of 05/15/19. -EGD 05/14/19 revealed: grade II varices (banded, completely eradicated), non-bleeding gastric ulcers with a clean ulcer base (Forrest Class III), and portal hypertensive gastropathy. -Hgb 9.1 today, stable compared to 9.5 yesterday -BUN 7/ Cr 0.91 -Continues to be mildly hypotensive (110s/50s) but regular heart rate (70s) -No further signs of GI bleeding  Plan: -Continue Protonix 40mg  PO BID -Continue Nadolol 20mg  daily -Discussed importance of contined complete alcohol avoidance -Advised patient to avoid NSAIDs  OK to discharge from GI standpoint.  We will arrange follow-up with Dr. [facility]).  [facility] will sign off. Please contact Levora Angel if we can be of any  further assistance during this hospital stay.   Salley Slaughter PA-C 05/17/2019, 9:48 AM  Contact #  [phone]

## 2019-05-17 NOTE — Progress Notes (Signed)
Marland Kitchen  PROGRESS NOTE    Hunter Gray  WUJ:[id number] DOB: [birth year] DOA: 05/14/2019 PCP: London Pepper, MD   Brief Narrative:   Hunter Cruise Vasquezis a 49 y.o.malewith medical history significant ofliver cirrhosis, type 2 diabetes presented to the hospital with hematemesis and melena.  Patient does have history of liver cirrhosis and follows up with Dr. Luan Pulling.States he had an endoscopy done 6 months ago which showed varices for which his gastroenterologist had recommended a procedure. He is not aware of any history of gastric ulcers. He had 2 Advilsone day prior to presentation for aheadache but does not use NSAIDs on a regular basis otherwise. ED Course:Patient had a near syncopal episode at triage. He was mildly hypotensive. Hemoglobin at 13.4. FOBT positive. Transaminases mildly elevated and no significant change compared to prior labs. Total bili 1.3, was 0.4 on prior labs. SARS-CoV-2 PCR was negative.  Patient was then admitted to hospital for upper GI bleed.   Assessment & Plan:   Principal Problem:   Upper GI bleed Active Problems:   Liver cirrhosis (HCC)   Insulin dependent type 2 diabetes mellitus (HCC)  Upper GI bleed with hematemesis melena. Near syncopal episode Mild hypotension     - status post endoscopy on 05/14/2019 with findings of esophageal varices and 4 bands were placed in.       - Had non bleeding gastric ulcer with clean base and portal hypertensive gastropathy.      - Off octreotide drip since 05/15/2019 noon.     - No further bleeding episodes.     - 5/12: Hgb is stable at 9.1     - GI has s/o'd w/ following recs:         - Continue protonix 40mg  BID at discharge         - Continue nadolol 20mg  qday at discharge         - avoid EtOH and NSAIDs     - he has symptomatic hypotension; will bolus fluids today; continue ambulation as tolerated  Cirrhosis of liver with portal hypertension     - nadolol 20mg  qday at discharge, follow up with  GI  Diabetes mellitus type 2.        - Continue sliding scale insulin, metformin     - Hemoglobin A1c of 6.9.     - follow up with PCP at discharge  Thrombocytopenia     - no new evidence of bleen     - likely secondaryt o liver disease     - plts trending up     - monitor  DVT prophylaxis: SCDs Code Status: FULL Family Communication: Spoke with wife at bedside   Status is: Inpatient  Remains inpatient appropriate because:still symptomatically hypotensive requiring fluid boluses.   Dispo: The patient is from: Home              Anticipated d/c is to: Home              Anticipated d/c date is: 1 day              Patient currently is not medically stable to d/c.  Consultants:   GI  Procedures:   EGD  Antimicrobials:  . Rocephin   ROS:  Reports dizziness w/ walking, mild pain w/ eating. Remainder 10-pt ROS is negative for all not previously mentioned.  Subjective: "It's been low a couple of days now."  Objective: Vitals:   05/17/19 0500 05/17/19 0600 05/17/19 0700 05/17/19 0800  BP:  (!) 99/38  (!) 112/55  Pulse: 71 68 67 76  Resp: (!) 30 12 12 11   Temp:    99.7 F (37.6 C)  TempSrc:    Oral  SpO2: 94% 92% 93% 95%  Weight:      Height:        Intake/Output Summary (Last 24 hours) at 05/17/2019 1037 Last data filed at 05/17/2019 0600 Gross per 24 hour  Intake 1713.66 ml  Output 1100 ml  Net 613.66 ml   Filed Weights   05/14/19 0139  Weight: 83.9 kg    Examination:  General: 49 y.o. male resting in bed in NAD Cardiovascular: RRR, +S1, S2, no m/g/r, equal pulses throughout Respiratory: CTABL, no w/r/r, normal WOB GI: BS+, NDNT, no masses noted, no organomegaly noted MSK: No e/c/c Skin: No rashes, bruises, ulcerations noted Neuro: A&O x 3, no focal deficits Psyc: Appropriate interaction and affect, calm/cooperative   Data Reviewed: I have personally reviewed following labs and imaging studies.  CBC: Recent Labs  Lab 05/14/19 0145  05/14/19 0438 05/14/19 0832 05/14/19 1221 05/15/19 0210 05/16/19 0208 05/17/19 0206  WBC 11.2*  --   --   --  7.4 6.5 6.5  HGB 13.4   < > 10.3* 10.6* 9.4* 9.5* 9.1*  HCT 39.6   < > 30.4* 31.5* 28.6* 27.9* 27.2*  MCV 98.3  --   --   --  99.7 98.9 97.8  PLT 149*  --   --   --  109* 101* 113*   < > = values in this interval not displayed.   Basic Metabolic Panel: Recent Labs  Lab 05/14/19 0145 05/15/19 0210 05/16/19 0208 05/17/19 0206  NA 140 142 137 136  K 3.9 3.9 3.8 3.7  CL 105 110 104 102  CO2 24 26 26 27   GLUCOSE 187* 154* 120* 162*  BUN 31* 14 10 7   CREATININE 0.83 0.90 0.77 0.91  CALCIUM 8.8* 8.0* 8.1* 8.1*  MG  --  1.8 1.8 2.0  PHOS  --  2.9 3.9  --    GFR: Estimated Creatinine Clearance: 94 mL/min (by C-G formula based on SCr of 0.91 mg/dL). Liver Function Tests: Recent Labs  Lab 05/14/19 0145 05/15/19 0210 05/16/19 0208  AST 54* 34 31  ALT 63* 42 38  ALKPHOS 71 41 46  BILITOT 1.3* 0.7 0.7  PROT 6.7 5.3* 5.6*  ALBUMIN 3.9 3.1* 3.1*   No results for input(s): LIPASE, AMYLASE in the last 168 hours. No results for input(s): AMMONIA in the last 168 hours. Coagulation Profile: Recent Labs  Lab 05/14/19 0213 05/15/19 0210  INR 1.3* 1.3*   Cardiac Enzymes: No results for input(s): CKTOTAL, CKMB, CKMBINDEX, TROPONINI in the last 168 hours. BNP (last 3 results) No results for input(s): PROBNP in the last 8760 hours. HbA1C: No results for input(s): HGBA1C in the last 72 hours. CBG: Recent Labs  Lab 05/15/19 1633 05/16/19 0749 05/16/19 1303 05/16/19 1741 05/17/19 0721  GLUCAP 161* 129* 142* 97 135*   Lipid Profile: No results for input(s): CHOL, HDL, LDLCALC, TRIG, CHOLHDL, LDLDIRECT in the last 72 hours. Thyroid Function Tests: No results for input(s): TSH, T4TOTAL, FREET4, T3FREE, THYROIDAB in the last 72 hours. Anemia Panel: No results for input(s): VITAMINB12, FOLATE, FERRITIN, TIBC, IRON, RETICCTPCT in the last 72 hours. Sepsis Labs: No  results for input(s): PROCALCITON, LATICACIDVEN in the last 168 hours.  Recent Results (from the past 240 hour(s))  Respiratory Panel by RT PCR (Flu A&B, Covid) -  Nasopharyngeal Swab     Status: None   Collection Time: 05/14/19  3:36 AM   Specimen: Nasopharyngeal Swab  Result Value Ref Range Status   SARS Coronavirus 2 by RT PCR NEGATIVE NEGATIVE Final    Comment: (NOTE) SARS-CoV-2 target nucleic acids are NOT DETECTED. The SARS-CoV-2 RNA is generally detectable in upper respiratoy specimens during the acute phase of infection. The lowest concentration of SARS-CoV-2 viral copies this assay can detect is 131 copies/mL. A negative result does not preclude SARS-Cov-2 infection and should not be used as the sole basis for treatment or other patient management decisions. A negative result may occur with  improper specimen collection/handling, submission of specimen other than nasopharyngeal swab, presence of viral mutation(s) within the areas targeted by this assay, and inadequate number of viral copies (<131 copies/mL). A negative result must be combined with clinical observations, patient history, and epidemiological information. The expected result is Negative. Fact Sheet for Patients:  https://www.moore.com/ Fact Sheet for Healthcare Providers:  https://www.young.biz/ This test is not yet ap proved or cleared by the [place] FDA and  has been authorized for detection and/or diagnosis of SARS-CoV-2 by FDA under an Emergency Use Authorization (EUA). This EUA will remain  in effect (meaning this test can be used) for the duration of the COVID-19 declaration under Section 564(b)(1) of the Act, 21 U.S.C. section 360bbb-3(b)(1), unless the authorization is terminated or revoked sooner.    Influenza A by PCR NEGATIVE NEGATIVE Final   Influenza B by PCR NEGATIVE NEGATIVE Final    Comment: (NOTE) The Xpert Xpress SARS-CoV-2/FLU/RSV assay is  intended as an aid in  the diagnosis of influenza from Nasopharyngeal swab specimens and  should not be used as a sole basis for treatment. Nasal washings and  aspirates are unacceptable for Xpert Xpress SARS-CoV-2/FLU/RSV  testing. Fact Sheet for Patients: https://www.moore.com/ Fact Sheet for Healthcare Providers: https://www.young.biz/ This test is not yet approved or cleared by the [place] FDA and  has been authorized for detection and/or diagnosis of SARS-CoV-2 by  FDA under an Emergency Use Authorization (EUA). This EUA will remain  in effect (meaning this test can be used) for the duration of the  Covid-19 declaration under Section 564(b)(1) of the Act, 21  U.S.C. section 360bbb-3(b)(1), unless the authorization is  terminated or revoked. Performed at [facility], [address] [place], [place] [address]   MRSA PCR Screening     Status: None   Collection Time: 05/14/19  7:00 AM   Specimen: Nasal Mucosa; Nasopharyngeal  Result Value Ref Range Status   MRSA by PCR NEGATIVE NEGATIVE Final    Comment:        The GeneXpert MRSA Assay (FDA approved for NASAL specimens only), is one component of a comprehensive MRSA colonization surveillance program. It is not intended to diagnose MRSA infection nor to guide or monitor treatment for MRSA infections. Performed at [facility], [address] [place], [place] [address]       Radiology Studies: No results found.   Scheduled Meds: . Chlorhexidine Gluconate Cloth  6 each Topical Daily  . gabapentin  1,200 mg Oral QHS  . gabapentin  600 mg Oral 2 times per day  . insulin aspart  0-9 Units Subcutaneous TID WC  . liraglutide  1.8 mg Subcutaneous Daily  . metFORMIN  2,000 mg Oral Q breakfast  . nadolol  20 mg Oral Daily  . octreotide  50 mcg Intravenous Once  . pantoprazole  40 mg Oral BID   Continuous Infusions: . cefTRIAXone  (ROCEPHIN)  IV 2 g (05/17/19 0839)     LOS: 3 days    Time spent: 25 minutes spent in the coordination of care today.    Teddy Spike, DO [facility]  If 7PM-7AM, please contact night-coverage [email or web address] 05/17/2019, 10:37 AM

## 2019-05-18 LAB — CBC WITH DIFFERENTIAL/PLATELET
Abs Immature Granulocytes: 0.02 10*3/uL (ref 0.00–0.07)
Basophils Absolute: 0 10*3/uL (ref 0.0–0.1)
Basophils Relative: 1 %
Eosinophils Absolute: 0.3 10*3/uL (ref 0.0–0.5)
Eosinophils Relative: 4 %
HCT: 28.5 % — ABNORMAL LOW (ref 39.0–52.0)
Hemoglobin: 9.7 g/dL — ABNORMAL LOW (ref 13.0–17.0)
Immature Granulocytes: 0 %
Lymphocytes Relative: 38 %
Lymphs Abs: 2.3 10*3/uL (ref 0.7–4.0)
MCH: 34 pg (ref 26.0–34.0)
MCHC: 34 g/dL (ref 30.0–36.0)
MCV: 100 fL (ref 80.0–100.0)
Monocytes Absolute: 0.5 10*3/uL (ref 0.1–1.0)
Monocytes Relative: 9 %
Neutro Abs: 3 10*3/uL (ref 1.7–7.7)
Neutrophils Relative %: 48 %
Platelets: 127 10*3/uL — ABNORMAL LOW (ref 150–400)
RBC: 2.85 MIL/uL — ABNORMAL LOW (ref 4.22–5.81)
RDW: 14.2 % (ref 11.5–15.5)
WBC: 6.2 10*3/uL (ref 4.0–10.5)
nRBC: 0 % (ref 0.0–0.2)

## 2019-05-18 LAB — COMPREHENSIVE METABOLIC PANEL
ALT: 37 U/L (ref 0–44)
AST: 35 U/L (ref 15–41)
Albumin: 3.2 g/dL — ABNORMAL LOW (ref 3.5–5.0)
Alkaline Phosphatase: 48 U/L (ref 38–126)
Anion gap: 8 (ref 5–15)
BUN: 6 mg/dL (ref 6–20)
CO2: 25 mmol/L (ref 22–32)
Calcium: 8.1 mg/dL — ABNORMAL LOW (ref 8.9–10.3)
Chloride: 104 mmol/L (ref 98–111)
Creatinine, Ser: 0.89 mg/dL (ref 0.61–1.24)
GFR calc Af Amer: >60 mL/min (ref >60)
GFR calc non Af Amer: >60 mL/min (ref >60)
Glucose, Bld: 126 mg/dL — ABNORMAL HIGH (ref 70–99)
Potassium: 3.5 mmol/L (ref 3.5–5.1)
Sodium: 137 mmol/L (ref 135–145)
Total Bilirubin: 0.9 mg/dL (ref 0.3–1.2)
Total Protein: 5.6 g/dL — ABNORMAL LOW (ref 6.5–8.1)

## 2019-05-18 LAB — GLUCOSE, CAPILLARY
Glucose-Capillary: 131 mg/dL — ABNORMAL HIGH (ref 70–99)
Glucose-Capillary: 113 mg/dL — ABNORMAL HIGH (ref 70–99)
Glucose-Capillary: 201 mg/dL — ABNORMAL HIGH (ref 70–99)
Glucose-Capillary: 173 mg/dL — ABNORMAL HIGH (ref 70–99)
Glucose-Capillary: 149 mg/dL — ABNORMAL HIGH (ref 70–99)

## 2019-05-18 LAB — MAGNESIUM: Magnesium: 2 mg/dL (ref 1.7–2.4)

## 2019-05-18 MED ORDER — FLUTICASONE PROPIONATE 50 MCG/ACT NA SUSP
1.0000 | Freq: Every day | NASAL | Status: DC
  Administered 2019-05-18 – 2019-05-19 (×2): 1 via NASAL
  Filled 2019-05-18: qty 16

## 2019-05-18 MED ORDER — SODIUM CHLORIDE 0.9 % IV BOLUS
1000.0000 mL | Freq: Once | INTRAVENOUS | Status: AC
  Administered 2019-05-18: 1000 mL via INTRAVENOUS

## 2019-05-18 MED ORDER — NADOLOL 20 MG PO TABS
10.0000 mg | ORAL_TABLET | Freq: Every day | ORAL | Status: DC
  Administered 2019-05-19: 10 mg via ORAL
  Filled 2019-05-18: qty 1

## 2019-05-18 MED ORDER — MIDODRINE HCL 5 MG PO TABS
5.0000 mg | ORAL_TABLET | Freq: Three times a day (TID) | ORAL | Status: DC
  Administered 2019-05-18 – 2019-05-19 (×6): 5 mg via ORAL
  Filled 2019-05-18 (×6): qty 1

## 2019-05-18 NOTE — Progress Notes (Signed)
Marland Kitchen  PROGRESS NOTE    Hunter Gray  ZOX:[id number] DOB: 1970/09/13 DOA: 05/14/2019 PCP: Farris Has, MD   Brief Narrative:   Hunter Bossier Vasquezis a 49 y.o.malewith medical history significant ofliver cirrhosis, type 2 diabetes presented to the hospital with hematemesis and melena. Patient does have history of liver cirrhosis and follows up with Dr. Doretha Imus.States he had an endoscopy done 6 months ago which showed varices for which his gastroenterologist had recommended a procedure. He is not aware of any history of gastric ulcers. He had 2 Advilsone day prior to presentationfor aheadache but does not use NSAIDs on a regular basis otherwise. ED Course:Patient had a near syncopal episode at triage. He was mildly hypotensive. Hemoglobin at 13.4. FOBT positive. Transaminases mildly elevated and no significant change compared to prior labs. Totalbili 1.3, was 0.4 on prior labs. SARS-CoV-2 PCR was negative. Patient was then admitted to hospital for upper GI bleed.  5/13: His BP is on the low normal side, but he's symptomatic (lightheaded/weak). Will add midodrine today. Can we cut nadolol in half? Check orthostatics. Labs otherwise ok.    Assessment & Plan:   Principal Problem:   Upper GI bleed Active Problems:   Liver cirrhosis (HCC)   Insulin dependent type 2 diabetes mellitus (HCC)  Upper GI bleed with hematemesis melena. Near syncopal episode Mild hypotension     - status post endoscopy on 05/14/2019 with findings of esophageal varices and 4 bands were placed in.       - Had non bleeding gastric ulcer with clean base and portal hypertensive gastropathy.      - Off octreotide drip since 05/15/2019 noon.     - No further bleeding episodes.     - 5/12: Hgb is stable at 9.1     - GI has s/o'd w/ following recs:         - Continue protonix 40mg  BID at discharge         - Continue nadolol 20mg  qday at discharge         - avoid EtOH and NSAIDs     - he has  symptomatic hypotension; will bolus fluids today; continue ambulation as tolerated     - 5/13: His BP is low normal, but he's still symptomatic. Check orthostatics, add midodrine. May try cutting nadolol in half.   Cirrhosis of liver with portal hypertension     - nadolol 20mg  qday at discharge, follow up with GI     - 5/13: may need to decrease nadolol dose.  Diabetes mellitus type 2.        - Continue sliding scale insulin, metformin     - Hemoglobin A1c of 6.9.     - follow up with PCP at discharge     - 5/13: glucose is fine today  Thrombocytopenia     - no new evidence of bleen     - likely secondaryt o liver disease     - plts trending up     - monitor     - 5/13: plts improving, follow  DVT prophylaxis: SCDs Code Status: FULL Family Communication: Attempted call to wife. Received VM only.    Status is: Inpatient  Remains inpatient appropriate because:Still with symptomatic hypotension.    Dispo: The patient is from: Home              Anticipated d/c is to: Home              Anticipated d/c date  is: 1 day              Patient currently is not medically stable to d/c.  Consultants:   GI  Procedures:   EGD  ROS:  Reports weakness with walking. Remainder 10-pt ROS is negative for all not previously mentioned.  Subjective: "I'm still weak and a little lightheaded when I get up and move."  Objective: Vitals:   05/18/19 0716 05/18/19 0730 05/18/19 0800 05/18/19 1000  BP: (!) 88/40 (!) 91/43  (!) 95/48  Pulse: 64 62    Resp: 15   (!) 22  Temp:   98.9 F (37.2 C)   TempSrc:   Oral   SpO2: 95% 95%    Weight:      Height:        Intake/Output Summary (Last 24 hours) at 05/18/2019 1158 Last data filed at 05/18/2019 0867 Gross per 24 hour  Intake 3380 ml  Output 1750 ml  Net 1630 ml   Filed Weights   05/14/19 0139  Weight: 83.9 kg    Examination:  General: 49 y.o. male resting in bed in NAD Cardiovascular: RRR, +S1, S2, no m/g/r, equal pulses  throughout Respiratory: CTABL, no w/r/r, normal WOB GI: BS+, NDNT, no masses noted, no organomegaly noted MSK: No e/c/c Skin: No rashes, bruises, ulcerations noted Neuro: A&O x 3, no focal deficits Psyc: Appropriate interaction and affect, calm/cooperative   Data Reviewed: I have personally reviewed following labs and imaging studies.  CBC: Recent Labs  Lab 05/14/19 0145 05/14/19 0438 05/14/19 1221 05/15/19 0210 05/16/19 0208 05/17/19 0206 05/18/19 0204  WBC 11.2*  --   --  7.4 6.5 6.5 6.2  NEUTROABS  --   --   --   --   --   --  3.0  HGB 13.4   < > 10.6* 9.4* 9.5* 9.1* 9.7*  HCT 39.6   < > 31.5* 28.6* 27.9* 27.2* 28.5*  MCV 98.3  --   --  99.7 98.9 97.8 100.0  PLT 149*  --   --  109* 101* 113* 127*   < > = values in this interval not displayed.   Basic Metabolic Panel: Recent Labs  Lab 05/14/19 0145 05/15/19 0210 05/16/19 0208 05/17/19 0206 05/18/19 0204  NA 140 142 137 136 137  K 3.9 3.9 3.8 3.7 3.5  CL 105 110 104 102 104  CO2 24 26 26 27 25   GLUCOSE 187* 154* 120* 162* 126*  BUN 31* 14 10 7 6   CREATININE 0.83 0.90 0.77 0.91 0.89  CALCIUM 8.8* 8.0* 8.1* 8.1* 8.1*  MG  --  1.8 1.8 2.0 2.0  PHOS  --  2.9 3.9  --   --    GFR: Estimated Creatinine Clearance: 96.1 mL/min (by C-G formula based on SCr of 0.89 mg/dL). Liver Function Tests: Recent Labs  Lab 05/14/19 0145 05/15/19 0210 05/16/19 0208 05/18/19 0204  AST 54* 34 31 35  ALT 63* 42 38 37  ALKPHOS 71 41 46 48  BILITOT 1.3* 0.7 0.7 0.9  PROT 6.7 5.3* 5.6* 5.6*  ALBUMIN 3.9 3.1* 3.1* 3.2*   No results for input(s): LIPASE, AMYLASE in the last 168 hours. No results for input(s): AMMONIA in the last 168 hours. Coagulation Profile: Recent Labs  Lab 05/14/19 0213 05/15/19 0210  INR 1.3* 1.3*   Cardiac Enzymes: No results for input(s): CKTOTAL, CKMB, CKMBINDEX, TROPONINI in the last 168 hours. BNP (last 3 results) No results for input(s): PROBNP in the last 8760  hours. HbA1C: No results for  input(s): HGBA1C in the last 72 hours. CBG: Recent Labs  Lab 05/17/19 1112 05/17/19 1716 05/18/19 0626 05/18/19 0727 05/18/19 1154  GLUCAP 200* 175* 131* 113* 201*   Lipid Profile: No results for input(s): CHOL, HDL, LDLCALC, TRIG, CHOLHDL, LDLDIRECT in the last 72 hours. Thyroid Function Tests: No results for input(s): TSH, T4TOTAL, FREET4, T3FREE, THYROIDAB in the last 72 hours. Anemia Panel: No results for input(s): VITAMINB12, FOLATE, FERRITIN, TIBC, IRON, RETICCTPCT in the last 72 hours. Sepsis Labs: No results for input(s): PROCALCITON, LATICACIDVEN in the last 168 hours.  Recent Results (from the past 240 hour(s))  Respiratory Panel by RT PCR (Flu A&B, Covid) - Nasopharyngeal Swab     Status: None   Collection Time: 05/14/19  3:36 AM   Specimen: Nasopharyngeal Swab  Result Value Ref Range Status   SARS Coronavirus 2 by RT PCR NEGATIVE NEGATIVE Final    Comment: (NOTE) SARS-CoV-2 target nucleic acids are NOT DETECTED. The SARS-CoV-2 RNA is generally detectable in upper respiratoy specimens during the acute phase of infection. The lowest concentration of SARS-CoV-2 viral copies this assay can detect is 131 copies/mL. A negative result does not preclude SARS-Cov-2 infection and should not be used as the sole basis for treatment or other patient management decisions. A negative result may occur with  improper specimen collection/handling, submission of specimen other than nasopharyngeal swab, presence of viral mutation(s) within the areas targeted by this assay, and inadequate number of viral copies (<131 copies/mL). A negative result must be combined with clinical observations, patient history, and epidemiological information. The expected result is Negative. Fact Sheet for Patients:  PinkCheek.be Fact Sheet for Healthcare Providers:  GravelBags.it This test is not yet ap proved or cleared by the [place]  FDA and  has been authorized for detection and/or diagnosis of SARS-CoV-2 by FDA under an Emergency Use Authorization (EUA). This EUA will remain  in effect (meaning this test can be used) for the duration of the COVID-19 declaration under Section 564(b)(1) of the Act, 21 U.S.C. section 360bbb-3(b)(1), unless the authorization is terminated or revoked sooner.    Influenza A by PCR NEGATIVE NEGATIVE Final   Influenza B by PCR NEGATIVE NEGATIVE Final    Comment: (NOTE) The Xpert Xpress SARS-CoV-2/FLU/RSV assay is intended as an aid in  the diagnosis of influenza from Nasopharyngeal swab specimens and  should not be used as a sole basis for treatment. Nasal washings and  aspirates are unacceptable for Xpert Xpress SARS-CoV-2/FLU/RSV  testing. Fact Sheet for Patients: PinkCheek.be Fact Sheet for Healthcare Providers: GravelBags.it This test is not yet approved or cleared by the [place] FDA and  has been authorized for detection and/or diagnosis of SARS-CoV-2 by  FDA under an Emergency Use Authorization (EUA). This EUA will remain  in effect (meaning this test can be used) for the duration of the  Covid-19 declaration under Section 564(b)(1) of the Act, 21  U.S.C. section 360bbb-3(b)(1), unless the authorization is  terminated or revoked. Performed at [facility], [place] [address] [place], [place] [address]   MRSA PCR Screening     Status: None   Collection Time: 05/14/19  7:00 AM   Specimen: Nasal Mucosa; Nasopharyngeal  Result Value Ref Range Status   MRSA by PCR NEGATIVE NEGATIVE Final    Comment:        The GeneXpert MRSA Assay (FDA approved for NASAL specimens only), is one component of a comprehensive MRSA colonization surveillance program. It  is not intended to diagnose MRSA infection nor to guide or monitor treatment for MRSA infections. Performed at [facility], [address], [place], [place] [address]       Radiology Studies: No results found.   Scheduled Meds: . Chlorhexidine Gluconate Cloth  6 each Topical Daily  . fluticasone  1 spray Each Nare Daily  . gabapentin  1,200 mg Oral QHS  . gabapentin  600 mg Oral 2 times per day  . insulin aspart  0-9 Units Subcutaneous TID WC  . liraglutide  1.8 mg Subcutaneous Daily  . metFORMIN  2,000 mg Oral Q breakfast  . midodrine  5 mg Oral TID WC  . nadolol  20 mg Oral Daily  . octreotide  50 mcg Intravenous Once  . pantoprazole  40 mg Oral BID   Continuous Infusions: . cefTRIAXone (ROCEPHIN)  IV 2 g (05/18/19 1009)     LOS: 4 days    Time spent: 25 minutes spent in the coordination of care today.    Teddy Spike, DO [facility]  If 7PM-7AM, please contact night-coverage [email or web address] 05/18/2019, 11:58 AM

## 2019-05-18 NOTE — TOC Progression Note (Signed)
Transition of Care [facility]) - Progression Note    Patient Details  Name: CHRISTOFFER CURRIER MRN: [id number] Date of Birth: 01/29/1970  [facility] of [facility]) CM/SW Contact  Golda Acre, RN Phone Number: 05/18/2019, 7:57 AM  Clinical Narrative:    Iv Octreotide ongoing, continues to be hypotensive with bp of 83/31 and 91/42 and symptomatic.   Expected Discharge Plan: Home/Self Care Barriers to Discharge: Continued Medical Work up  Expected Discharge Plan and Services Expected Discharge Plan: Home/Self Care   Discharge Planning Services: CM Consult   Living arrangements for the past 2 months: Single Family Home                                       Social Determinants of Health (SDOH) Interventions    Readmission Risk Interventions No flowsheet data found.

## 2019-05-18 NOTE — Progress Notes (Signed)
PT Cancellation Note  Patient Details Name: Hunter Gray MRN: [id number] DOB: February 08, 1970   Cancelled Treatment:    Reason Eval/Treat Not Completed: Patient not medically ready;Medical issues which prohibited therapy(Patient hypotensive today with most recent BP of 87/41 mmHg in supine, and HR of 63 bpm. Patient started on pressors today (midodrine). Will follow up when pt medically ready and schedule allows.)   Wynn Maudlin, DPT Physical Therapist with [facility] [phone]  05/18/2019 3:48 PM

## 2019-05-19 ENCOUNTER — Inpatient Hospital Stay (HOSPITAL_COMMUNITY): Payer: BC Managed Care – PPO

## 2019-05-19 DIAGNOSIS — R52 Pain, unspecified: Secondary | ICD-10-CM

## 2019-05-19 LAB — CBC WITH DIFFERENTIAL/PLATELET
Abs Immature Granulocytes: 0.02 10*3/uL (ref 0.00–0.07)
Basophils Absolute: 0 10*3/uL (ref 0.0–0.1)
Basophils Relative: 0 %
Eosinophils Absolute: 0.2 10*3/uL (ref 0.0–0.5)
Eosinophils Relative: 4 %
HCT: 29.1 % — ABNORMAL LOW (ref 39.0–52.0)
Hemoglobin: 9.7 g/dL — ABNORMAL LOW (ref 13.0–17.0)
Immature Granulocytes: 0 %
Lymphocytes Relative: 30 %
Lymphs Abs: 1.4 10*3/uL (ref 0.7–4.0)
MCH: 33.1 pg (ref 26.0–34.0)
MCHC: 33.3 g/dL (ref 30.0–36.0)
MCV: 99.3 fL (ref 80.0–100.0)
Monocytes Absolute: 0.4 10*3/uL (ref 0.1–1.0)
Monocytes Relative: 9 %
Neutro Abs: 2.6 10*3/uL (ref 1.7–7.7)
Neutrophils Relative %: 57 %
Platelets: 121 10*3/uL — ABNORMAL LOW (ref 150–400)
RBC: 2.93 MIL/uL — ABNORMAL LOW (ref 4.22–5.81)
RDW: 14.6 % (ref 11.5–15.5)
WBC: 4.7 10*3/uL (ref 4.0–10.5)
nRBC: 0 % (ref 0.0–0.2)

## 2019-05-19 LAB — GLUCOSE, CAPILLARY
Glucose-Capillary: 169 mg/dL — ABNORMAL HIGH (ref 70–99)
Glucose-Capillary: 182 mg/dL — ABNORMAL HIGH (ref 70–99)
Glucose-Capillary: 106 mg/dL — ABNORMAL HIGH (ref 70–99)

## 2019-05-19 LAB — RENAL FUNCTION PANEL
Albumin: 3.2 g/dL — ABNORMAL LOW (ref 3.5–5.0)
Anion gap: 7 (ref 5–15)
BUN: 6 mg/dL (ref 6–20)
CO2: 24 mmol/L (ref 22–32)
Calcium: 8.6 mg/dL — ABNORMAL LOW (ref 8.9–10.3)
Chloride: 109 mmol/L (ref 98–111)
Creatinine, Ser: 0.79 mg/dL (ref 0.61–1.24)
GFR calc Af Amer: >60 mL/min (ref >60)
GFR calc non Af Amer: >60 mL/min (ref >60)
Glucose, Bld: 170 mg/dL — ABNORMAL HIGH (ref 70–99)
Phosphorus: 3.5 mg/dL (ref 2.5–4.6)
Potassium: 3.9 mmol/L (ref 3.5–5.1)
Sodium: 140 mmol/L (ref 135–145)

## 2019-05-19 MED ORDER — MIDODRINE HCL 5 MG PO TABS: 5.0000 mg | ORAL_TABLET | Freq: Three times a day (TID) | ORAL | 0 refills | Status: AC

## 2019-05-19 MED ORDER — PANTOPRAZOLE SODIUM 40 MG PO TBEC: 40.0000 mg | DELAYED_RELEASE_TABLET | Freq: Two times a day (BID) | ORAL | 0 refills | Status: DC

## 2019-05-19 MED ORDER — NADOLOL 20 MG PO TABS: 10.0000 mg | ORAL_TABLET | Freq: Every day | ORAL | 0 refills | Status: DC

## 2019-05-19 NOTE — Progress Notes (Signed)
Left lower extremity venous duplex completed. Refer to "CV Proc" under chart review to view preliminary results.  05/19/2019 4:21 PM Eula Fried., MHA, RVT, RDCS, RDMS

## 2019-05-19 NOTE — Discharge Summary (Signed)
. Physician Discharge Summary  ELICEO GLADU MGQ:[id number] DOB: [birth year] DOA: 05/14/2019  PCP: Farris Has, MD  Admit date: 05/14/2019 Discharge date: 05/19/2019  Admitted From: Home Disposition:  Discharged to home.   Recommendations for Outpatient Follow-up:  1. Follow up with PCP in 1-2 weeks 2. Please obtain BMP/CBC in one week 3. Follow up with GI. They will call with appt.   Discharge Condition: Stable  CODE STATUS: FULL   Brief/Interim Summary: Edman J Vasquezis a 49 y.o.malewith medical history significant ofliver cirrhosis, type 2 diabetes presented to the hospital with hematemesis and melena. Patient does have history of liver cirrhosis and follows up with Dr. Doretha Imus.States he had an endoscopy done 6 months ago which showed varices for which his gastroenterologist had recommended a procedure. He is not aware of any history of gastric ulcers. He had 2 Advilsone day prior to presentationfor aheadache but does not use NSAIDs on a regular basis otherwise. ED Course:Patient had a near syncopal episode at triage. He was mildly hypotensive. Hemoglobin at 13.4. FOBT positive. Transaminases mildly elevated and no significant change compared to prior labs. Totalbili 1.3, was 0.4 on prior labs. SARS-CoV-2 PCR was negative. Patient was then admitted to hospital for upper GI bleed.  5/13: His BP is on the low normal side, but he's symptomatic (lightheaded/weak). Will add midodrine today. Can we cut nadolol in half? Check orthostatics. Labs otherwise ok.   5/14: His BP is low normal and he is now asymptomatic on midodrine. He was able to walk halls without issue and feel much better. We have cut his nadolol dosing to 10mg . He is good for discharge. He will need to follow up with PCP and GI.   Discharge Diagnoses:  Principal Problem:   Upper GI bleed Active Problems:   Liver cirrhosis (HCC)   Insulin dependent type 2 diabetes mellitus (HCC)  Upper GI  bleed with hematemesis melena. Near syncopal episode Mild hypotension - status post endoscopy on 05/14/2019 with findings of esophageal varices and 4 bands were placed in.  - Had non bleeding gastric ulcer with clean base and portal hypertensive gastropathy.  - Off octreotide drip since 05/15/2019 noon. - No further bleeding episodes. - 5/12: Hgb is stable at 9.1 - GI has s/o'd w/ following recs: - Continue protonix 40mg  BID at discharge - Continue nadolol 20mg  qday at discharge - avoid EtOH and NSAIDs - he has symptomatic hypotension; will bolus fluids today; continue ambulation as tolerated     - 5/13: His BP is low normal, but he's still symptomatic. Check orthostatics, add midodrine. May try cutting nadolol in half.     - 5/14: going home on nadolol 10mg  and midodrine. Continue protonix at discharge.  Cirrhosis of liver with portal hypertension - nadolol 20mg  qday at discharge, follow up with GI     - 5/13: may need to decrease nadolol dose.     - 5/14: going home on nadolol 10mg  and midodrine  Diabetes mellitus type 2.   - Continue sliding scale insulin, metformin - Hemoglobin A1c of 6.9. - follow up with PCP at discharge     - 5/13: glucose is fine today     - 5/14: continue home meds at discharge  Thrombocytopenia - no new evidence of bleen - likely secondaryt o liver disease - plts trending up - monitor     - 5/14. Stable, follow up outpatient  Discharge Instructions   Allergies as of 05/19/2019   No Known Allergies  Medication List    STOP taking these medications   carbamazepine 200 MG tablet Commonly known as: TEGRETOL   Efinaconazole 10 % Soln     TAKE these medications   DULoxetine 30 MG capsule Commonly known as: Cymbalta One tablet daily for 1 week, then take one twice daily   fluticasone 50 MCG/ACT nasal spray Commonly known as: FLONASE Place 1 spray  into both nostrils daily as needed for allergies or rhinitis.   gabapentin 600 MG tablet Commonly known as: NEURONTIN Take 600-1,200 mg by mouth See admin instructions. 600mg  in am, lunch and 1200mg  at hs   midodrine 5 MG tablet Commonly known as: PROAMATINE Take 1 tablet (5 mg total) by mouth 3 (three) times daily with meals. Start taking on: May 20, 2019   nadolol 20 MG tablet Commonly known as: CORGARD Take 0.5 tablets (10 mg total) by mouth daily. What changed: how much to take   pantoprazole 40 MG tablet Commonly known as: PROTONIX Take 1 tablet (40 mg total) by mouth 2 (two) times daily.   Synjardy XR 05-998 MG Tb24 Generic drug: Empagliflozin-metFORMIN HCl ER Take 2 tablets by mouth every morning.   traMADol 50 MG tablet Commonly known as: ULTRAM Take 50 mg by mouth every 6 (six) hours as needed for moderate pain.   Victoza 18 MG/3ML Sopn Generic drug: liraglutide Inject 1.8 mg into the skin daily.      Follow-up Information    May 22, 2019, MD Follow up in 1 week(s).   Specialty: Family Medicine Contact information: [address] [place] [address] [place] [phone]        [id number], MD. Call in 1 week(s).   Specialty: Gastroenterology Contact information: [address] [place] [address] [place] [phone]          No Known Allergies  Consultations:  GI   Procedures/Studies: VAS [id number] LOWER EXTREMITY VENOUS (DVT)  Result Date: 05/19/2019  Lower Venous DVTStudy Other Indications: Hypotension. Comparison Study: No prior study Performing Technologist: [place] MHA, RDMS, RVT, RDCS  Examination Guidelines: A complete evaluation includes B-mode imaging, spectral Doppler, color Doppler, and power Doppler as needed of all accessible portions of each vessel. Bilateral testing is considered an integral part of a complete examination. Limited examinations for reoccurring indications may be performed as noted.  The reflux portion of the exam is performed with the patient in reverse Trendelenburg.  +-----+---------------+---------+-----------+----------+--------------+ RIGHTCompressibilityPhasicitySpontaneityPropertiesThrombus Aging +-----+---------------+---------+-----------+----------+--------------+ CFV  Full           Yes      Yes                                 +-----+---------------+---------+-----------+----------+--------------+   +---------+---------------+---------+-----------+----------+--------------+ LEFT     CompressibilityPhasicitySpontaneityPropertiesThrombus Aging +---------+---------------+---------+-----------+----------+--------------+ CFV      Full           Yes      Yes                                 +---------+---------------+---------+-----------+----------+--------------+ SFJ      Full                                                        +---------+---------------+---------+-----------+----------+--------------+  FV Prox  Full                                                        +---------+---------------+---------+-----------+----------+--------------+ FV Mid   Full                                                        +---------+---------------+---------+-----------+----------+--------------+ FV DistalFull                                                        +---------+---------------+---------+-----------+----------+--------------+ PFV      Full                                                        +---------+---------------+---------+-----------+----------+--------------+ POP      Full           Yes      Yes                                 +---------+---------------+---------+-----------+----------+--------------+ PTV      Full                                                        +---------+---------------+---------+-----------+----------+--------------+ PERO     Full                                                         +---------+---------------+---------+-----------+----------+--------------+     Summary: RIGHT: - No evidence of common femoral vein obstruction.  LEFT: - There is no evidence of deep vein thrombosis in the lower extremity.  - No cystic structure found in the popliteal fossa.  *See table(s) above for measurements and observations.    Preliminary     Subjective: "I feel much better."  Discharge Exam: Vitals:   05/19/19 1500 05/19/19 1600  BP:  (!) 94/54  Pulse: 69 65  Resp: 18 (!) 21  Temp:  98.3 F (36.8 C)  SpO2: 100% 98%   Vitals:   05/19/19 1300 05/19/19 1400 05/19/19 1500 05/19/19 1600  BP:  (!) 92/46  (!) 94/54  Pulse: 66 65 69 65  Resp: 17 (!) 24 18 (!) 21  Temp:    98.3 F (36.8 C)  TempSrc:    Oral  SpO2: 98% 99% 100% 98%  Weight:      Height:        General: 49 y.o. male resting in bed in NAD  Cardiovascular: RRR, +S1, S2, no m/g/r, equal pulses throughout Respiratory: CTABL, no w/r/r, normal WOB GI: BS+, NDNT, no masses noted, no organomegaly noted MSK: No e/c/c Neuro: A&O x 3, no focal deficits Psyc: Appropriate interaction and affect, calm/cooperative   The results of significant diagnostics from this hospitalization (including imaging, microbiology, ancillary and laboratory) are listed below for reference.     Microbiology: Recent Results (from the past 240 hour(s))  Respiratory Panel by RT PCR (Flu A&B, Covid) - Nasopharyngeal Swab     Status: None   Collection Time: 05/14/19  3:36 AM   Specimen: Nasopharyngeal Swab  Result Value Ref Range Status   SARS Coronavirus 2 by RT PCR NEGATIVE NEGATIVE Final    Comment: (NOTE) SARS-CoV-2 target nucleic acids are NOT DETECTED. The SARS-CoV-2 RNA is generally detectable in upper respiratoy specimens during the acute phase of infection. The lowest concentration of SARS-CoV-2 viral copies this assay can detect is 131 copies/mL. A negative result does not preclude SARS-Cov-2 infection and  should not be used as the sole basis for treatment or other patient management decisions. A negative result may occur with  improper specimen collection/handling, submission of specimen other than nasopharyngeal swab, presence of viral mutation(s) within the areas targeted by this assay, and inadequate number of viral copies (<131 copies/mL). A negative result must be combined with clinical observations, patient history, and epidemiological information. The expected result is Negative. Fact Sheet for Patients:  PinkCheek.be Fact Sheet for Healthcare Providers:  GravelBags.it This test is not yet ap proved or cleared by the [place] FDA and  has been authorized for detection and/or diagnosis of SARS-CoV-2 by FDA under an Emergency Use Authorization (EUA). This EUA will remain  in effect (meaning this test can be used) for the duration of the COVID-19 declaration under Section 564(b)(1) of the Act, 21 U.S.C. section 360bbb-3(b)(1), unless the authorization is terminated or revoked sooner.    Influenza A by PCR NEGATIVE NEGATIVE Final   Influenza B by PCR NEGATIVE NEGATIVE Final    Comment: (NOTE) The Xpert Xpress SARS-CoV-2/FLU/RSV assay is intended as an aid in  the diagnosis of influenza from Nasopharyngeal swab specimens and  should not be used as a sole basis for treatment. Nasal washings and  aspirates are unacceptable for Xpert Xpress SARS-CoV-2/FLU/RSV  testing. Fact Sheet for Patients: PinkCheek.be Fact Sheet for Healthcare Providers: GravelBags.it This test is not yet approved or cleared by the [place] FDA and  has been authorized for detection and/or diagnosis of SARS-CoV-2 by  FDA under an Emergency Use Authorization (EUA). This EUA will remain  in effect (meaning this test can be used) for the duration of the  Covid-19 declaration under Section  564(b)(1) of the Act, 21  U.S.C. section 360bbb-3(b)(1), unless the authorization is  terminated or revoked. Performed at [facility], [place] [address] [place], [place] [address]   MRSA PCR Screening     Status: None   Collection Time: 05/14/19  7:00 AM   Specimen: Nasal Mucosa; Nasopharyngeal  Result Value Ref Range Status   MRSA by PCR NEGATIVE NEGATIVE Final    Comment:        The GeneXpert MRSA Assay (FDA approved for NASAL specimens only), is one component of a comprehensive MRSA colonization surveillance program. It is not intended to diagnose MRSA infection nor to guide or monitor treatment for MRSA infections. Performed at [facility], [place] [address], [place], [place] [address]      Labs:  BNP (last 3 results) No results for input(s): BNP in the last 8760 hours. Basic Metabolic Panel: Recent Labs  Lab 05/15/19 0210 05/16/19 0208 05/17/19 0206 05/18/19 0204 05/19/19 0909  NA 142 137 136 137 140  K 3.9 3.8 3.7 3.5 3.9  CL 110 104 102 104 109  CO2 26 26 27 25 24   GLUCOSE 154* 120* 162* 126* 170*  BUN 14 10 7 6 6   CREATININE 0.90 0.77 0.91 0.89 0.79  CALCIUM 8.0* 8.1* 8.1* 8.1* 8.6*  MG 1.8 1.8 2.0 2.0  --   PHOS 2.9 3.9  --   --  3.5   Liver Function Tests: Recent Labs  Lab 05/14/19 0145 05/15/19 0210 05/16/19 0208 05/18/19 0204 05/19/19 0909  AST 54* 34 31 35  --   ALT 63* 42 38 37  --   ALKPHOS 71 41 46 48  --   BILITOT 1.3* 0.7 0.7 0.9  --   PROT 6.7 5.3* 5.6* 5.6*  --   ALBUMIN 3.9 3.1* 3.1* 3.2* 3.2*   No results for input(s): LIPASE, AMYLASE in the last 168 hours. No results for input(s): AMMONIA in the last 168 hours. CBC: Recent Labs  Lab 05/15/19 0210 05/16/19 0208 05/17/19 0206 05/18/19 0204 05/19/19 0909  WBC 7.4 6.5 6.5 6.2 4.7  NEUTROABS  --   --   --  3.0 2.6  HGB 9.4* 9.5* 9.1* 9.7* 9.7*  HCT 28.6* 27.9* 27.2* 28.5* 29.1*  MCV 99.7 98.9 97.8 100.0 99.3  PLT 109* 101* 113* 127* 121*    Cardiac Enzymes: No results for input(s): CKTOTAL, CKMB, CKMBINDEX, TROPONINI in the last 168 hours. BNP: Invalid input(s): POCBNP CBG: Recent Labs  Lab 05/18/19 1530 05/18/19 1751 05/19/19 0829 05/19/19 1140 05/19/19 1613  GLUCAP 173* 149* 169* 182* 106*   D-Dimer No results for input(s): DDIMER in the last 72 hours. Hgb A1c No results for input(s): HGBA1C in the last 72 hours. Lipid Profile No results for input(s): CHOL, HDL, LDLCALC, TRIG, CHOLHDL, LDLDIRECT in the last 72 hours. Thyroid function studies No results for input(s): TSH, T4TOTAL, T3FREE, THYROIDAB in the last 72 hours.  Invalid input(s): FREET3 Anemia work up No results for input(s): VITAMINB12, FOLATE, FERRITIN, TIBC, IRON, RETICCTPCT in the last 72 hours. Urinalysis No results found for: COLORURINE, APPEARANCEUR, LABSPEC, PHURINE, GLUCOSEU, HGBUR, BILIRUBINUR, KETONESUR, PROTEINUR, UROBILINOGEN, NITRITE, LEUKOCYTESUR Sepsis Labs Invalid input(s): PROCALCITONIN,  WBC,  LACTICIDVEN Microbiology Recent Results (from the past 240 hour(s))  Respiratory Panel by RT PCR (Flu A&B, Covid) - Nasopharyngeal Swab     Status: None   Collection Time: 05/14/19  3:36 AM   Specimen: Nasopharyngeal Swab  Result Value Ref Range Status   SARS Coronavirus 2 by RT PCR NEGATIVE NEGATIVE Final    Comment: (NOTE) SARS-CoV-2 target nucleic acids are NOT DETECTED. The SARS-CoV-2 RNA is generally detectable in upper respiratoy specimens during the acute phase of infection. The lowest concentration of SARS-CoV-2 viral copies this assay can detect is 131 copies/mL. A negative result does not preclude SARS-Cov-2 infection and should not be used as the sole basis for treatment or other patient management decisions. A negative result may occur with  improper specimen collection/handling, submission of specimen other than nasopharyngeal swab, presence of viral mutation(s) within the areas targeted by this assay, and inadequate  number of viral copies (<131 copies/mL). A negative result must be combined with clinical observations, patient history, and epidemiological information. The expected result is Negative. Fact Sheet for Patients:  05/21/19 Fact Sheet  for Healthcare Providers:  https://www.young.biz/https://www.fda.gov/media/142435/download This test is not yet ap proved or cleared by the [place] FDA and  has been authorized for detection and/or diagnosis of SARS-CoV-2 by FDA under an Emergency Use Authorization (EUA). This EUA will remain  in effect (meaning this test can be used) for the duration of the COVID-19 declaration under Section 564(b)(1) of the Act, 21 U.S.C. section 360bbb-3(b)(1), unless the authorization is terminated or revoked sooner.    Influenza A by PCR NEGATIVE NEGATIVE Final   Influenza B by PCR NEGATIVE NEGATIVE Final    Comment: (NOTE) The Xpert Xpress SARS-CoV-2/FLU/RSV assay is intended as an aid in  the diagnosis of influenza from Nasopharyngeal swab specimens and  should not be used as a sole basis for treatment. Nasal washings and  aspirates are unacceptable for Xpert Xpress SARS-CoV-2/FLU/RSV  testing. Fact Sheet for Patients: https://www.moore.com/https://www.fda.gov/media/142436/download Fact Sheet for Healthcare Providers: https://www.young.biz/https://www.fda.gov/media/142435/download This test is not yet approved or cleared by the [place] FDA and  has been authorized for detection and/or diagnosis of SARS-CoV-2 by  FDA under an Emergency Use Authorization (EUA). This EUA will remain  in effect (meaning this test can be used) for the duration of the  Covid-19 declaration under Section 564(b)(1) of the Act, 21  U.S.C. section 360bbb-3(b)(1), unless the authorization is  terminated or revoked. Performed at [facility], [address] [place], [place] [address]   MRSA PCR Screening     Status: None   Collection Time: 05/14/19  7:00 AM   Specimen: Nasal Mucosa;  Nasopharyngeal  Result Value Ref Range Status   MRSA by PCR NEGATIVE NEGATIVE Final    Comment:        The GeneXpert MRSA Assay (FDA approved for NASAL specimens only), is one component of a comprehensive MRSA colonization surveillance program. It is not intended to diagnose MRSA infection nor to guide or monitor treatment for MRSA infections. Performed at [facility], [address] [place], [place] [address]      Time coordinating discharge: Over 35 minutes  SIGNED:   Teddy Spikeyrone A Dene Nazir, DO  [facility] 05/19/2019, 5:58 PM   If 7PM-7AM, please contact night-coverage [email or web address]

## 2019-05-19 NOTE — Progress Notes (Signed)
Pt. Stated that at times his left leg is warmer behind it than his right. He is also complaining of some lower left leg pain that he says could just be from his neuropathy. RN messaged MD and is awaiting response.

## 2019-05-19 NOTE — Evaluation (Signed)
Physical Therapy Evaluation Patient Details Name: Hunter Gray MRN: [id number] DOB: 10/26/1970 Today's Date: 05/19/2019   History of Present Illness  Pt admitted with GIB and with hx of DM and Cirrhosis of the liver  Clinical Impression  Pt presenting with mild ambulatory instability but with rapid improvement with increased distance walked.  Pt progressed to back stepping, side-stepping, and bil stork standing unassisted.  Pt will be dc to nursing to continue mobilization.  Of note, pt with several episodes of HR moving into 140s while up moving but then falling back below 100 - RN aware.    Follow Up Recommendations No PT follow up    Equipment Recommendations  None recommended by PT    Recommendations for Other Services       Precautions / Restrictions Precautions Precautions: Fall Precaution Comments: Episodes of HR elevating into 140s Restrictions Weight Bearing Restrictions: No      Mobility  Bed Mobility Overal bed mobility: Modified Independent             General bed mobility comments: no physical assist  Transfers Overall transfer level: Modified independent               General transfer comment: no physical assist   Ambulation/Gait Ambulation/Gait assistance: Min guard;Supervision;Independent Gait Distance (Feet): 900 Feet Assistive device: None Gait Pattern/deviations: Step-through pattern;Decreased step length - right;Decreased step length - left;Shuffle Gait velocity: mod pace   General Gait Details: mild initial instability but with rapid improvement with increased distance ambulated  Stairs            Wheelchair Mobility    Modified Rankin (Stroke Patients Only)       Balance Overall balance assessment: Mild deficits observed, not formally tested                                           Pertinent Vitals/Pain Pain Assessment: No/denies pain    Home Living Family/patient expects to be discharged  to:: Private residence Living Arrangements: Spouse/significant other Available Help at Discharge: Family Type of Home: House Home Access: Stairs to enter Entrance Stairs-Rails: None Entrance Stairs-Number of Steps: 2 Home Layout: Able to live on main level with bedroom/bathroom Home Equipment: None      Prior Function Level of Independence: Independent               Hand Dominance        Extremity/Trunk Assessment   Upper Extremity Assessment Upper Extremity Assessment: Overall WFL for tasks assessed    Lower Extremity Assessment Lower Extremity Assessment: Generalized weakness       Communication   Communication: No difficulties  Cognition Arousal/Alertness: Awake/alert Behavior During Therapy: WFL for tasks assessed/performed Overall Cognitive Status: Within Functional Limits for tasks assessed                                        General Comments      Exercises     Assessment/Plan    PT Assessment Patient needs continued PT services  PT Problem List Decreased strength;Decreased activity tolerance;Decreased balance;Decreased mobility       PT Treatment Interventions Gait training;Functional mobility training;Balance training;Patient/family education    PT Goals (Current goals can be found in the Care Plan section)  Acute Rehab PT Goals Patient  Stated Goal: Regain IND PT Goal Formulation: All assessment and education complete, DC therapy    Frequency Min 1X/week   Barriers to discharge        Co-evaluation               AM-PAC PT "6 Clicks" Mobility  Outcome Measure Help needed turning from your back to your side while in a flat bed without using bedrails?: None Help needed moving from lying on your back to sitting on the side of a flat bed without using bedrails?: None Help needed moving to and from a bed to a chair (including a wheelchair)?: None Help needed standing up from a chair using your arms (e.g.,  wheelchair or bedside chair)?: None Help needed to walk in hospital room?: A Little Help needed climbing 3-5 steps with a railing? : A Little 6 Click Score: 22    End of Session Equipment Utilized During Treatment: Gait belt Activity Tolerance: Patient tolerated treatment well Patient left: in chair;with call bell/phone within reach;with chair alarm set;with family/visitor present Nurse Communication: Mobility status PT Visit Diagnosis: Difficulty in walking, not elsewhere classified (R26.2)    Time: 1340-1402 PT Time Calculation (min) (ACUTE ONLY): 22 min   Charges:   PT Evaluation $PT Eval Low Complexity: 1 Low          Hunter Gray PT Acute Rehabilitation Services Pager [phone] Office [phone]   Hunter Gray 05/19/2019, 2:17 PM

## 2019-05-19 NOTE — Discharge Instructions (Signed)
Hypotension As your heart beats, it forces blood through your body. Hypotension, commonly called low blood pressure, is when the force of blood pumping through your arteries is too weak. Arteries are blood vessels that carry blood from the heart throughout the body. Depending on the cause and severity, hypotension may be harmless (benign) or may cause serious problems (be critical). When blood pressure is too low, you may not get enough blood to your brain or to the rest of your organs. This can cause weakness, light-headedness, rapid heartbeat, and fainting. What are the causes? This condition may be caused by:  Blood loss.  Loss of body fluids (dehydration).  Heart problems.  Hormone (endocrine) problems.  Pregnancy.  Severe infection.  Lack of certain nutrients.  Severe allergic reactions (anaphylaxis).  Certain medicines, such as blood pressure medicine or medicines that make the body lose excess fluids (diuretics). Sometimes, hypotension may be caused by not taking medicine as directed, such as taking too much of a certain medicine. What increases the risk? The following factors may make you more likely to develop this condition:  Age. Risk increases as you get older.  Conditions that affect the heart or the central nervous system.  Taking certain medicines, such as blood pressure medicine or diuretics.  Being pregnant. What are the signs or symptoms? Common symptoms of this condition include:  Weakness.  Light-headedness.  Dizziness.  Blurred vision.  Fatigue.  Rapid heartbeat.  Fainting, in severe cases. How is this diagnosed? This condition is diagnosed based on:  Your medical history.  Your symptoms.  Your blood pressure measurement. Your health care provider will check your blood pressure when you are: ? Lying down. ? Sitting. ? Standing. A blood pressure reading is recorded as two numbers, such as "120 over 80" (or 120/80). The first ("top")  number is called the systolic pressure. It is a measure of the pressure in your arteries as your heart beats. The second ("bottom") number is called the diastolic pressure. It is a measure of the pressure in your arteries when your heart relaxes between beats. Blood pressure is measured in a unit called mm Hg. Healthy blood pressure for most adults is 120/80. If your blood pressure is below 90/60, you may be diagnosed with hypotension. Other information or tests that may be used to diagnose hypotension include:  Your other vital signs, such as your heart rate and temperature.  Blood tests.  Tilt table test. For this test, you will be safely secured to a table that moves you from a lying position to an upright position. Your heart rhythm and blood pressure will be monitored during the test. How is this treated? Treatment for this condition may include:  Changing your diet. This may involve eating more salt (sodium) or drinking more water.  Taking medicines to raise your blood pressure.  Changing the dosage of certain medicines you are taking that might be lowering your blood pressure.  Wearing compression stockings. These stockings help to prevent blood clots and reduce swelling in your legs. In some cases, you may need to go to the hospital for:  Fluid replacement. This means you will receive fluids through an IV.  Blood replacement. This means you will receive donated blood through an IV (transfusion).  Treating an infection or heart problems, if this applies.  Monitoring. You may need to be monitored while medicines that you are taking wear off. Follow these instructions at home: Eating and drinking   Drink enough fluid to keep your   urine pale yellow.  Eat a healthy diet, and follow instructions from your health care provider about eating or drinking restrictions. A healthy diet includes: ? Fresh fruits and vegetables. ? Whole grains. ? Lean meats. ? Low-fat dairy  products.  Eat extra salt only as directed. Do not add extra salt to your diet unless your health care provider told you to do that.  Eat frequent, small meals.  Avoid standing up suddenly after eating. Medicines  Take over-the-counter and prescription medicines only as told by your health care provider. ? Follow instructions from your health care provider about changing the dosage of your current medicines, if this applies. ? Do not stop or adjust any of your medicines on your own. General instructions   Wear compression stockings as told by your health care provider.  Get up slowly from lying down or sitting positions. This gives your blood pressure a chance to adjust.  Avoid hot showers and excessive heat as directed by your health care provider.  Return to your normal activities as told by your health care provider. Ask your health care provider what activities are safe for you.  Do not use any products that contain nicotine or tobacco, such as cigarettes, e-cigarettes, and chewing tobacco. If you need help quitting, ask your health care provider.  Keep all follow-up visits as told by your health care provider. This is important. Contact a health care provider if you:  Vomit.  Have diarrhea.  Have a fever for more than 2-3 days.  Feel more thirsty than usual.  Feel weak and tired. Get help right away if you:  Have chest pain.  Have a fast or irregular heartbeat.  Develop numbness in any part of your body.  Cannot move your arms or your legs.  Have trouble speaking.  Become sweaty or feel light-headed.  Faint.  Feel short of breath.  Have trouble staying awake.  Feel confused. Summary  Hypotension is when the force of blood pumping through your arteries is too weak.  Hypotension may be harmless (benign) or may cause serious problems (be critical).  Treatment for this condition may include changing your diet, changing your medicines, and wearing  compression stockings.  In some cases, you may need to go to the hospital for fluid or blood replacement. This information is not intended to replace advice given to you by your health care provider. Make sure you discuss any questions you have with your health care provider. Document Revised: 06/17/2017 Document Reviewed: 06/17/2017 Elsevier Patient Education  2020 [employer]  

## 2019-08-22 ENCOUNTER — Other Ambulatory Visit: Payer: Self-pay

## 2019-08-22 ENCOUNTER — Other Ambulatory Visit: Payer: Self-pay | Admitting: Gastroenterology

## 2019-08-22 ENCOUNTER — Emergency Department (HOSPITAL_COMMUNITY)
Admission: EM | Admit: 2019-08-22 | Discharge: 2019-08-22 | Disposition: A | Discharge status: Home / Self Care | Payer: BC Managed Care – PPO | Attending: Emergency Medicine | Admitting: Emergency Medicine

## 2019-08-22 ENCOUNTER — Encounter (HOSPITAL_COMMUNITY): Payer: Self-pay

## 2019-08-22 DIAGNOSIS — K920 Hematemesis: Secondary | ICD-10-CM | POA: Insufficient documentation

## 2019-08-22 DIAGNOSIS — Z5321 Procedure and treatment not carried out due to patient leaving prior to being seen by health care provider: Secondary | ICD-10-CM | POA: Insufficient documentation

## 2019-08-22 DIAGNOSIS — I8501 Esophageal varices with bleeding: Secondary | ICD-10-CM | POA: Insufficient documentation

## 2019-08-22 HISTORY — DX: Esophageal varices without bleeding: I85.00

## 2019-08-22 HISTORY — DX: Unspecified cirrhosis of liver: K74.60

## 2019-08-22 NOTE — ED Triage Notes (Addendum)
Pt sts vomiting blood once this evening after taking his vitamin C. Hx esophageal varices. Sts he was admitted here for same. No notes seen.

## 2019-08-22 NOTE — ED Notes (Signed)
Pt eloped from waiting area. Called 3X.  

## 2019-08-23 ENCOUNTER — Encounter (HOSPITAL_COMMUNITY): Payer: Self-pay | Admitting: Gastroenterology

## 2019-08-23 ENCOUNTER — Ambulatory Visit (HOSPITAL_COMMUNITY): Payer: BC Managed Care – PPO | Admitting: Certified Registered"

## 2019-08-23 ENCOUNTER — Other Ambulatory Visit: Payer: Self-pay

## 2019-08-23 ENCOUNTER — Ambulatory Visit (HOSPITAL_COMMUNITY)
Admission: RE | Admit: 2019-08-23 | Discharge: 2019-08-23 | Disposition: A | Discharge status: Home / Self Care | Payer: BC Managed Care – PPO | Attending: Gastroenterology | Admitting: Gastroenterology

## 2019-08-23 ENCOUNTER — Encounter (HOSPITAL_COMMUNITY)
Admission: RE | Disposition: A | Discharge status: Home / Self Care | Payer: Self-pay | Source: Home / Self Care | Attending: Gastroenterology

## 2019-08-23 DIAGNOSIS — K3189 Other diseases of stomach and duodenum: Secondary | ICD-10-CM | POA: Insufficient documentation

## 2019-08-23 DIAGNOSIS — K219 Gastro-esophageal reflux disease without esophagitis: Secondary | ICD-10-CM | POA: Diagnosis not present

## 2019-08-23 DIAGNOSIS — E119 Type 2 diabetes mellitus without complications: Secondary | ICD-10-CM | POA: Diagnosis not present

## 2019-08-23 DIAGNOSIS — Z79899 Other long term (current) drug therapy: Secondary | ICD-10-CM | POA: Diagnosis not present

## 2019-08-23 DIAGNOSIS — K7460 Unspecified cirrhosis of liver: Secondary | ICD-10-CM | POA: Insufficient documentation

## 2019-08-23 DIAGNOSIS — I8510 Secondary esophageal varices without bleeding: Secondary | ICD-10-CM | POA: Insufficient documentation

## 2019-08-23 DIAGNOSIS — Z794 Long term (current) use of insulin: Secondary | ICD-10-CM | POA: Insufficient documentation

## 2019-08-23 DIAGNOSIS — Z87891 Personal history of nicotine dependence: Secondary | ICD-10-CM | POA: Insufficient documentation

## 2019-08-23 DIAGNOSIS — K920 Hematemesis: Secondary | ICD-10-CM | POA: Diagnosis not present

## 2019-08-23 DIAGNOSIS — K766 Portal hypertension: Secondary | ICD-10-CM | POA: Insufficient documentation

## 2019-08-23 HISTORY — PX: ESOPHAGOGASTRODUODENOSCOPY (EGD) WITH PROPOFOL: SHX5813

## 2019-08-23 HISTORY — PX: ESOPHAGEAL BANDING: SHX5518

## 2019-08-23 LAB — GLUCOSE, CAPILLARY: Glucose-Capillary: 98 mg/dL (ref 70–99)

## 2019-08-23 SURGERY — ESOPHAGOGASTRODUODENOSCOPY (EGD) WITH PROPOFOL
Anesthesia: Monitor Anesthesia Care

## 2019-08-23 MED ORDER — PROPOFOL 500 MG/50ML IV EMUL
INTRAVENOUS | Status: DC | PRN
  Administered 2019-08-23 (×3): 30 mg via INTRAVENOUS

## 2019-08-23 MED ORDER — PROPOFOL 10 MG/ML IV BOLUS
INTRAVENOUS | Status: DC | PRN
  Administered 2019-08-23: 125 ug/kg/min via INTRAVENOUS

## 2019-08-23 MED ORDER — LIDOCAINE 2% (20 MG/ML) 5 ML SYRINGE
INTRAMUSCULAR | Status: AC
  Filled 2019-08-23: qty 5

## 2019-08-23 MED ORDER — LACTATED RINGERS IV SOLN: INTRAVENOUS | Status: DC

## 2019-08-23 MED ORDER — PROPOFOL 500 MG/50ML IV EMUL
INTRAVENOUS | Status: AC
  Filled 2019-08-23: qty 50

## 2019-08-23 MED ORDER — SODIUM CHLORIDE 0.9 % IV SOLN: INTRAVENOUS | Status: DC

## 2019-08-23 SURGICAL SUPPLY — 14 items

## 2019-08-23 NOTE — Transfer of Care (Signed)
Immediate Anesthesia Transfer of Care Note  Patient: Hunter Gray  Procedure(s) Performed: ESOPHAGOGASTRODUODENOSCOPY (EGD) WITH PROPOFOL (N/A ) ESOPHAGEAL BANDING  Patient Location: PACU  Anesthesia Type:MAC  Level of Consciousness: awake, alert , oriented and patient cooperative  Airway & Oxygen Therapy: Patient Spontanous Breathing and Patient connected to face mask  Post-op Assessment: Report given to RN and Post -op Vital signs reviewed and stable  Post vital signs: Reviewed and stable  Last Vitals:  Vitals Value Taken Time  BP 119/67 08/23/19 1055  Temp    Pulse 79 08/23/19 1057  Resp 13 08/23/19 1057  SpO2 96 % 08/23/19 1057  Vitals shown include unvalidated device data.  Last Pain:  Vitals:   08/23/19 0930  TempSrc: Oral  PainSc: 0-No pain         Complications: No complications documented.

## 2019-08-23 NOTE — Anesthesia Preprocedure Evaluation (Addendum)
Anesthesia Evaluation  Patient identified by MRN, date of birth, ID band Patient awake    Reviewed: Allergy & Precautions, NPO status , Patient's Chart, lab work & pertinent test results  Airway Mallampati: III  TM Distance: >3 FB Neck ROM: Full    Dental  (+) Chipped,    Pulmonary neg pulmonary ROS, former smoker,    Pulmonary exam normal breath sounds clear to auscultation       Cardiovascular negative cardio ROS Normal cardiovascular exam Rhythm:Regular Rate:Normal     Neuro/Psych negative neurological ROS  negative psych ROS   GI/Hepatic GERD  Medicated and Controlled,(+) Cirrhosis   Esophageal Varices    ,   Endo/Other  negative endocrine ROSdiabetes, Insulin Dependent  Renal/GU negative Renal ROS  negative genitourinary   Musculoskeletal negative musculoskeletal ROS (+)   Abdominal   Peds  Hematology  (+) Blood dyscrasia (Hgb 9.7), anemia ,   Anesthesia Other Findings EGD for GIB  Reproductive/Obstetrics                           Anesthesia Physical Anesthesia Plan  ASA: III  Anesthesia Plan: MAC   Post-op Pain Management:    Induction: Intravenous  PONV Risk Score and Plan: Propofol infusion and Treatment may vary due to age or medical condition  Airway Management Planned: Natural Airway  Additional Equipment:   Intra-op Plan:   Post-operative Plan:   Informed Consent: I have reviewed the patients History and Physical, chart, labs and discussed the procedure including the risks, benefits and alternatives for the proposed anesthesia with the patient or authorized representative who has indicated his/her understanding and acceptance.     Dental advisory given  Plan Discussed with: CRNA  Anesthesia Plan Comments:         Anesthesia Quick Evaluation

## 2019-08-23 NOTE — Discharge Instructions (Signed)

## 2019-08-23 NOTE — Brief Op Note (Signed)
08/23/2019  10:48 AM  PATIENT:  Hunter Gray  49 y.o. male  PRE-OPERATIVE DIAGNOSIS:  GI Bleed  POST-OPERATIVE DIAGNOSIS:  * No post-op diagnosis entered *  PROCEDURE:  Procedure(s): ESOPHAGOGASTRODUODENOSCOPY (EGD) WITH PROPOFOL (N/A) ESOPHAGEAL BANDING  SURGEON:  Surgeon(s) and Role:    Ronnette Juniper, MD - Primary  PHYSICIAN ASSISTANT:   ASSISTANTS: Lilli Few, Tyna Jaksch, Tech   ANESTHESIA:   MAC  EBL:  Minimal  BLOOD ADMINISTERED:none  DRAINS: none   LOCAL MEDICATIONS USED:  NONE  SPECIMEN:  No Specimen  DISPOSITION OF SPECIMEN:  N/A  COUNTS:  YES  TOURNIQUET:  * No tourniquets in log *  DICTATION: .Dragon Dictation  PLAN OF CARE: Discharge to home after PACU  PATIENT DISPOSITION:  PACU - hemodynamically stable.   Delay start of Pharmacological VTE agent (>24hrs) due to surgical blood loss or risk of bleeding: not applicable

## 2019-08-23 NOTE — H&P (Signed)
Hunter Gray is an 49 y.o. male.   Chief Complaint: Vomiting blood HPI: Patient with known cirrhosis, last EGD performed in 5/21 with banding of esophageal varices on nadolol as an outpatient. 2 days ago patient had 3 episodes of hematemesis, first 2 episodes contained small amount of black tablespoon of bright red blood but the third episode contained about a cupful of bright red blood. This was associated with upper abdominal and epigastric discomfort. Patient denies having blood in stool or black stools.  Past Medical History:  Diagnosis Date  . Cirrhosis of liver (HCC)   . Diabetes mellitus without complication (HCC)   . Vision abnormalities     Past Surgical History:  Procedure Laterality Date  . ESOPHAGEAL BANDING  05/14/2019   Procedure: ESOPHAGEAL BANDING;  Surgeon: Kerin Salen, MD;  Location: [facility];  Service: Gastroenterology;;  . ESOPHAGOGASTRODUODENOSCOPY (EGD) WITH PROPOFOL N/A 05/14/2019   Procedure: ESOPHAGOGASTRODUODENOSCOPY (EGD) WITH PROPOFOL;  Surgeon: Kerin Salen, MD;  Location: [facility];  Service: Gastroenterology;  Laterality: N/A;  . SHOULDER ARTHROSCOPY Left age 20    Family History  Problem Relation Age of Onset  . Diabetes type II Mother   . Heart disease Mother   . Diabetes type II Father   . Diabetes type II Sister    Social History:  reports that he has quit smoking. He has never used smokeless tobacco. He reports previous alcohol use. He reports previous drug use.  Allergies:  Allergies  Allergen Reactions  . Glimepiride Other (See Comments)    Gas and weight gain    Medications Prior to Admission  Medication Sig Dispense Refill  . fluticasone (FLONASE) 50 MCG/ACT nasal spray Place 1 spray into both nostrils in the morning and at bedtime.     . gabapentin (NEURONTIN) 600 MG tablet Take 600-1,200 mg by mouth See admin instructions. Take 600mg  in am, lunch and 1200mg  at hs    . nadolol (CORGARD) 20 MG tablet Take 0.5 tablets (10 mg  total) by mouth daily. 15 tablet 0  . pantoprazole (PROTONIX) 40 MG tablet Take 1 tablet (40 mg total) by mouth 2 (two) times daily. (Patient taking differently: Take 40 mg by mouth daily. ) 60 tablet 0  . SYNJARDY XR 05-998 MG TB24 Take 2 tablets by mouth every morning.    . traMADol (ULTRAM) 50 MG tablet Take 50 mg by mouth every 6 (six) hours as needed for moderate pain.     VICTOZA 18 MG/3ML SOPN Inject 1.8 mg into the skin daily.   11  . atorvastatin (LIPITOR) 10 MG tablet Take 10 mg by mouth daily.    . DULoxetine (CYMBALTA) 30 MG capsule One tablet daily for 1 week, then take one twice daily (Patient not taking: Reported on 05/14/2019) 60 capsule 3  . pioglitazone (ACTOS) 15 MG tablet Take 15 mg by mouth daily.      Results for orders placed or performed during the hospital encounter of 08/23/19 (from the past 48 hour(s))  Glucose, capillary     Status: None   Collection Time: 08/23/19  9:45 AM  Result Value Ref Range   Glucose-Capillary 98 70 - 99 mg/dL    Comment: Glucose reference range applies only to samples taken after fasting for at least 8 hours.   No results found.  Review of Systems  Constitutional: Negative.   HENT: Negative.   Eyes: Negative.   Respiratory: Negative.   Cardiovascular: Negative.   Gastrointestinal: Positive for abdominal pain and vomiting.  Endocrine: Negative.   Genitourinary: Negative.   Musculoskeletal: Negative.   Neurological: Negative.     Blood pressure 126/70, pulse 71, temperature 97.8 F (36.6 C), temperature source Oral, resp. rate 17, height 5\' 3"  (1.6 m), weight 82.1 kg, SpO2 96 %. Physical Exam Constitutional:      Appearance: He is obese.  HENT:     Head: Normocephalic and atraumatic.  Eyes:     Conjunctiva/sclera: Conjunctivae normal.  Cardiovascular:     Rate and Rhythm: Normal rate and regular rhythm.  Pulmonary:     Effort: Pulmonary effort is normal.  Abdominal:     General: There is no distension.  Musculoskeletal:      Cervical back: Neck supple.  Neurological:     Mental Status: He is alert and oriented to person, place, and time.  Psychiatric:        Mood and Affect: Mood normal.        Behavior: Behavior normal.      Assessment/Plan History of cirrhosis and esophageal varices on nadolol as an outpatient. Hematemesis Plan endoscopy with possible banding if needed. The risks and the benefits of the procedure were discussed with the patient in details. He understands and verbalizes consent.  , MD 08/23/2019, 10:26 AM

## 2019-08-23 NOTE — Op Note (Signed)
[facility] Patient Name: Hunter Gray Procedure Date: 08/23/2019 MRN: [id number] Attending MD: Kerin Salen , MD Date of Birth: 1970/11/19 CSN: [id number] Age: 49 Admit Type: Outpatient Procedure:                Upper GI endoscopy Indications:              Hematemesis, For therapy of esophageal varices Providers:                Kerin Salen, MD, Dwain Sarna, RN, Lonnie "Michele Mcalpine, Technician, Lawson Radar, Technician,                            Nadene Rubins Referring MD:             Mechele Claude Medicines:                Monitored Anesthesia Care Complications:            No immediate complications. Estimated blood loss:                            Minimal. Estimated Blood Loss:     Estimated blood loss was minimal. Procedure:                Pre-Anesthesia Assessment:                           - Prior to the procedure, a History and Physical                            was performed, and patient medications and                            allergies were reviewed. The patient's tolerance of                            previous anesthesia was also reviewed. The risks                            and benefits of the procedure and the sedation                            options and risks were discussed with the patient.                            All questions were answered, and informed consent                            was obtained. Prior Anticoagulants: The patient has                            taken no previous anticoagulant or antiplatelet                            agents.  ASA Grade Assessment: III - A patient with                            severe systemic disease. After reviewing the risks                            and benefits, the patient was deemed in                            satisfactory condition to undergo the procedure.                           After obtaining informed consent, the endoscope was                             passed under direct vision. Throughout the                            procedure, the patient's blood pressure, pulse, and                            oxygen saturations were monitored continuously. The                            GIF-H190 ([id number]) Olympus gastroscope was                            introduced through the mouth, and advanced to the                            second part of duodenum. The upper GI endoscopy was                            accomplished without difficulty. The patient                            tolerated the procedure well. Scope In: Scope Out: Findings:      Grade III varices were found in the middle third of the esophagus and in       the lower third of the esophagus. They were large in size. Six bands       were deployed but only four bands successfully placed with complete       eradication, resulting in deflation of varices. There was no bleeding at       the end of the procedure. Two bands fell off and there was bleeding       noted thereafter, but bands were placed successfully and the bleeding       stopped. There was evidence of scarring from previous banding.      Moderate portal hypertensive gastropathy was found in the stomach.      The examined duodenum was normal.      The cardia and gastric fundus were normal on retroflexion. Impression:               - Grade III esophageal varices. Completely  eradicated. Banded.                           - Portal hypertensive gastropathy.                           - Normal examined duodenum.                           - No specimens collected. Moderate Sedation:      Patient did not receive moderate sedation for this procedure, but       instead received monitored anesthesia care. Recommendation:           - Clear liquid diet today.                           - Continue present medications.                           - Repeat upper endoscopy in 6 weeks for retreatment.                            - Return to GI office/ Dr.Brahmbhatt at appointment                            to be scheduled. Procedure Code(s):        --- Professional ---                           [phone], Esophagogastroduodenoscopy, flexible,                            transoral; with band ligation of esophageal/gastric                            varices Diagnosis Code(s):        --- Professional ---                           I85.00, Esophageal varices without bleeding                           K76.6, Portal hypertension                           K31.89, Other diseases of stomach and duodenum                           K92.0, Hematemesis CPT copyright 2019 [employer]. All rights reserved. The codes documented in this report are preliminary and upon coder review may  be revised to meet current compliance requirements. Kerin Salen, MD 08/23/2019 10:59:00 AM This report has been signed electronically. Number of Addenda: 0

## 2019-08-23 NOTE — Anesthesia Postprocedure Evaluation (Signed)
Anesthesia Post Note  Patient: Hunter Gray  Procedure(s) Performed: ESOPHAGOGASTRODUODENOSCOPY (EGD) WITH PROPOFOL (N/A ) ESOPHAGEAL BANDING     Patient location during evaluation: Endoscopy Anesthesia Type: MAC Level of consciousness: awake and alert Pain management: pain level controlled Vital Signs Assessment: post-procedure vital signs reviewed and stable Respiratory status: spontaneous breathing, nonlabored ventilation, respiratory function stable and patient connected to nasal cannula oxygen Cardiovascular status: blood pressure returned to baseline and stable Postop Assessment: no apparent nausea or vomiting Anesthetic complications: no   No complications documented.  Last Vitals:  Vitals:   08/23/19 1110 08/23/19 1120  BP: 108/61 123/65  Pulse: 70 68  Resp: 12 17  Temp:    SpO2: 96% 96%    Last Pain:  Vitals:   08/23/19 1056  TempSrc:   PainSc: 6                  Chelsey L Woodrum

## 2019-08-25 ENCOUNTER — Encounter (HOSPITAL_COMMUNITY): Payer: Self-pay | Admitting: Gastroenterology

## 2019-08-31 ENCOUNTER — Other Ambulatory Visit: Payer: Self-pay | Admitting: Gastroenterology

## 2019-08-31 ENCOUNTER — Other Ambulatory Visit (HOSPITAL_COMMUNITY): Payer: Self-pay | Admitting: Gastroenterology

## 2019-08-31 DIAGNOSIS — K7460 Unspecified cirrhosis of liver: Secondary | ICD-10-CM

## 2019-09-04 ENCOUNTER — Telehealth: Payer: Self-pay | Admitting: Gastroenterology

## 2019-09-06 ENCOUNTER — Ambulatory Visit (HOSPITAL_COMMUNITY): Payer: BC Managed Care – PPO

## 2019-09-06 ENCOUNTER — Encounter (HOSPITAL_COMMUNITY): Payer: Self-pay

## 2019-09-06 ENCOUNTER — Other Ambulatory Visit: Payer: Self-pay

## 2019-09-06 NOTE — Progress Notes (Signed)
Attempted to obtain medical history via telephone, unable to reach at this time. I left a voicemail to return pre surgical testing department's phone call.  

## 2019-09-12 ENCOUNTER — Other Ambulatory Visit (HOSPITAL_COMMUNITY)
Admission: RE | Admit: 2019-09-12 | Discharge: 2019-09-12 | Disposition: A | Discharge status: Home / Self Care | Payer: BC Managed Care – PPO | Source: Ambulatory Visit | Attending: Gastroenterology | Admitting: Gastroenterology

## 2019-09-12 ENCOUNTER — Ambulatory Visit (HOSPITAL_COMMUNITY): Payer: BC Managed Care – PPO

## 2019-09-12 DIAGNOSIS — Z20822 Contact with and (suspected) exposure to covid-19: Secondary | ICD-10-CM | POA: Diagnosis not present

## 2019-09-12 DIAGNOSIS — Z01812 Encounter for preprocedural laboratory examination: Secondary | ICD-10-CM | POA: Diagnosis not present

## 2019-09-13 LAB — SARS CORONAVIRUS 2 (TAT 6-24 HRS): SARS Coronavirus 2: NEGATIVE

## 2019-09-14 NOTE — Progress Notes (Signed)
Called patient for precall, unable to leave message.

## 2019-09-15 ENCOUNTER — Ambulatory Visit (HOSPITAL_COMMUNITY)
Admission: RE | Admit: 2019-09-15 | Discharge: 2019-09-15 | Disposition: A | Discharge status: Home / Self Care | Payer: BC Managed Care – PPO | Attending: Gastroenterology | Admitting: Gastroenterology

## 2019-09-15 ENCOUNTER — Encounter (HOSPITAL_COMMUNITY)
Admission: RE | Disposition: A | Discharge status: Home / Self Care | Payer: Self-pay | Source: Home / Self Care | Attending: Gastroenterology

## 2019-09-15 ENCOUNTER — Encounter (HOSPITAL_COMMUNITY): Payer: Self-pay | Admitting: Gastroenterology

## 2019-09-15 ENCOUNTER — Ambulatory Visit (HOSPITAL_COMMUNITY): Payer: BC Managed Care – PPO | Admitting: Certified Registered Nurse Anesthetist

## 2019-09-15 ENCOUNTER — Other Ambulatory Visit: Payer: Self-pay

## 2019-09-15 DIAGNOSIS — Z888 Allergy status to other drugs, medicaments and biological substances status: Secondary | ICD-10-CM | POA: Diagnosis not present

## 2019-09-15 DIAGNOSIS — E119 Type 2 diabetes mellitus without complications: Secondary | ICD-10-CM | POA: Diagnosis not present

## 2019-09-15 DIAGNOSIS — Z79899 Other long term (current) drug therapy: Secondary | ICD-10-CM | POA: Diagnosis not present

## 2019-09-15 DIAGNOSIS — Z87891 Personal history of nicotine dependence: Secondary | ICD-10-CM | POA: Insufficient documentation

## 2019-09-15 DIAGNOSIS — K7460 Unspecified cirrhosis of liver: Secondary | ICD-10-CM | POA: Diagnosis not present

## 2019-09-15 DIAGNOSIS — K2970 Gastritis, unspecified, without bleeding: Secondary | ICD-10-CM | POA: Insufficient documentation

## 2019-09-15 DIAGNOSIS — Z7984 Long term (current) use of oral hypoglycemic drugs: Secondary | ICD-10-CM | POA: Diagnosis not present

## 2019-09-15 DIAGNOSIS — I8510 Secondary esophageal varices without bleeding: Secondary | ICD-10-CM | POA: Diagnosis present

## 2019-09-15 HISTORY — PX: GASTRIC VARICES BANDING: SHX5519

## 2019-09-15 HISTORY — PX: ESOPHAGOGASTRODUODENOSCOPY (EGD) WITH PROPOFOL: SHX5813

## 2019-09-15 LAB — GLUCOSE, CAPILLARY: Glucose-Capillary: 120 mg/dL — ABNORMAL HIGH (ref 70–99)

## 2019-09-15 SURGERY — ESOPHAGOGASTRODUODENOSCOPY (EGD) WITH PROPOFOL
Anesthesia: Monitor Anesthesia Care

## 2019-09-15 MED ORDER — SODIUM CHLORIDE 0.9 % IV SOLN: INTRAVENOUS | Status: DC

## 2019-09-15 MED ORDER — ONDANSETRON HCL 4 MG/2ML IJ SOLN
INTRAMUSCULAR | Status: AC
  Filled 2019-09-15: qty 2

## 2019-09-15 MED ORDER — PROPOFOL 500 MG/50ML IV EMUL
INTRAVENOUS | Status: DC | PRN
  Administered 2019-09-15: 40 mg via INTRAVENOUS
  Administered 2019-09-15: 150 ug/kg/min via INTRAVENOUS

## 2019-09-15 MED ORDER — ONDANSETRON HCL 4 MG/2ML IJ SOLN
4.0000 mg | Freq: Once | INTRAMUSCULAR | Status: AC
  Administered 2019-09-15: 4 mg via INTRAVENOUS

## 2019-09-15 MED ORDER — LACTATED RINGERS IV SOLN
INTRAVENOUS | Status: DC
  Administered 2019-09-15: 1000 mL via INTRAVENOUS

## 2019-09-15 SURGICAL SUPPLY — 14 items

## 2019-09-15 NOTE — Discharge Instructions (Signed)

## 2019-09-15 NOTE — Anesthesia Postprocedure Evaluation (Signed)
Anesthesia Post Note  Patient: Hunter Gray  Procedure(s) Performed: ESOPHAGOGASTRODUODENOSCOPY (EGD) WITH PROPOFOL with Band Ligation of Varices (N/A ) GASTRIC VARICES BANDING (N/A )     Patient location during evaluation: PACU Anesthesia Type: MAC Level of consciousness: awake and alert Pain management: pain level controlled Vital Signs Assessment: post-procedure vital signs reviewed and stable Respiratory status: spontaneous breathing and respiratory function stable Cardiovascular status: stable Postop Assessment: no apparent nausea or vomiting Anesthetic complications: no   No complications documented.  Last Vitals:  Vitals:   09/15/19 1230 09/15/19 1240  BP: (!) 128/57 (!) 141/63  Pulse: 60 (!) 59  Resp: 19 (!) 22  Temp:    SpO2: 96% 98%    Last Pain:  Vitals:   09/15/19 1240  TempSrc:   PainSc: 5                  Candra R Rosalena Mccorry

## 2019-09-15 NOTE — H&P (Signed)
   Hunter Gray is a 49 y.o. male with past medical history of cirrhosis complicated by esophageal varices.  The various methods of treatment have been discussed with the patient and family. After consideration of risks, benefits and other options for treatment, the patient has consented to  Procedure(s): EGD with possible band ligation as a surgical intervention .  The patient's history has been reviewed, patient examined, no change in status, stable for surgery.  I have reviewed the patient's chart and labs.  Questions were answered to the patient's satisfaction.   Risks (bleeding, infection, bowel perforation that could require surgery, sedation-related changes in cardiopulmonary systems), benefits (identification and possible treatment of source of symptoms, exclusion of certain causes of symptoms), and alternatives (watchful waiting, radiographic imaging studies, empiric medical treatment)  were explained to patient/family in detail and patient wishes to proceed.   Kathi Der MD, FACP 09/15/2019, 11:39 AM  Contact #  [phone]

## 2019-09-15 NOTE — Anesthesia Preprocedure Evaluation (Addendum)
Anesthesia Evaluation  Patient identified by MRN, date of birth, ID band  Reviewed: Allergy & Precautions, NPO status , Patient's Chart, lab work & pertinent test results  Airway Mallampati: II  TM Distance: >3 FB Neck ROM: Full    Dental no notable dental hx.    Pulmonary former smoker,    Pulmonary exam normal breath sounds clear to auscultation       Cardiovascular Exercise Tolerance: Good Normal cardiovascular exam Rhythm:Regular Rate:Normal     Neuro/Psych negative neurological ROS  negative psych ROS   GI/Hepatic (+) Cirrhosis   Esophageal Varices  Substance abuse: remote h/o of illicit drug use.  ,   Endo/Other  diabetes, Type 2, Insulin Dependent  Renal/GU negative Renal ROS  negative genitourinary   Musculoskeletal negative musculoskeletal ROS (+)   Abdominal   Peds  Hematology negative hematology ROS (+)   Anesthesia Other Findings   Reproductive/Obstetrics negative OB ROS                            Anesthesia Physical Anesthesia Plan  ASA: III  Anesthesia Plan: MAC   Post-op Pain Management:    Induction:   PONV Risk Score and Plan: Propofol infusion, Treatment may vary due to age or medical condition and TIVA  Airway Management Planned: Nasal Cannula  Additional Equipment:   Intra-op Plan:   Post-operative Plan:   Informed Consent: I have reviewed the patients History and Physical, chart, labs and discussed the procedure including the risks, benefits and alternatives for the proposed anesthesia with the patient or authorized representative who has indicated his/her understanding and acceptance.       Plan Discussed with:   Anesthesia Plan Comments:        Anesthesia Quick Evaluation

## 2019-09-15 NOTE — Op Note (Signed)
[facility] Patient Name: Hunter Gray Procedure Date: 09/15/2019 MRN: [id number] Attending MD: Kathi Der , MD Date of Birth: [birth year] CSN: [id number] Age: 49 Admit Type: Outpatient Procedure:                Upper GI endoscopy Indications:              For therapy of esophageal varices Providers:                Kathi Der, MD, Adolph Pollack, RN, Harrington Challenger, Technician Referring MD:              Medicines:                Sedation Administered by an Anesthesia Professional Complications:            No immediate complications. Estimated Blood Loss:     Estimated blood loss was minimal. Procedure:                Pre-Anesthesia Assessment:                           - Prior to the procedure, a History and Physical                            was performed, and patient medications and                            allergies were reviewed. The patient's tolerance of                            previous anesthesia was also reviewed. The risks                            and benefits of the procedure and the sedation                            options and risks were discussed with the patient.                            All questions were answered, and informed consent                            was obtained. Prior Anticoagulants: The patient has                            taken no previous anticoagulant or antiplatelet                            agents. ASA Grade Assessment: II - A patient with                            mild systemic disease. After reviewing the risks  and benefits, the patient was deemed in                            satisfactory condition to undergo the procedure.                           After obtaining informed consent, the endoscope was                            passed under direct vision. Throughout the                            procedure, the patient's blood pressure, pulse, and                             oxygen saturations were monitored continuously. The                            GIF-H190 ([id number] Olympus gastroscope was                            introduced through the mouth, and advanced to the                            second part of duodenum. The upper GI endoscopy was                            accomplished without difficulty. The patient                            tolerated the procedure well. Scope In: Scope Out: Findings:      Four columns of large (> 5 mm) varices with no bleeding and no stigmata       of recent bleeding were found in the mid esophagus and in the distal       esophagus,. Red wale signs were present. Stigmata of prior treatment       were evident. Five bands were successfully placed with complete       eradication, resulting in deflation of varices. There was no bleeding at       the end of the maneuver.      Diffuse mild inflammation was found in the entire examined stomach.      The cardia and gastric fundus were normal on retroflexion.      The duodenal bulb, first portion of the duodenum and second portion of       the duodenum were normal. Impression:               - Large (> 5 mm) esophageal varices with no                            bleeding and no stigmata of recent bleeding.                            Completely eradicated. Banded.                           -  Gastritis.                           - Normal duodenal bulb, first portion of the                            duodenum and second portion of the duodenum.                           - No specimens collected. Moderate Sedation:      Moderate (conscious) sedation was personally administered by an       anesthesia professional. The following parameters were monitored: oxygen       saturation, heart rate, blood pressure, and response to care. Recommendation:           - Patient has a contact number available for                            emergencies. The signs and  symptoms of potential                            delayed complications were discussed with the                            patient. Return to normal activities tomorrow.                            Written discharge instructions were provided to the                            patient.                           - Resume previous diet.                           - Continue present medications.                           - Await pathology results.                           - Repeat upper endoscopy in 4 weeks for retreatment.                           - Return to my office as previously scheduled. Procedure Code(s):        --- Professional ---                           [phone], Esophagogastroduodenoscopy, flexible,                            transoral; with band ligation of esophageal/gastric                            varices Diagnosis Code(s):        --- Professional ---  I85.00, Esophageal varices without bleeding                           K29.70, Gastritis, unspecified, without bleeding CPT copyright 2019 [employer]. All rights reserved. The codes documented in this report are preliminary and upon coder review may  be revised to meet current compliance requirements. Kathi Der, MD Kathi Der, MD 09/15/2019 12:02:54 PM Number of Addenda: 0

## 2019-09-15 NOTE — Transfer of Care (Signed)
Immediate Anesthesia Transfer of Care Note  Patient: Hunter Gray  Procedure(s) Performed: ESOPHAGOGASTRODUODENOSCOPY (EGD) WITH PROPOFOL with Band Ligation of Varices (N/A ) GASTRIC VARICES BANDING (N/A )  Patient Location: Endoscopy Unit  Anesthesia Type:MAC  Level of Consciousness: drowsy and patient cooperative  Airway & Oxygen Therapy: Patient Spontanous Breathing and Patient connected to face mask oxygen  Post-op Assessment: Report given to RN and Post -op Vital signs reviewed and stable  Post vital signs: Reviewed and stable  Last Vitals:  Vitals Value Taken Time  BP 109/72   Temp    Pulse 65 09/15/19 1205  Resp 16 09/15/19 1205  SpO2 100 % 09/15/19 1205  Vitals shown include unvalidated device data.  Last Pain:  Vitals:   09/15/19 1051  TempSrc: Oral  PainSc: 0-No pain         Complications: No complications documented.

## 2019-09-18 ENCOUNTER — Ambulatory Visit (HOSPITAL_COMMUNITY)
Admission: RE | Admit: 2019-09-18 | Discharge: 2019-09-18 | Disposition: A | Discharge status: Home / Self Care | Payer: BC Managed Care – PPO | Source: Ambulatory Visit | Attending: Gastroenterology | Admitting: Gastroenterology

## 2019-09-18 ENCOUNTER — Other Ambulatory Visit: Payer: Self-pay

## 2019-09-18 ENCOUNTER — Encounter (HOSPITAL_COMMUNITY): Payer: Self-pay

## 2019-09-18 DIAGNOSIS — K7460 Unspecified cirrhosis of liver: Secondary | ICD-10-CM | POA: Diagnosis present

## 2019-09-18 MED ORDER — IOHEXOL 300 MG/ML  SOLN
100.0000 mL | Freq: Once | INTRAMUSCULAR | Status: AC | PRN
  Administered 2019-09-18: 100 mL via INTRAVENOUS

## 2019-09-19 ENCOUNTER — Encounter (HOSPITAL_COMMUNITY): Payer: Self-pay | Admitting: Gastroenterology

## 2019-09-19 ENCOUNTER — Other Ambulatory Visit: Payer: Self-pay | Admitting: Gastroenterology

## 2019-10-17 NOTE — Progress Notes (Signed)
Completed endo call.  However patient states he does not have an appointment for his covid swab to be done.  Advised patient to call Dr. Rich Brave ofice to get that scheduled in the next 1-2 days.  Patient voiced understanding.

## 2019-10-31 ENCOUNTER — Ambulatory Visit (HOSPITAL_COMMUNITY): Admit: 2019-10-31 | Payer: BC Managed Care – PPO | Admitting: Gastroenterology

## 2019-10-31 SURGERY — ESOPHAGOGASTRODUODENOSCOPY (EGD) WITH PROPOFOL
Anesthesia: Monitor Anesthesia Care

## 2019-11-14 ENCOUNTER — Other Ambulatory Visit (HOSPITAL_COMMUNITY)
Admission: RE | Admit: 2019-11-14 | Discharge: 2019-11-14 | Disposition: A | Discharge status: Home / Self Care | Payer: BC Managed Care – PPO | Source: Ambulatory Visit | Attending: Gastroenterology | Admitting: Gastroenterology

## 2019-11-14 DIAGNOSIS — K7460 Unspecified cirrhosis of liver: Secondary | ICD-10-CM | POA: Diagnosis not present

## 2019-11-14 DIAGNOSIS — Z20822 Contact with and (suspected) exposure to covid-19: Secondary | ICD-10-CM | POA: Diagnosis not present

## 2019-11-14 DIAGNOSIS — Z79899 Other long term (current) drug therapy: Secondary | ICD-10-CM | POA: Diagnosis not present

## 2019-11-14 DIAGNOSIS — K2950 Unspecified chronic gastritis without bleeding: Secondary | ICD-10-CM | POA: Diagnosis not present

## 2019-11-14 DIAGNOSIS — Z01812 Encounter for preprocedural laboratory examination: Secondary | ICD-10-CM | POA: Insufficient documentation

## 2019-11-14 DIAGNOSIS — I8510 Secondary esophageal varices without bleeding: Secondary | ICD-10-CM | POA: Diagnosis not present

## 2019-11-14 DIAGNOSIS — Z7984 Long term (current) use of oral hypoglycemic drugs: Secondary | ICD-10-CM | POA: Diagnosis not present

## 2019-11-14 DIAGNOSIS — I8500 Esophageal varices without bleeding: Secondary | ICD-10-CM | POA: Diagnosis present

## 2019-11-14 LAB — SARS CORONAVIRUS 2 (TAT 6-24 HRS): SARS Coronavirus 2: NEGATIVE

## 2019-11-15 NOTE — Anesthesia Preprocedure Evaluation (Addendum)
Anesthesia Evaluation  Patient identified by MRN, date of birth, ID band Patient awake    Reviewed: Allergy & Precautions, NPO status , Patient's Chart, lab work & pertinent test results  History of Anesthesia Complications Negative for: history of anesthetic complications  Airway Mallampati: II  TM Distance: >3 FB Neck ROM: Full    Dental  (+) Dental Advisory Given, Teeth Intact   Pulmonary former smoker,    Pulmonary exam normal        Cardiovascular negative cardio ROS Normal cardiovascular exam     Neuro/Psych negative neurological ROS  negative psych ROS   GI/Hepatic negative GI ROS, (+) Cirrhosis   Esophageal Varices    ,   Endo/Other  diabetes, Type 2, Oral Hypoglycemic Agents Obesity   Renal/GU negative Renal ROS     Musculoskeletal negative musculoskeletal ROS (+)   Abdominal   Peds  Hematology negative hematology ROS (+)   Anesthesia Other Findings Covid test negative   Reproductive/Obstetrics                            Anesthesia Physical Anesthesia Plan  ASA: III  Anesthesia Plan: MAC   Post-op Pain Management:    Induction: Intravenous  PONV Risk Score and Plan: 1 and Propofol infusion and Treatment may vary due to age or medical condition  Airway Management Planned: Nasal Cannula and Natural Airway  Additional Equipment: None  Intra-op Plan:   Post-operative Plan:   Informed Consent: I have reviewed the patients History and Physical, chart, labs and discussed the procedure including the risks, benefits and alternatives for the proposed anesthesia with the patient or authorized representative who has indicated his/her understanding and acceptance.       Plan Discussed with: CRNA, Anesthesiologist and Surgeon  Anesthesia Plan Comments:        Anesthesia Quick Evaluation

## 2019-11-16 ENCOUNTER — Encounter (HOSPITAL_COMMUNITY)
Admission: RE | Disposition: A | Discharge status: Home / Self Care | Payer: Self-pay | Source: Home / Self Care | Attending: Gastroenterology

## 2019-11-16 ENCOUNTER — Ambulatory Visit (HOSPITAL_COMMUNITY)
Admission: RE | Admit: 2019-11-16 | Discharge: 2019-11-16 | Disposition: A | Discharge status: Home / Self Care | Payer: BC Managed Care – PPO | Attending: Gastroenterology | Admitting: Gastroenterology

## 2019-11-16 ENCOUNTER — Ambulatory Visit (HOSPITAL_COMMUNITY): Payer: BC Managed Care – PPO | Admitting: Certified Registered Nurse Anesthetist

## 2019-11-16 ENCOUNTER — Other Ambulatory Visit: Payer: Self-pay

## 2019-11-16 ENCOUNTER — Encounter (HOSPITAL_COMMUNITY): Payer: Self-pay | Admitting: Gastroenterology

## 2019-11-16 DIAGNOSIS — K2950 Unspecified chronic gastritis without bleeding: Secondary | ICD-10-CM | POA: Insufficient documentation

## 2019-11-16 DIAGNOSIS — Z79899 Other long term (current) drug therapy: Secondary | ICD-10-CM | POA: Insufficient documentation

## 2019-11-16 DIAGNOSIS — Z7984 Long term (current) use of oral hypoglycemic drugs: Secondary | ICD-10-CM | POA: Insufficient documentation

## 2019-11-16 DIAGNOSIS — Z20822 Contact with and (suspected) exposure to covid-19: Secondary | ICD-10-CM | POA: Insufficient documentation

## 2019-11-16 DIAGNOSIS — K7460 Unspecified cirrhosis of liver: Secondary | ICD-10-CM | POA: Diagnosis not present

## 2019-11-16 DIAGNOSIS — I8510 Secondary esophageal varices without bleeding: Secondary | ICD-10-CM | POA: Insufficient documentation

## 2019-11-16 HISTORY — PX: ESOPHAGEAL BANDING: SHX5518

## 2019-11-16 HISTORY — PX: ESOPHAGOGASTRODUODENOSCOPY (EGD) WITH PROPOFOL: SHX5813

## 2019-11-16 LAB — GLUCOSE, CAPILLARY: Glucose-Capillary: 133 mg/dL — ABNORMAL HIGH (ref 70–99)

## 2019-11-16 SURGERY — ESOPHAGOGASTRODUODENOSCOPY (EGD) WITH PROPOFOL
Anesthesia: Monitor Anesthesia Care

## 2019-11-16 MED ORDER — CLOTRIMAZOLE 10 MG MT TROC: 10.0000 mg | Freq: Every day | OROMUCOSAL | 0 refills | Status: AC

## 2019-11-16 MED ORDER — LACTATED RINGERS IV SOLN
INTRAVENOUS | Status: DC
  Administered 2019-11-16: 1000 mL via INTRAVENOUS

## 2019-11-16 MED ORDER — PROPOFOL 1000 MG/100ML IV EMUL
INTRAVENOUS | Status: AC
  Filled 2019-11-16: qty 100

## 2019-11-16 MED ORDER — PROPOFOL 10 MG/ML IV BOLUS
INTRAVENOUS | Status: DC | PRN
  Administered 2019-11-16: 40 mg via INTRAVENOUS
  Administered 2019-11-16: 30 mg via INTRAVENOUS

## 2019-11-16 MED ORDER — LIDOCAINE 2% (20 MG/ML) 5 ML SYRINGE
INTRAMUSCULAR | Status: DC | PRN
  Administered 2019-11-16: 60 mg via INTRAVENOUS

## 2019-11-16 MED ORDER — PROPOFOL 500 MG/50ML IV EMUL
INTRAVENOUS | Status: DC | PRN
  Administered 2019-11-16: 150 ug/kg/min via INTRAVENOUS

## 2019-11-16 MED ORDER — PANTOPRAZOLE SODIUM 40 MG PO TBEC: 40.0000 mg | DELAYED_RELEASE_TABLET | Freq: Every day | ORAL | 1 refills | Status: DC

## 2019-11-16 MED ORDER — PROPOFOL 500 MG/50ML IV EMUL
INTRAVENOUS | Status: AC
  Filled 2019-11-16: qty 50

## 2019-11-16 SURGICAL SUPPLY — 14 items

## 2019-11-16 NOTE — Anesthesia Postprocedure Evaluation (Signed)
Anesthesia Post Note  Patient: Hunter Gray  Procedure(s) Performed: ESOPHAGOGASTRODUODENOSCOPY (EGD) WITH PROPOFOL (N/A ) ESOPHAGEAL BANDING (N/A )     Patient location during evaluation: PACU Anesthesia Type: MAC Level of consciousness: awake and alert Pain management: pain level controlled Vital Signs Assessment: post-procedure vital signs reviewed and stable Respiratory status: spontaneous breathing, nonlabored ventilation and respiratory function stable Cardiovascular status: stable and blood pressure returned to baseline Anesthetic complications: no   No complications documented.  Last Vitals:  Vitals:   11/16/19 0837 11/16/19 0840  BP: (!) 99/56 113/60  Pulse: 75 74  Resp: 16 15  Temp: 36.7 C   SpO2: 99% 95%    Last Pain:  Vitals:   11/16/19 0840  TempSrc:   PainSc: 0-No pain                 Audry Pili

## 2019-11-16 NOTE — Op Note (Signed)
[facility] Patient Name: Hunter Gray Procedure Date: 11/16/2019 MRN: [id number] Attending MD: Kathi Der , MD Date of Birth: 1970-11-13 CSN: [id number] Age: 49 Admit Type: Outpatient Procedure:                Upper GI endoscopy Indications:              For therapy of esophageal varices Providers:                Kathi Der, MD, Dayton Bailiff, RN, Charlett Lango, RN, Michele Mcalpine Technician Referring MD:              Medicines:                Sedation Administered by an Anesthesia Professional Complications:            No immediate complications. Estimated Blood Loss:     Estimated blood loss was minimal. Procedure:                Pre-Anesthesia Assessment:                           - Prior to the procedure, a History and Physical                            was performed, and patient medications and                            allergies were reviewed. The patient's tolerance of                            previous anesthesia was also reviewed. The risks                            and benefits of the procedure and the sedation                            options and risks were discussed with the patient.                            All questions were answered, and informed consent                            was obtained. Prior Anticoagulants: The patient has                            taken no previous anticoagulant or antiplatelet                            agents. ASA Grade Assessment: III - A patient with                            severe systemic disease. After reviewing the risks  and benefits, the patient was deemed in                            satisfactory condition to undergo the procedure.                           After obtaining informed consent, the endoscope was                            passed under direct vision. Throughout the                            procedure, the patient's blood  pressure, pulse, and                            oxygen saturations were monitored continuously. The                            GIF-H190 ([id number]) Olympus gastroscope was                            introduced through the mouth, and advanced to the                            second part of duodenum. The upper GI endoscopy was                            accomplished without difficulty. The patient                            tolerated the procedure well. Scope In: Scope Out: Findings:      Three columns of large (> 5 mm) varices with no bleeding and no stigmata       of recent bleeding were found in the distal esophagus,. Red wale signs       were present. Stigmata of prior treatment were evident. Three bands were       successfully placed with complete eradication, resulting in deflation of       varices. There was no bleeding at the end of the maneuver.      Patchy, white plaques were found in the mid esophagus and in the distal       esophagus. Biopsy is contraindicated because of underlying esophageal       varices.      Diffuse mild inflammation was found in the entire examined stomach.      The cardia and gastric fundus were normal on retroflexion.      The duodenal bulb, first portion of the duodenum and second portion of       the duodenum were normal. Impression:               - Large (> 5 mm) esophageal varices with no                            bleeding and no stigmata of recent bleeding.  Completely eradicated. Banded.                           - Esophageal plaques were found, suspicious for                            candidiasis. Biopsy is contraindicated.                           - Chronic gastritis.                           - Normal duodenal bulb, first portion of the                            duodenum and second portion of the duodenum.                           - No specimens collected. Moderate Sedation:      Moderate (conscious) sedation was  personally administered by an       anesthesia professional. The following parameters were monitored: oxygen       saturation, heart rate, blood pressure, and response to care. Recommendation:           - Patient has a contact number available for                            emergencies. The signs and symptoms of potential                            delayed complications were discussed with the                            patient. Return to normal activities tomorrow.                            Written discharge instructions were provided to the                            patient.                           - Resume previous diet.                           - Continue present medications.                           - Repeat upper endoscopy in 2 months for                            retreatment.                           - Mycelex (clotrimazole) 10 mg lozenge 5x/day for  10 days.                           - Return to my office as previously scheduled. Procedure Code(s):        --- Professional ---                           [id number], Esophagogastroduodenoscopy, flexible,                            transoral; with band ligation of esophageal/gastric                            varices Diagnosis Code(s):        --- Professional ---                           I85.00, Esophageal varices without bleeding                           K22.9, Disease of esophagus, unspecified                           K29.50, Unspecified chronic gastritis without                            bleeding CPT copyright 2019 American [employer] Association. All rights reserved. The codes documented in this report are preliminary and upon coder review may  be revised to meet current compliance requirements. Kathi Der, MD Kathi Der, MD 11/16/2019 8:37:30 AM Number of Addenda: 0

## 2019-11-16 NOTE — Discharge Instructions (Signed)
YOU HAD AN ENDOSCOPIC PROCEDURE TODAY: Refer to the procedure report and other information in the discharge instructions given to you for any specific questions about what was found during the examination. If this information does not answer your questions, please call [place] office at [phone] to clarify.  ° °YOU SHOULD EXPECT: Some feelings of bloating in the abdomen. Passage of more gas than usual. Walking can help get rid of the air that was put into your GI tract during the procedure and reduce the bloating. If you had a lower endoscopy (such as a colonoscopy or flexible sigmoidoscopy) you may notice spotting of blood in your stool or on the toilet paper. Some abdominal soreness may be present for a day or two, also. ° °DIET: Your first meal following the procedure should be a light meal and then it is ok to progress to your normal diet. A half-sandwich or bowl of soup is an example of a good first meal. Heavy or fried foods are harder to digest and may make you feel nauseous or bloated. Drink plenty of fluids but you should avoid alcoholic beverages for 24 hours. If you had a esophageal dilation, please see attached instructions for diet.   ° °ACTIVITY: Your care partner should take you home directly after the procedure. You should plan to take it easy, moving slowly for the rest of the day. You can resume normal activity the day after the procedure however YOU SHOULD NOT DRIVE, use power tools, machinery or perform tasks that involve climbing or major physical exertion for 24 hours (because of the sedation medicines used during the test).  ° °SYMPTOMS TO REPORT IMMEDIATELY: °A gastroenterologist can be reached at any hour. Please call [phone]  for any of the following symptoms:  °Following lower endoscopy (colonoscopy, flexible sigmoidoscopy) °Excessive amounts of blood in the stool  °Significant tenderness, worsening of abdominal pains  °Swelling of the abdomen that is new, acute  °Fever of 100° or  higher  °Following upper endoscopy (EGD, EUS, ERCP, esophageal dilation) °Vomiting of blood or coffee ground material  °New, significant abdominal pain  °New, significant chest pain or pain under the shoulder blades  °Painful or persistently difficult swallowing  °New shortness of breath  °Black, tarry-looking or red, bloody stools ° °FOLLOW UP:  °If any biopsies were taken you will be contacted by phone or by letter within the next 1-3 weeks. Call [phone]  if you have not heard about the biopsies in 3 weeks.  °Please also call with any specific questions about appointments or follow up tests. ° °

## 2019-11-16 NOTE — Transfer of Care (Signed)
Immediate Anesthesia Transfer of Care Note  Patient: Hunter Gray  Procedure(s) Performed: ESOPHAGOGASTRODUODENOSCOPY (EGD) WITH PROPOFOL (N/A ) ESOPHAGEAL BANDING (N/A )  Patient Location: Endoscopy Unit  Anesthesia Type:MAC  Level of Consciousness: awake, alert  and oriented  Airway & Oxygen Therapy: Patient Spontanous Breathing and Patient connected to face mask oxygen  Post-op Assessment: Report given to RN and Post -op Vital signs reviewed and stable  Post vital signs: Reviewed and stable  Last Vitals:  Vitals Value Taken Time  BP 119/82   Temp    Pulse 74   Resp 14   SpO2 99%     Last Pain:  Vitals:   11/16/19 0753  TempSrc: Oral  PainSc: 0-No pain         Complications: No complications documented.

## 2019-11-16 NOTE — H&P (Signed)
Primary Care Physician:  Larkin Ina Primary Gastroenterologist:  Dr.Akoni Parton  Reason for Visit : EGD for variceal band ligation  HPI: Hunter Gray is a 49 y.o. male with past medical history of cirrhosis complicated by esophageal varices presented to [facility] for outpatient EGD.  Last EGD in September 2021 showed large esophageal varices and 5 bands were placed.  Patient denies any acute issues since last visit.  Denies any abdominal pain, nausea or vomiting.  Denies blood in the stool or black stool.  Denies confusion.  Overall doing well from GI standpoint.  Currently on nadolol.  Past Medical History:  Diagnosis Date  . Cirrhosis (HCC)   . Cirrhosis of liver (HCC)   . Diabetes mellitus without complication (HCC)   . Esophageal varices (HCC)   . Vision abnormalities     Past Surgical History:  Procedure Laterality Date  . ESOPHAGEAL BANDING  05/14/2019   Procedure: ESOPHAGEAL BANDING;  Surgeon: Kerin Salen, MD;  Location: [facility];  Service: Gastroenterology;;  . ESOPHAGEAL BANDING  08/23/2019   Procedure: ESOPHAGEAL BANDING;  Surgeon: Kerin Salen, MD;  Location: [facility];  Service: Gastroenterology;;  . ESOPHAGOGASTRODUODENOSCOPY (EGD) WITH PROPOFOL N/A 05/14/2019   Procedure: ESOPHAGOGASTRODUODENOSCOPY (EGD) WITH PROPOFOL;  Surgeon: Kerin Salen, MD;  Location: [facility];  Service: Gastroenterology;  Laterality: N/A;  . ESOPHAGOGASTRODUODENOSCOPY (EGD) WITH PROPOFOL N/A 08/23/2019   Procedure: ESOPHAGOGASTRODUODENOSCOPY (EGD) WITH PROPOFOL;  Surgeon: Kerin Salen, MD;  Location: [facility];  Service: Gastroenterology;  Laterality: N/A;  . ESOPHAGOGASTRODUODENOSCOPY (EGD) WITH PROPOFOL N/A 09/15/2019   Procedure: ESOPHAGOGASTRODUODENOSCOPY (EGD) WITH PROPOFOL;  Surgeon: Kathi Der, MD;  Location: [facility];  Service: Gastroenterology;  Laterality: N/A;  . GASTRIC VARICES BANDING N/A 09/15/2019   Procedure: GASTRIC VARICES BANDING;   Surgeon: Kathi Der, MD;  Location: [facility];  Service: Gastroenterology;  Laterality: N/A;  . SHOULDER ARTHROSCOPY Left age 68    Prior to Admission medications   Medication Sig Start Date End Date Taking? Authorizing Provider  fluticasone (FLONASE) 50 MCG/ACT nasal spray Place 1 spray into both nostrils 2 (two) times daily as needed for allergies or rhinitis (nasal congestion).    Yes [provider]  gabapentin (NEURONTIN) 600 MG tablet Take 600-1,200 mg by mouth See admin instructions. Take 1 tablet (600 mg) by mouth in the morning, take 1 tablet (600 mg) by mouth at lunch, & take 2 tablets (1200 mg) by mouth at bedtime. 03/30/19  Yes [provider]  nadolol (CORGARD) 20 MG tablet Take 0.5 tablets (10 mg total) by mouth daily. Patient taking differently: Take 20 mg by mouth daily.  05/19/19 09/07/20 Yes Kyle, Tyrone A, DO  SYNJARDY XR 05-998 MG TB24 Take 2 tablets by mouth daily with breakfast.  04/28/19  Yes [provider]  VICTOZA 18 MG/3ML SOPN Inject 1.8 mg into the skin daily.  11/18/17  Yes [provider]  pantoprazole (PROTONIX) 40 MG tablet Take 1 tablet (40 mg total) by mouth 2 (two) times daily. Patient not taking: Reported on 11/15/2019 05/19/19 09/07/20  Margie Ege A, DO    Scheduled Meds: Continuous Infusions: . lactated ringers 1,000 mL (11/16/19 0809)   PRN Meds:.  Allergies as of 10/25/2019 - Review Complete 09/18/2019  Allergen Reaction Noted  . Glimepiride Other (See Comments) 05/24/2019    Family History  Problem Relation Age of Onset  . Diabetes type II Mother   . Heart disease Mother   . Diabetes type II Father   . Diabetes type  II Sister     Social History   Socioeconomic History  . Marital status: Married    Spouse name: Not on file  . Number of children: Not on file  . Years of education: Not on file  . Highest education level: Not on file  Occupational History  . Not on file  Tobacco Use  . Smoking  status: Former Games developer  . Smokeless tobacco: Never Used  Vaping Use  . Vaping Use: Never used  Substance and Sexual Activity  . Alcohol use: Not Currently  . Drug use: Not Currently  . Sexual activity: Not on file  Other Topics Concern  . Not on file  Social History Narrative   ** Merged History Encounter **       Social Determinants of Health   Financial Resource Strain:   . Difficulty of Paying Living Expenses: Not on file  Food Insecurity:   . Worried About Programme researcher, broadcasting/film/video in the Last Year: Not on file  . Ran Out of Food in the Last Year: Not on file  Transportation Needs:   . Lack of Transportation (Medical): Not on file  . Lack of Transportation (Non-Medical): Not on file  Physical Activity:   . Days of Exercise per Week: Not on file  . Minutes of Exercise per Session: Not on file  Stress:   . Feeling of Stress : Not on file  Social Connections:   . Frequency of Communication with Friends and Family: Not on file  . Frequency of Social Gatherings with Friends and Family: Not on file  . Attends Religious Services: Not on file  . Active Member of Clubs or Organizations: Not on file  . Attends Banker Meetings: Not on file  . Marital Status: Not on file  Intimate Partner Violence:   . Fear of Current or Ex-Partner: Not on file  . Emotionally Abused: Not on file  . Physically Abused: Not on file  . Sexually Abused: Not on file    Review of Systems: All negative except as stated above in HPI.  Physical Exam: Vital signs: Vitals:   11/16/19 0753  BP: 118/62  Pulse: 68  Resp: 19  Temp: 98 F (36.7 C)  SpO2: 96%     General:   Alert,  Well-developed, well-nourished, pleasant and cooperative in NAD Lungs:  Clear throughout to auscultation.   No wheezes, crackles, or rhonchi. No acute distress. Heart:  Regular rate and rhythm; no murmurs, clicks, rubs,  or gallops. Abdomen: Soft, nontender, nondistended, bowel sounds present.  No peritoneal  signs Rectal:  Deferred  GI:  Lab Results: No results for input(s): WBC, HGB, HCT, PLT in the last 72 hours. BMET No results for input(s): NA, K, CL, CO2, GLUCOSE, BUN, CREATININE, CALCIUM in the last 72 hours. LFT No results for input(s): PROT, ALBUMIN, AST, ALT, ALKPHOS, BILITOT, BILIDIR, IBILI in the last 72 hours. PT/INR No results for input(s): LABPROT, INR in the last 72 hours.   Studies/Results: No results found.  Impression/Plan: -Cirrhosis of the liver complicated by esophageal varices requiring band ligation in the past.  Currently on nadolol.  Recommendations ------------------------- -Proceed with EGD with possible band ligation.  Risks (bleeding, infection, bowel perforation that could require surgery, sedation-related changes in cardiopulmonary systems), benefits (identification and possible treatment of source of symptoms, exclusion of certain causes of symptoms), and alternatives (watchful waiting, radiographic imaging studies, empiric medical treatment)  were explained to patientin detail and patient wishes to proceed.  LOS: 0 days   Kathi Der  MD, FACP 11/16/2019, 8:13 AM  Contact #  [phone]

## 2019-11-20 ENCOUNTER — Encounter (HOSPITAL_COMMUNITY): Payer: Self-pay | Admitting: Gastroenterology

## 2019-12-06 ENCOUNTER — Telehealth: Payer: Self-pay | Admitting: Unknown Physician Specialty

## 2019-12-06 ENCOUNTER — Other Ambulatory Visit: Payer: Self-pay | Admitting: Unknown Physician Specialty

## 2019-12-06 DIAGNOSIS — Z794 Long term (current) use of insulin: Secondary | ICD-10-CM

## 2019-12-06 DIAGNOSIS — E663 Overweight: Secondary | ICD-10-CM

## 2019-12-06 DIAGNOSIS — E119 Type 2 diabetes mellitus without complications: Secondary | ICD-10-CM

## 2019-12-06 DIAGNOSIS — U071 COVID-19: Secondary | ICD-10-CM

## 2019-12-06 NOTE — Telephone Encounter (Signed)
I connected by phone with [name] on 12/06/2019 at 5:04 PM to discuss the potential use of a new treatment for mild to moderate COVID-19 viral infection in non-hospitalized patients.  This patient is a 49 y.o. male that meets the FDA criteria for Emergency Use Authorization of COVID monoclonal antibody casirivimab/imdevimab, bamlanivimab/eteseviamb, or sotrovimab.  Has a (+) direct SARS-CoV-2 viral test result  Has mild or moderate COVID-19   Is NOT hospitalized due to COVID-19  Is within 10 days of symptom onset  Has at least one of the high risk factor(s) for progression to severe COVID-19 and/or hospitalization as defined in EUA.  Specific high risk criteria : BMI > 25   I have spoken and communicated the following to the patient or parent/caregiver regarding COVID monoclonal antibody treatment:  1. FDA has authorized the emergency use for the treatment of mild to moderate COVID-19 in adults and pediatric patients with positive results of direct SARS-CoV-2 viral testing who are 49 years of age and older weighing at least 40 kg, and who are at high risk for progressing to severe COVID-19 and/or hospitalization.  2. The significant known and potential risks and benefits of COVID monoclonal antibody, and the extent to which such potential risks and benefits are unknown.  3. Information on available alternative treatments and the risks and benefits of those alternatives, including clinical trials.  4. Patients treated with COVID monoclonal antibody should continue to self-isolate and use infection control measures (e.g., wear mask, isolate, social distance, avoid sharing personal items, clean and disinfect "high touch" surfaces, and frequent handwashing) according to CDC guidelines.   5. The patient or parent/caregiver has the option to accept or refuse COVID monoclonal antibody treatment.  After reviewing this information with the patient, the patient has agreed to receive one  of the available covid 19 monoclonal antibodies and will be provided an appropriate fact sheet prior to infusion. Gabriel Cirri, NP 12/06/2019 5:04 PM Sx onset 11/25

## 2019-12-07 ENCOUNTER — Ambulatory Visit (HOSPITAL_COMMUNITY)
Admission: RE | Admit: 2019-12-07 | Discharge: 2019-12-07 | Disposition: A | Discharge status: Home / Self Care | Payer: BC Managed Care – PPO | Source: Ambulatory Visit | Attending: Pulmonary Disease | Admitting: Pulmonary Disease

## 2019-12-07 DIAGNOSIS — U071 COVID-19: Secondary | ICD-10-CM | POA: Insufficient documentation

## 2019-12-07 DIAGNOSIS — Z794 Long term (current) use of insulin: Secondary | ICD-10-CM | POA: Insufficient documentation

## 2019-12-07 DIAGNOSIS — E119 Type 2 diabetes mellitus without complications: Secondary | ICD-10-CM | POA: Insufficient documentation

## 2019-12-07 DIAGNOSIS — E663 Overweight: Secondary | ICD-10-CM

## 2019-12-07 MED ORDER — FAMOTIDINE IN NACL 20-0.9 MG/50ML-% IV SOLN: 20.0000 mg | Freq: Once | INTRAVENOUS | Status: DC | PRN

## 2019-12-07 MED ORDER — ALBUTEROL SULFATE HFA 108 (90 BASE) MCG/ACT IN AERS: 2.0000 | INHALATION_SPRAY | Freq: Once | RESPIRATORY_TRACT | Status: DC | PRN

## 2019-12-07 MED ORDER — METHYLPREDNISOLONE SODIUM SUCC 125 MG IJ SOLR: 125.0000 mg | Freq: Once | INTRAMUSCULAR | Status: DC | PRN

## 2019-12-07 MED ORDER — EPINEPHRINE 0.3 MG/0.3ML IJ SOAJ: 0.3000 mg | Freq: Once | INTRAMUSCULAR | Status: DC | PRN

## 2019-12-07 MED ORDER — SODIUM CHLORIDE 0.9 % IV SOLN: INTRAVENOUS | Status: DC | PRN

## 2019-12-07 MED ORDER — SOTROVIMAB 500 MG/8ML IV SOLN
500.0000 mg | Freq: Once | INTRAVENOUS | Status: AC
  Administered 2019-12-07: 500 mg via INTRAVENOUS

## 2019-12-07 MED ORDER — DIPHENHYDRAMINE HCL 50 MG/ML IJ SOLN: 50.0000 mg | Freq: Once | INTRAMUSCULAR | Status: DC | PRN

## 2019-12-07 NOTE — Progress Notes (Signed)
Patient reviewed Fact Sheet for Patients, Parents, and Caregivers for Emergency Use Authorization (EUA) of Sotrovimab for the Treatment of Coronavirus. Patient also reviewed and is agreeable to the estimated cost of treatment. Patient is agreeable to proceed.   

## 2019-12-07 NOTE — Discharge Instructions (Signed)
10 Things You Can Do to Manage Your COVID-19 Symptoms at Home If you have possible or confirmed COVID-19: 1. Stay home from work and school. And stay away from other public places. If you must go out, avoid using any kind of public transportation, ridesharing, or taxis. 2. Monitor your symptoms carefully. If your symptoms get worse, call your healthcare provider immediately. 3. Get rest and stay hydrated. 4. If you have a medical appointment, call the healthcare provider ahead of time and tell them that you have or may have COVID-19. 5. For medical emergencies, call 911 and notify the dispatch personnel that you have or may have COVID-19. 6. Cover your cough and sneezes with a tissue or use the inside of your elbow. 7. Wash your hands often with soap and water for at least 20 seconds or clean your hands with an alcohol-based hand sanitizer that contains at least 60% alcohol. 8. As much as possible, stay in a specific room and away from other people in your home. Also, you should use a separate bathroom, if available. If you need to be around other people in or outside of the home, wear a mask. 9. Avoid sharing personal items with other people in your household, like dishes, towels, and bedding. 10. Clean all surfaces that are touched often, like counters, tabletops, and doorknobs. Use household cleaning sprays or wipes according to the label instructions. cdc.gov/coronavirus 07/06/2018 This information is not intended to replace advice given to you by your health care provider. Make sure you discuss any questions you have with your health care provider. Document Revised: 12/08/2018 Document Reviewed: 12/08/2018 Elsevier Patient Education  2020 Elsevier Inc. What types of side effects do monoclonal antibody drugs cause?  Common side effects  In general, the more common side effects caused by monoclonal antibody drugs include: . Allergic reactions, such as hives or itching . Flu-like signs and  symptoms, including chills, fatigue, fever, and muscle aches and pains . Nausea, vomiting . Diarrhea . Skin rashes . Low blood pressure   The CDC is recommending patients who receive monoclonal antibody treatments wait at least 90 days before being vaccinated.  Currently, there are no data on the safety and efficacy of mRNA COVID-19 vaccines in persons who received monoclonal antibodies or convalescent plasma as part of COVID-19 treatment. Based on the estimated half-life of such therapies as well as evidence suggesting that reinfection is uncommon in the 90 days after initial infection, vaccination should be deferred for at least 90 days, as a precautionary measure until additional information becomes available, to avoid interference of the antibody treatment with vaccine-induced immune responses. If you have any questions or concerns after the infusion please call the Advanced Practice Provider on call at [phone]. This number is ONLY intended for your use regarding questions or concerns about the infusion post-treatment side-effects.  Please do not provide this number to others for use. For return to work notes please contact your primary care provider.   If someone you know is interested in receiving treatment please have them call the COVID hotline at [phone].   

## 2019-12-07 NOTE — Progress Notes (Signed)
Diagnosis: COVID-19  Physician: Dr. Patrick Wright  Procedure: [facility] Infusion [facility] Med: Sotrovimab infusion - Provided patient with sotrovimab fact sheet for patients, parents, and caregivers prior to infusion.   Complications: No immediate complications noted  Discharge: Discharged home    

## 2019-12-13 ENCOUNTER — Other Ambulatory Visit: Payer: Self-pay | Admitting: Gastroenterology

## 2020-01-11 NOTE — Progress Notes (Signed)
Pt informed to get covid test Jan 17 prior to procedure on Jan 20. Pt voiced understanding. Instructions given to report to [facility] covid testing site. 

## 2020-01-22 ENCOUNTER — Other Ambulatory Visit: Payer: Self-pay

## 2020-01-24 NOTE — Progress Notes (Signed)
Patient tested positive for COVID 12/05/19, patient is within 90 day window, no retest needed before procedure this week.  Results are scanned into Epic under chart review, lab, date of 12/06/19

## 2020-01-24 NOTE — Progress Notes (Signed)
Pre call done.  Patient has no covid in labs but was positive for covid on 12/05/19.  Test results from [facility] may be view in Encounters in a [facility] televisit on 12/05/19.  Patient also received monoclonal antibody infusion here at [facility].

## 2020-01-25 ENCOUNTER — Ambulatory Visit (HOSPITAL_COMMUNITY): Payer: BC Managed Care – PPO | Admitting: Registered Nurse

## 2020-01-25 ENCOUNTER — Encounter (HOSPITAL_COMMUNITY)
Admission: RE | Disposition: A | Discharge status: Home / Self Care | Payer: Self-pay | Source: Home / Self Care | Attending: Gastroenterology

## 2020-01-25 ENCOUNTER — Other Ambulatory Visit: Payer: Self-pay

## 2020-01-25 ENCOUNTER — Encounter (HOSPITAL_COMMUNITY): Payer: Self-pay | Admitting: Gastroenterology

## 2020-01-25 ENCOUNTER — Ambulatory Visit (HOSPITAL_COMMUNITY)
Admission: RE | Admit: 2020-01-25 | Discharge: 2020-01-25 | Disposition: A | Discharge status: Home / Self Care | Payer: BC Managed Care – PPO | Attending: Gastroenterology | Admitting: Gastroenterology

## 2020-01-25 DIAGNOSIS — Z888 Allergy status to other drugs, medicaments and biological substances status: Secondary | ICD-10-CM | POA: Diagnosis not present

## 2020-01-25 DIAGNOSIS — Z79899 Other long term (current) drug therapy: Secondary | ICD-10-CM | POA: Diagnosis not present

## 2020-01-25 DIAGNOSIS — K2970 Gastritis, unspecified, without bleeding: Secondary | ICD-10-CM | POA: Insufficient documentation

## 2020-01-25 DIAGNOSIS — Z87891 Personal history of nicotine dependence: Secondary | ICD-10-CM | POA: Insufficient documentation

## 2020-01-25 DIAGNOSIS — I8510 Secondary esophageal varices without bleeding: Secondary | ICD-10-CM | POA: Insufficient documentation

## 2020-01-25 DIAGNOSIS — K7460 Unspecified cirrhosis of liver: Secondary | ICD-10-CM | POA: Diagnosis not present

## 2020-01-25 HISTORY — PX: ESOPHAGOGASTRODUODENOSCOPY (EGD) WITH PROPOFOL: SHX5813

## 2020-01-25 HISTORY — PX: ESOPHAGEAL BANDING: SHX5518

## 2020-01-25 LAB — GLUCOSE, CAPILLARY: Glucose-Capillary: 139 mg/dL — ABNORMAL HIGH (ref 70–99)

## 2020-01-25 SURGERY — ESOPHAGOGASTRODUODENOSCOPY (EGD) WITH PROPOFOL
Anesthesia: Monitor Anesthesia Care

## 2020-01-25 MED ORDER — LIDOCAINE HCL (CARDIAC) PF 100 MG/5ML IV SOSY
PREFILLED_SYRINGE | INTRAVENOUS | Status: DC | PRN
  Administered 2020-01-25: 75 mg via INTRAVENOUS

## 2020-01-25 MED ORDER — PROPOFOL 500 MG/50ML IV EMUL
INTRAVENOUS | Status: DC | PRN
  Administered 2020-01-25: 250 ug/kg/min via INTRAVENOUS

## 2020-01-25 MED ORDER — GLYCOPYRROLATE 0.2 MG/ML IJ SOLN
INTRAMUSCULAR | Status: DC | PRN
  Administered 2020-01-25: .1 mg via INTRAVENOUS

## 2020-01-25 MED ORDER — LACTATED RINGERS IV SOLN: INTRAVENOUS | Status: DC | PRN

## 2020-01-25 MED ORDER — SODIUM CHLORIDE 0.9 % IV SOLN: INTRAVENOUS | Status: DC

## 2020-01-25 MED ORDER — LACTATED RINGERS IV SOLN
INTRAVENOUS | Status: DC
  Administered 2020-01-25: 1000 mL via INTRAVENOUS

## 2020-01-25 SURGICAL SUPPLY — 14 items

## 2020-01-25 NOTE — Anesthesia Postprocedure Evaluation (Signed)
Anesthesia Post Note  Patient: Hunter Gray  Procedure(s) Performed: ESOPHAGOGASTRODUODENOSCOPY (EGD) WITH PROPOFOL with band ligation of esophageal/gastric varices (N/A )     Patient location during evaluation: PACU Anesthesia Type: MAC Level of consciousness: awake and alert Pain management: pain level controlled Vital Signs Assessment: post-procedure vital signs reviewed and stable Respiratory status: spontaneous breathing, nonlabored ventilation, respiratory function stable and patient connected to nasal cannula oxygen Cardiovascular status: stable and blood pressure returned to baseline Postop Assessment: no apparent nausea or vomiting Anesthetic complications: no   No complications documented.  Last Vitals:  Vitals:   01/25/20 0940 01/25/20 0956  BP: 101/72 117/74  Pulse: 82 73  Resp: 17 14  SpO2: 95% 95%    Last Pain:  Vitals:   01/25/20 0956  TempSrc:   PainSc: 0-No pain                 Effie Berkshire

## 2020-01-25 NOTE — Discharge Instructions (Signed)
Monitored Anesthesia Care, Care After This sheet gives you information about how to care for yourself after your procedure. Your health care provider may also give you more specific instructions. If you have problems or questions, contact your health care provider. What can I expect after the procedure? After the procedure, it is common to have:  Tiredness.  Forgetfulness about what happened after the procedure.  Impaired judgment for important decisions.  Nausea or vomiting.  Some difficulty with balance. Follow these instructions at home: For the time period you were told by your health care provider:  Rest as needed.  Do not participate in activities where you could fall or become injured.  Do not drive or use machinery.  Do not drink alcohol.  Do not take sleeping pills or medicines that cause drowsiness.  Do not make important decisions or sign legal documents.  Do not take care of children on your own.      Eating and drinking  Follow the diet that is recommended by your health care provider.  Drink enough fluid to keep your urine pale yellow.  If you vomit: ? Drink water, juice, or soup when you can drink without vomiting. ? Make sure you have little or no nausea before eating solid foods. General instructions  Have a responsible adult stay with you for the time you are told. It is important to have someone help care for you until you are awake and alert.  Take over-the-counter and prescription medicines only as told by your health care provider.  If you have sleep apnea, surgery and certain medicines can increase your risk for breathing problems. Follow instructions from your health care provider about wearing your sleep device: ? Anytime you are sleeping, including during daytime naps. ? While taking prescription pain medicines, sleeping medicines, or medicines that make you drowsy.  Avoid smoking.  Keep all follow-up visits as told by your health care  provider. This is important. Contact a health care provider if:  You keep feeling nauseous or you keep vomiting.  You feel light-headed.  You are still sleepy or having trouble with balance after 24 hours.  You develop a rash.  You have a fever.  You have redness or swelling around the IV site. Get help right away if:  You have trouble breathing.  You have new-onset confusion at home. Summary  For several hours after your procedure, you may feel tired. You may also be forgetful and have poor judgment.  Have a responsible adult stay with you for the time you are told. It is important to have someone help care for you until you are awake and alert.  Rest as told. Do not drive or operate machinery. Do not drink alcohol or take sleeping pills.  Get help right away if you have trouble breathing, or if you suddenly become confused. This information is not intended to replace advice given to you by your health care provider. Make sure you discuss any questions you have with your health care provider. Document Revised: 09/07/2019 Document Reviewed: 11/24/2018 Elsevier Patient Education  2021 Elsevier Inc. Upper Endoscopy, Adult, Care After This sheet gives you information about how to care for yourself after your procedure. Your health care provider may also give you more specific instructions. If you have problems or questions, contact your health care provider. What can I expect after the procedure? After the procedure, it is common to have:  A sore throat.  Mild stomach pain or discomfort.  Bloating.  Nausea.   Follow these instructions at home:  Follow instructions from your health care provider about what to eat or drink after your procedure.  Return to your normal activities as told by your health care provider. Ask your health care provider what activities are safe for you.  Take over-the-counter and prescription medicines only as told by your health care  provider.  If you were given a sedative during the procedure, it can affect you for several hours. Do not drive or operate machinery until your health care provider says that it is safe.  Keep all follow-up visits as told by your health care provider. This is important.   Contact a health care provider if you have:  A sore throat that lasts longer than one day.  Trouble swallowing. Get help right away if:  You vomit blood or your vomit looks like coffee grounds.  You have: ? A fever. ? Bloody, black, or tarry stools. ? A severe sore throat or you cannot swallow. ? Difficulty breathing. ? Severe pain in your chest or abdomen. Summary  After the procedure, it is common to have a sore throat, mild stomach discomfort, bloating, and nausea.  If you were given a sedative during the procedure, it can affect you for several hours. Do not drive or operate machinery until your health care provider says that it is safe.  Follow instructions from your health care provider about what to eat or drink after your procedure.  Return to your normal activities as told by your health care provider. This information is not intended to replace advice given to you by your health care provider. Make sure you discuss any questions you have with your health care provider. Document Revised: 12/20/2018 Document Reviewed: 05/24/2017 Elsevier Patient Education  2021 Elsevier Inc. Esophageal Variceal Ligation, Care After After an esophageal variceal ligation (EVL), it is common to have bleeding, pain and soreness in the chest. You may also have problems swallowing. Follow these instructions at home: Your doctor may give you more specific instructions. If you have problems, call your doctor. Eating and drinking You will have limits on what you can eat for the first 1-2 days after your procedure.  You will start with a liquid diet. Later, you will start to eat soft foods.  Do not drink alcohol.  You may  return to your normal diet after 2 days, if you no longer have trouble swallowing.   Activity  Do not lift anything that is heavier than 10 lb (4.5 kg), or the limit that you are told.  Do not drive or use heavy machines while taking prescription pain medicine.  Return to your normal activities when your doctor says that it is okay.  Avoid physical activity that takes a lot of effort. Ask your health care provider what exercises are safe for you. General instructions  Take over-the-counter and prescription medicines as you are told.  Take your antibiotic medicine as told by your doctor. Do not stop taking them even if you start to feel better.  Do not smoke cigarettes or e-cigarettes. Do not chew tobacco. Ask your doctor if you need help.  Keep all follow-up visits.   Contact a doctor if:  You have chest pain that lasts for more than 3 days after you go home.  You have trouble swallowing that lasts for more than 3 days after you go home.  You have fever or chills. Get help right away if:  You have bleeding from your throat.  You have   bleeding from your bottom (rectum).  You throw up (vomit) bright red blood.  You are unable to swallow.  You are short of breath.  You have very bad chest pain or back pain. These symptoms may be an emergency. Get help right away. Call 911.  Do not wait to see if the symptoms will go away.  Do not drive yourself to the hospital. Summary  After an EVL procedure, it is common to have pain, bleeding, and trouble swallowing.  Follow your doctor's home care instructions.  Stay on a liquid or soft diet until your doctor says that you can go back to your normal diet.  Contact a doctor if you have chills, fever, chest pain, or trouble swallowing.  Get help right away if you have bleeding, are unable to swallow, or have very bad chest or back pain. This information is not intended to replace advice given to you by your health care provider.  Make sure you discuss any questions you have with your health care provider. Document Revised: 04/11/2019 Document Reviewed: 04/11/2019 Elsevier Patient Education  2021 Elsevier Inc.  

## 2020-01-25 NOTE — Anesthesia Preprocedure Evaluation (Addendum)
Anesthesia Evaluation  Patient identified by MRN, date of birth, ID band Patient awake    Reviewed: Allergy & Precautions, NPO status , Patient's Chart, lab work & pertinent test results  Airway Mallampati: III  TM Distance: >3 FB Neck ROM: Full    Dental  (+) Teeth Intact, Dental Advisory Given   Pulmonary former smoker,    Pulmonary exam normal        Cardiovascular negative cardio ROS   Rhythm:Regular Rate:Normal     Neuro/Psych negative neurological ROS  negative psych ROS   GI/Hepatic negative GI ROS, (+) Cirrhosis   Esophageal Varices    ,   Endo/Other  diabetes  Renal/GU negative Renal ROS     Musculoskeletal negative musculoskeletal ROS (+)   Abdominal Normal abdominal exam  (+)   Peds  Hematology negative hematology ROS (+)   Anesthesia Other Findings   Reproductive/Obstetrics                            Anesthesia Physical Anesthesia Plan  ASA: III  Anesthesia Plan: MAC   Post-op Pain Management:    Induction: Intravenous  PONV Risk Score and Plan: 0 and Propofol infusion  Airway Management Planned: Simple Face Mask and Natural Airway  Additional Equipment:   Intra-op Plan:   Post-operative Plan:   Informed Consent: I have reviewed the patients History and Physical, chart, labs and discussed the procedure including the risks, benefits and alternatives for the proposed anesthesia with the patient or authorized representative who has indicated his/her understanding and acceptance.       Plan Discussed with: CRNA  Anesthesia Plan Comments:        Anesthesia Quick Evaluation

## 2020-01-25 NOTE — H&P (Signed)
Primary Care Physician:  Larkin Ina Primary Gastroenterologist:  Dr.Jae Bruck  Reason for Visit : EGD for variceal band ligation  HPI: Hunter Gray is a 50 y.o. male with past medical history of cirrhosis complicated by esophageal varices presented to [facility] for outpatient EGD.  Last EGD in Nov  2021 showed large esophageal varices and 3 bands were placed.  Patient denies any acute issues since last visit.  Denies any abdominal pain, nausea or vomiting.  Denies blood in the stool or black stool.  Denies confusion.  Overall doing well from GI standpoint.  Currently on nadolol.  Past Medical History:  Diagnosis Date  . Cirrhosis (HCC)   . Cirrhosis of liver (HCC)   . Diabetes mellitus without complication (HCC)   . Esophageal varices (HCC)   . Vision abnormalities     Past Surgical History:  Procedure Laterality Date  . ESOPHAGEAL BANDING  05/14/2019   Procedure: ESOPHAGEAL BANDING;  Surgeon: Kerin Salen, MD;  Location: [facility];  Service: Gastroenterology;;  . ESOPHAGEAL BANDING  08/23/2019   Procedure: ESOPHAGEAL BANDING;  Surgeon: Kerin Salen, MD;  Location: [facility];  Service: Gastroenterology;;  . ESOPHAGEAL BANDING N/A 11/16/2019   Procedure: ESOPHAGEAL BANDING;  Surgeon: Kathi Der, MD;  Location: [facility];  Service: Gastroenterology;  Laterality: N/A;  . ESOPHAGOGASTRODUODENOSCOPY (EGD) WITH PROPOFOL N/A 05/14/2019   Procedure: ESOPHAGOGASTRODUODENOSCOPY (EGD) WITH PROPOFOL;  Surgeon: Kerin Salen, MD;  Location: [facility];  Service: Gastroenterology;  Laterality: N/A;  . ESOPHAGOGASTRODUODENOSCOPY (EGD) WITH PROPOFOL N/A 08/23/2019   Procedure: ESOPHAGOGASTRODUODENOSCOPY (EGD) WITH PROPOFOL;  Surgeon: Kerin Salen, MD;  Location: [facility];  Service: Gastroenterology;  Laterality: N/A;  . ESOPHAGOGASTRODUODENOSCOPY (EGD) WITH PROPOFOL N/A 09/15/2019   Procedure: ESOPHAGOGASTRODUODENOSCOPY (EGD) WITH PROPOFOL;  Surgeon: Kathi Der, MD;  Location: [facility];  Service: Gastroenterology;  Laterality: N/A;  . ESOPHAGOGASTRODUODENOSCOPY (EGD) WITH PROPOFOL N/A 11/16/2019   Procedure: ESOPHAGOGASTRODUODENOSCOPY (EGD) WITH PROPOFOL;  Surgeon: Kathi Der, MD;  Location: [facility];  Service: Gastroenterology;  Laterality: N/A;  . GASTRIC VARICES BANDING N/A 09/15/2019   Procedure: GASTRIC VARICES BANDING;  Surgeon: Kathi Der, MD;  Location: [facility];  Service: Gastroenterology;  Laterality: N/A;  . SHOULDER ARTHROSCOPY Left age 62    Prior to Admission medications   Medication Sig Start Date End Date Taking? Authorizing Provider  fluticasone (FLONASE) 50 MCG/ACT nasal spray Place 1 spray into both nostrils 2 (two) times daily as needed for allergies or rhinitis (nasal congestion).    Yes [provider]  gabapentin (NEURONTIN) 600 MG tablet Take 600-1,200 mg by mouth See admin instructions. Take 1 tablet (600 mg) by mouth in the morning, take 1 tablet (600 mg) by mouth at lunch, & take 2 tablets (1200 mg) by mouth at bedtime. 03/30/19  Yes [provider]  nadolol (CORGARD) 20 MG tablet Take 0.5 tablets (10 mg total) by mouth daily. Patient taking differently: Take 20 mg by mouth daily.  05/19/19 09/07/20 Yes Kyle, Tyrone A, DO  SYNJARDY XR 05-998 MG TB24 Take 2 tablets by mouth daily with breakfast.  04/28/19  Yes [provider]  VICTOZA 18 MG/3ML SOPN Inject 1.8 mg into the skin daily.  11/18/17  Yes [provider]  pantoprazole (PROTONIX) 40 MG tablet Take 1 tablet (40 mg total) by mouth 2 (two) times daily. Patient not taking: Reported on 11/15/2019 05/19/19 09/07/20  Margie Ege A, DO    Scheduled Meds: Continuous Infusions: . sodium chloride    . lactated ringers  1,000 mL (01/25/20 0803)   PRN Meds:.  Allergies as of 12/13/2019 - Review Complete 12/07/2019  Allergen Reaction Noted  . Glimepiride Other (See Comments) 05/24/2019    Family History  Problem  Relation Age of Onset  . Diabetes type II Mother   . Heart disease Mother   . Diabetes type II Father   . Diabetes type II Sister     Social History   Socioeconomic History  . Marital status: Married    Spouse name: Not on file  . Number of children: Not on file  . Years of education: Not on file  . Highest education level: Not on file  Occupational History  . Not on file  Tobacco Use  . Smoking status: Former Games developer  . Smokeless tobacco: Never Used  Vaping Use  . Vaping Use: Never used  Substance and Sexual Activity  . Alcohol use: Not Currently  . Drug use: Not Currently  . Sexual activity: Not on file  Other Topics Concern  . Not on file  Social History Narrative   ** Merged History Encounter **       Social Determinants of Health   Financial Resource Strain: Not on file  Food Insecurity: Not on file  Transportation Needs: Not on file  Physical Activity: Not on file  Stress: Not on file  Social Connections: Not on file  Intimate Partner Violence: Not on file    Review of Systems: All negative except as stated above in HPI.  Physical Exam: Vital signs: Vitals:   01/25/20 0752  BP: 121/67  Resp: 20  SpO2: 97%     General:   Alert,  Well-developed, well-nourished, pleasant and cooperative in NAD Lungs:  Clear throughout to auscultation.   No wheezes, crackles, or rhonchi. No acute distress. Heart:  Regular rate and rhythm; no murmurs, clicks, rubs,  or gallops. Abdomen: Soft, nontender, nondistended, bowel sounds present.  No peritoneal signs Rectal:  Deferred  GI:  Lab Results: No results for input(s): WBC, HGB, HCT, PLT in the last 72 hours. BMET No results for input(s): NA, K, CL, CO2, GLUCOSE, BUN, CREATININE, CALCIUM in the last 72 hours. LFT No results for input(s): PROT, ALBUMIN, AST, ALT, ALKPHOS, BILITOT, BILIDIR, IBILI in the last 72 hours. PT/INR No results for input(s): LABPROT, INR in the last 72 hours.   Studies/Results: No results  found.  Impression/Plan: -Cirrhosis of the liver complicated by esophageal varices requiring band ligation in the past.  Currently on nadolol.  Recommendations ------------------------- -Proceed with EGD with possible band ligation.  Risks (bleeding, infection, bowel perforation that could require surgery, sedation-related changes in cardiopulmonary systems), benefits (identification and possible treatment of source of symptoms, exclusion of certain causes of symptoms), and alternatives (watchful waiting, radiographic imaging studies, empiric medical treatment)  were explained to patientin detail and patient wishes to proceed.    LOS: 0 days   Kathi Der  MD, FACP 01/25/2020, 8:46 AM  Contact #  [phone]

## 2020-01-25 NOTE — Op Note (Signed)
[facility] Patient Name: Hunter Gray Procedure Date: 01/25/2020 MRN: [id number] Attending MD: Kathi Der , MD Date of Birth: 1970-12-04 CSN: [id number] Age: 50 Admit Type: Outpatient Procedure:                Upper GI endoscopy Indications:              For therapy of esophageal varices Providers:                Kathi Der, MD, Fayrene Fearing, RN, Melany Guernsey, Technician Referring MD:              Medicines:                Sedation Administered by an Anesthesia Professional Complications:            No immediate complications. Estimated Blood Loss:     Estimated blood loss was minimal. Procedure:                Pre-Anesthesia Assessment:                           - Prior to the procedure, a History and Physical                            was performed, and patient medications and                            allergies were reviewed. The patient's tolerance of                            previous anesthesia was also reviewed. The risks                            and benefits of the procedure and the sedation                            options and risks were discussed with the patient.                            All questions were answered, and informed consent                            was obtained. Prior Anticoagulants: The patient has                            taken no previous anticoagulant or antiplatelet                            agents. ASA Grade Assessment: III - A patient with                            severe systemic disease. After reviewing the risks  and benefits, the patient was deemed in                            satisfactory condition to undergo the procedure.                           After obtaining informed consent, the endoscope was                            passed under direct vision. Throughout the                            procedure, the patient's blood pressure, pulse,  and                            oxygen saturations were monitored continuously. The                            GIF-H190 ([id number]) Olympus gastroscope was                            introduced through the mouth, and advanced to the                            second part of duodenum. The upper GI endoscopy was                            accomplished without difficulty. The patient                            tolerated the procedure well. Scope In: Scope Out: Findings:      One column of large (> 5 mm) varices with no bleeding and no stigmata of       recent bleeding were found in the distal esophagus,. No red wale signs       were present. Scarring from prior treatment was visible. The varices       appeared smaller than they were at prior exam. Two bands were       successfully placed with complete eradication, resulting in deflation of       varices. There was no bleeding at the end of the maneuver.      Scattered mild inflammation characterized by congestion (edema) and       erythema was found in the prepyloric region of the stomach.      The cardia and gastric fundus were normal on retroflexion.      The duodenal bulb, first portion of the duodenum and second portion of       the duodenum were normal. Impression:               - Large (> 5 mm) esophageal varices with no                            bleeding and no stigmata of recent bleeding.  Completely eradicated. Banded.                           - Gastritis.                           - Normal duodenal bulb, first portion of the                            duodenum and second portion of the duodenum.                           - No specimens collected. Moderate Sedation:      Moderate (conscious) sedation was personally administered by an       anesthesia professional. The following parameters were monitored: oxygen       saturation, heart rate, blood pressure, and response to care. Recommendation:            - Patient has a contact number available for                            emergencies. The signs and symptoms of potential                            delayed complications were discussed with the                            patient. Return to normal activities tomorrow.                            Written discharge instructions were provided to the                            patient.                           - Resume previous diet.                           - Continue present medications.                           - Await pathology results.                           - Repeat upper endoscopy in 3 months for                            retreatment.                           - Return to my office in 2 months. Procedure Code(s):        --- Professional ---                           [phone], Esophagogastroduodenoscopy, flexible,  transoral; with band ligation of esophageal/gastric                            varices Diagnosis Code(s):        --- Professional ---                           I85.00, Esophageal varices without bleeding                           K29.70, Gastritis, unspecified, without bleeding CPT copyright 2019 American Medical Association. All rights reserved. The codes documented in this report are preliminary and upon coder review may  be revised to meet current compliance requirements. Kathi Der, MD Kathi Der, MD 01/25/2020 9:23:04 AM Number of Addenda: 0

## 2020-01-25 NOTE — Transfer of Care (Signed)
Immediate Anesthesia Transfer of Care Note  Patient: Hunter Gray  Procedure(s) Performed: ESOPHAGOGASTRODUODENOSCOPY (EGD) WITH PROPOFOL with band ligation of esophageal/gastric varices (N/A )  Patient Location: PACU  Anesthesia Type:MAC  Level of Consciousness: sedated and patient cooperative  Airway & Oxygen Therapy: Patient Spontanous Breathing and Patient connected to face mask oxygen  Post-op Assessment: Report given to RN, Post -op Vital signs reviewed and stable and Patient moving all extremities X 4  Post vital signs: stable  Last Vitals:  Vitals Value Taken Time  BP    Temp    Pulse    Resp    SpO2      Last Pain:  Vitals:   01/25/20 0752  TempSrc: Oral  PainSc: 0-No pain         Complications: No complications documented.

## 2020-01-25 NOTE — Anesthesia Procedure Notes (Signed)
Procedure Name: MAC Date/Time: 01/25/2020 9:01 AM Performed by: Lissa Morales, CRNA Pre-anesthesia Checklist: Patient identified, Suction available, Patient being monitored, Timeout performed and Emergency Drugs available Patient Re-evaluated:Patient Re-evaluated prior to induction Oxygen Delivery Method: Simple face mask Placement Confirmation: positive ETCO2

## 2020-01-27 ENCOUNTER — Encounter (HOSPITAL_COMMUNITY): Payer: Self-pay | Admitting: Gastroenterology

## 2020-02-02 ENCOUNTER — Other Ambulatory Visit: Payer: Self-pay | Admitting: Gastroenterology

## 2020-03-05 ENCOUNTER — Other Ambulatory Visit: Payer: Self-pay | Admitting: Gastroenterology

## 2020-03-05 DIAGNOSIS — K7460 Unspecified cirrhosis of liver: Secondary | ICD-10-CM

## 2020-05-28 ENCOUNTER — Ambulatory Visit (HOSPITAL_COMMUNITY)
Admission: RE | Admit: 2020-05-28 | Payer: BC Managed Care – PPO | Source: Home / Self Care | Admitting: Gastroenterology

## 2020-05-28 ENCOUNTER — Encounter (HOSPITAL_COMMUNITY): Admission: RE | Payer: Self-pay | Source: Home / Self Care

## 2020-05-28 SURGERY — ESOPHAGOGASTRODUODENOSCOPY (EGD) WITH PROPOFOL
Anesthesia: Monitor Anesthesia Care

## 2020-09-11 NOTE — Progress Notes (Addendum)
Attempted to obtain medical history via telephone, unable to reach at this time. Voicemail full

## 2020-09-17 ENCOUNTER — Ambulatory Visit (HOSPITAL_COMMUNITY): Payer: BC Managed Care – PPO | Admitting: Certified Registered Nurse Anesthetist

## 2020-09-17 ENCOUNTER — Encounter (HOSPITAL_COMMUNITY)
Admission: RE | Disposition: A | Discharge status: Home / Self Care | Payer: Self-pay | Source: Home / Self Care | Attending: Gastroenterology

## 2020-09-17 ENCOUNTER — Other Ambulatory Visit: Payer: Self-pay

## 2020-09-17 ENCOUNTER — Ambulatory Visit (HOSPITAL_COMMUNITY)
Admission: RE | Admit: 2020-09-17 | Discharge: 2020-09-17 | Disposition: A | Discharge status: Home / Self Care | Payer: BC Managed Care – PPO | Attending: Gastroenterology | Admitting: Gastroenterology

## 2020-09-17 ENCOUNTER — Encounter (HOSPITAL_COMMUNITY): Payer: Self-pay | Admitting: Gastroenterology

## 2020-09-17 DIAGNOSIS — Z8249 Family history of ischemic heart disease and other diseases of the circulatory system: Secondary | ICD-10-CM | POA: Diagnosis not present

## 2020-09-17 DIAGNOSIS — Z79899 Other long term (current) drug therapy: Secondary | ICD-10-CM | POA: Insufficient documentation

## 2020-09-17 DIAGNOSIS — K2970 Gastritis, unspecified, without bleeding: Secondary | ICD-10-CM | POA: Diagnosis not present

## 2020-09-17 DIAGNOSIS — Z87891 Personal history of nicotine dependence: Secondary | ICD-10-CM | POA: Diagnosis not present

## 2020-09-17 DIAGNOSIS — K7460 Unspecified cirrhosis of liver: Secondary | ICD-10-CM | POA: Diagnosis not present

## 2020-09-17 DIAGNOSIS — E119 Type 2 diabetes mellitus without complications: Secondary | ICD-10-CM | POA: Insufficient documentation

## 2020-09-17 DIAGNOSIS — Z888 Allergy status to other drugs, medicaments and biological substances status: Secondary | ICD-10-CM | POA: Insufficient documentation

## 2020-09-17 DIAGNOSIS — I8510 Secondary esophageal varices without bleeding: Secondary | ICD-10-CM | POA: Diagnosis not present

## 2020-09-17 DIAGNOSIS — Z833 Family history of diabetes mellitus: Secondary | ICD-10-CM | POA: Diagnosis not present

## 2020-09-17 DIAGNOSIS — I8500 Esophageal varices without bleeding: Secondary | ICD-10-CM | POA: Diagnosis present

## 2020-09-17 HISTORY — PX: ESOPHAGEAL BANDING: SHX5518

## 2020-09-17 HISTORY — PX: ESOPHAGOGASTRODUODENOSCOPY (EGD) WITH PROPOFOL: SHX5813

## 2020-09-17 SURGERY — ESOPHAGOGASTRODUODENOSCOPY (EGD) WITH PROPOFOL
Anesthesia: Monitor Anesthesia Care

## 2020-09-17 MED ORDER — LACTATED RINGERS IV SOLN: INTRAVENOUS | Status: DC

## 2020-09-17 MED ORDER — PROPOFOL 10 MG/ML IV BOLUS
INTRAVENOUS | Status: DC | PRN
  Administered 2020-09-17: 50 mg via INTRAVENOUS
  Administered 2020-09-17: 20 mg via INTRAVENOUS
  Administered 2020-09-17: 40 mg via INTRAVENOUS
  Administered 2020-09-17 (×2): 20 mg via INTRAVENOUS
  Administered 2020-09-17: 10 mg via INTRAVENOUS

## 2020-09-17 MED ORDER — PROPOFOL 500 MG/50ML IV EMUL
INTRAVENOUS | Status: DC | PRN
  Administered 2020-09-17: 150 ug/kg/min via INTRAVENOUS

## 2020-09-17 MED ORDER — SODIUM CHLORIDE 0.9 % IV SOLN: INTRAVENOUS | Status: DC

## 2020-09-17 SURGICAL SUPPLY — 14 items

## 2020-09-17 NOTE — Discharge Instructions (Signed)

## 2020-09-17 NOTE — Op Note (Signed)
[facility] Patient Name: Hunter Gray Procedure Date: 09/17/2020 MRN: [id number] Attending MD: Kathi Der , MD Date of Birth: 07-03-1970 CSN: [id number] Age: 50 Admit Type: Outpatient Procedure:                Upper GI endoscopy Indications:              For therapy of esophageal varices Providers:                Kathi Der, MD, Fransisca Connors, Lawson Radar, Technician, Helayne Semin[place] Referring MD:              Medicines:                Sedation Administered by an Anesthesia Professional Complications:            No immediate complications. Estimated Blood Loss:     Estimated blood loss was minimal. Procedure:                Pre-Anesthesia Assessment:                           - Prior to the procedure, a History and Physical                            was performed, and patient medications and                            allergies were reviewed. The patient's tolerance of                            previous anesthesia was also reviewed. The risks                            and benefits of the procedure and the sedation                            options and risks were discussed with the patient.                            All questions were answered, and informed consent                            was obtained. Prior Anticoagulants: The patient has                            taken no previous anticoagulant or antiplatelet                            agents. ASA Grade Assessment: III - A patient with                            severe systemic disease. After reviewing the risks  and benefits, the patient was deemed in                            satisfactory condition to undergo the procedure.                           After obtaining informed consent, the endoscope was                            passed under direct vision. Throughout the                            procedure, the patient's blood  pressure, pulse, and                            oxygen saturations were monitored continuously. The                            GIF-H190 ([id number]) Olympus endoscope was introduced                            through the mouth, and advanced to the second part                            of duodenum. The upper GI endoscopy was                            accomplished without difficulty. The patient                            tolerated the procedure well. Scope In: Scope Out: Findings:      Three columns of large (> 5 mm) varices with no bleeding and no stigmata       of recent bleeding were found in the mid esophagus and in the distal       esophagus,. Red wale signs were present. Stigmata of prior treatment       were evident. Four bands were successfully placed with complete       eradication, resulting in deflation of varices. There was no bleeding at       the end of the maneuver. Initial Band ligator equipment had some       malfunction and total 2 Band-ligator were used and 4 bands were placed.      Scattered minimal inflammation characterized by erythema was found in       the entire examined stomach.      The cardia and gastric fundus were normal on retroflexion.      The duodenal bulb, first portion of the duodenum and second portion of       the duodenum were normal. Impression:               - Large (> 5 mm) esophageal varices with no                            bleeding and no stigmata of recent bleeding.  Completely eradicated. Banded.                           - Gastritis.                           - Normal duodenal bulb, first portion of the                            duodenum and second portion of the duodenum.                           - No specimens collected. Moderate Sedation:      Moderate (conscious) sedation was personally administered by an       anesthesia professional. The following parameters were monitored: oxygen       saturation, heart  rate, blood pressure, and response to care. Recommendation:           - Patient has a contact number available for                            emergencies. The signs and symptoms of potential                            delayed complications were discussed with the                            patient. Return to normal activities tomorrow.                            Written discharge instructions were provided to the                            patient.                           - Resume previous diet.                           - Continue present medications.                           - Repeat upper endoscopy in 2 months for                            retreatment.                           - Return to GI office in 3 months. Procedure Code(s):        --- Professional ---                           [phone], Esophagogastroduodenoscopy, flexible,                            transoral; with band ligation of esophageal/gastric  varices Diagnosis Code(s):        --- Professional ---                           I85.00, Esophageal varices without bleeding                           K29.70, Gastritis, unspecified, without bleeding CPT copyright 2019 American Medical Association. All rights reserved. The codes documented in this report are preliminary and upon coder review may  be revised to meet current compliance requirements. Kathi Der, MD Kathi Der, MD 09/17/2020 9:20:12 AM Number of Addenda: 0

## 2020-09-17 NOTE — Transfer of Care (Signed)
Immediate Anesthesia Transfer of Care Note  Patient: Hunter Gray  Procedure(s) Performed: ESOPHAGOGASTRODUODENOSCOPY (EGD) WITH PROPOFOL ESOPHAGEAL BANDING  Patient Location: Endoscopy Unit  Anesthesia Type:MAC  Level of Consciousness: drowsy  Airway & Oxygen Therapy: Patient Spontanous Breathing and Patient connected to face mask oxygen  Post-op Assessment: Report given to RN and Post -op Vital signs reviewed and stable  Post vital signs: Reviewed and stable  Last Vitals:  Vitals Value Taken Time  BP 105/56 09/17/20 0918  Temp    Pulse 65 09/17/20 0920  Resp 10 09/17/20 0920  SpO2 99 % 09/17/20 0920  Vitals shown include unvalidated device data.  Last Pain:  Vitals:   09/17/20 0824  TempSrc: Oral  PainSc: 0-No pain         Complications: No notable events documented.

## 2020-09-17 NOTE — Anesthesia Preprocedure Evaluation (Signed)
Anesthesia Evaluation  Patient identified by MRN, date of birth, ID band Patient awake    Reviewed: Allergy & Precautions, NPO status , Patient's Chart, lab work & pertinent test results  History of Anesthesia Complications Negative for: history of anesthetic complications  Airway Mallampati: III  TM Distance: >3 FB Neck ROM: Full    Dental  (+) Teeth Intact, Dental Advisory Given   Pulmonary neg shortness of breath, neg sleep apnea, neg COPD, neg recent URI, former smoker,    Pulmonary exam normal        Cardiovascular negative cardio ROS   Rhythm:Regular Rate:Normal     Neuro/Psych negative neurological ROS  negative psych ROS   GI/Hepatic negative GI ROS, (+) Cirrhosis   Esophageal Varices    ,   Endo/Other  diabetes  Renal/GU negative Renal ROS     Musculoskeletal negative musculoskeletal ROS (+)   Abdominal Normal abdominal exam  (+)   Peds  Hematology  (+) Blood dyscrasia, anemia , Lab Results      Component                Value               Date                      WBC                      4.7                 05/19/2019                HGB                      9.7 (L)             05/19/2019                HCT                      29.1 (L)            05/19/2019                MCV                      99.3                05/19/2019                PLT                      121 (L)             05/19/2019              Anesthesia Other Findings   Reproductive/Obstetrics                             Anesthesia Physical Anesthesia Plan  ASA: 3  Anesthesia Plan: MAC   Post-op Pain Management:    Induction: Intravenous  PONV Risk Score and Plan: 1 and Treatment may vary due to age or medical condition and Propofol infusion  Airway Management Planned: Nasal Cannula  Additional Equipment: None  Intra-op Plan:   Post-operative Plan:   Informed Consent: I have reviewed the  patients History and Physical, chart, labs and  discussed the procedure including the risks, benefits and alternatives for the proposed anesthesia with the patient or authorized representative who has indicated his/her understanding and acceptance.     Dental advisory given  Plan Discussed with: CRNA and Anesthesiologist  Anesthesia Plan Comments:         Anesthesia Quick Evaluation

## 2020-09-17 NOTE — H&P (Signed)
Primary Care Physician:  Larkin Ina Primary Gastroenterologist:  Dr.Ranita Stjulien  Reason for Visit : EGD for variceal band ligation  HPI: Hunter Gray is a 50 y.o. male with past medical history of cirrhosis complicated by esophageal varices presented to [facility] for outpatient EGD.  Last EGD in Jan 2022 showed large esophageal varices and 2 bands were placed.  Patient denies any acute issues since last visit.  Denies any abdominal pain, nausea or vomiting.  Denies blood in the stool or black stool.  Denies confusion.  Overall doing well from GI standpoint.  Currently on nadolol.  Past Medical History:  Diagnosis Date   Cirrhosis (HCC)    Cirrhosis of liver (HCC)    Diabetes mellitus without complication (HCC)    Esophageal varices (HCC)    Vision abnormalities     Past Surgical History:  Procedure Laterality Date   ESOPHAGEAL BANDING  05/14/2019   Procedure: ESOPHAGEAL BANDING;  Surgeon: Kerin Salen, MD;  Location: [facility];  Service: Gastroenterology;;   ESOPHAGEAL BANDING  08/23/2019   Procedure: ESOPHAGEAL BANDING;  Surgeon: Kerin Salen, MD;  Location: [facility];  Service: Gastroenterology;;   ESOPHAGEAL BANDING N/A 11/16/2019   Procedure: ESOPHAGEAL BANDING;  Surgeon: Kathi Der, MD;  Location: [facility];  Service: Gastroenterology;  Laterality: N/A;   ESOPHAGEAL BANDING  01/25/2020   Procedure: ESOPHAGEAL BANDING;  Surgeon: Kathi Der, MD;  Location: [facility];  Service: Gastroenterology;;   ESOPHAGOGASTRODUODENOSCOPY (EGD) WITH PROPOFOL N/A 05/14/2019   Procedure: ESOPHAGOGASTRODUODENOSCOPY (EGD) WITH PROPOFOL;  Surgeon: Kerin Salen, MD;  Location: [facility];  Service: Gastroenterology;  Laterality: N/A;   ESOPHAGOGASTRODUODENOSCOPY (EGD) WITH PROPOFOL N/A 08/23/2019   Procedure: ESOPHAGOGASTRODUODENOSCOPY (EGD) WITH PROPOFOL;  Surgeon: Kerin Salen, MD;  Location: [facility];  Service: Gastroenterology;  Laterality: N/A;    ESOPHAGOGASTRODUODENOSCOPY (EGD) WITH PROPOFOL N/A 09/15/2019   Procedure: ESOPHAGOGASTRODUODENOSCOPY (EGD) WITH PROPOFOL;  Surgeon: Kathi Der, MD;  Location: [facility];  Service: Gastroenterology;  Laterality: N/A;   ESOPHAGOGASTRODUODENOSCOPY (EGD) WITH PROPOFOL N/A 11/16/2019   Procedure: ESOPHAGOGASTRODUODENOSCOPY (EGD) WITH PROPOFOL;  Surgeon: Kathi Der, MD;  Location: [facility];  Service: Gastroenterology;  Laterality: N/A;   ESOPHAGOGASTRODUODENOSCOPY (EGD) WITH PROPOFOL N/A 01/25/2020   Procedure: ESOPHAGOGASTRODUODENOSCOPY (EGD) WITH PROPOFOL;  Surgeon: Kathi Der, MD;  Location: [facility];  Service: Gastroenterology;  Laterality: N/A;   GASTRIC VARICES BANDING N/A 09/15/2019   Procedure: GASTRIC VARICES BANDING;  Surgeon: Kathi Der, MD;  Location: [facility];  Service: Gastroenterology;  Laterality: N/A;   SHOULDER ARTHROSCOPY Left age 4    Prior to Admission medications   Medication Sig Start Date End Date Taking? Authorizing Provider  fluticasone (FLONASE) 50 MCG/ACT nasal spray Place 1 spray into both nostrils 2 (two) times daily as needed for allergies or rhinitis (nasal congestion).    Yes [provider]  gabapentin (NEURONTIN) 600 MG tablet Take 600-1,200 mg by mouth See admin instructions. Take 1 tablet (600 mg) by mouth in the morning, take 1 tablet (600 mg) by mouth at lunch, & take 2 tablets (1200 mg) by mouth at bedtime. 03/30/19  Yes [provider]  nadolol (CORGARD) 20 MG tablet Take 0.5 tablets (10 mg total) by mouth daily. Patient taking differently: Take 20 mg by mouth daily.  05/19/19 09/07/20 Yes Kyle, Tyrone A, DO  SYNJARDY XR 05-998 MG TB24 Take 2 tablets by mouth daily with breakfast.  04/28/19  Yes [provider]  VICTOZA 18 MG/3ML SOPN Inject 1.8 mg into the skin daily.  11/18/17  Yes [provider]  pantoprazole (PROTONIX) 40 MG tablet Take 1 tablet (40 mg total) by mouth 2 (two) times  daily. Patient not taking: Reported on 11/15/2019 05/19/19 09/07/20  Margie Ege A, DO    Scheduled Meds: Continuous Infusions:  sodium chloride     lactated ringers     PRN Meds:.  Allergies as of 06/12/2020 - Review Complete 01/25/2020  Allergen Reaction Noted   Glimepiride Other (See Comments) 05/24/2019    Family History  Problem Relation Age of Onset   Diabetes type II Mother    Heart disease Mother    Diabetes type II Father    Diabetes type II Sister     Social History   Socioeconomic History   Marital status: Married    Spouse name: Not on file   Number of children: Not on file   Years of education: Not on file   Highest education level: Not on file  Occupational History   Not on file  Tobacco Use   Smoking status: Former   Smokeless tobacco: Never  Vaping Use   Vaping Use: Never used  Substance and Sexual Activity   Alcohol use: Not Currently   Drug use: Not Currently   Sexual activity: Not on file  Other Topics Concern   Not on file  Social History Narrative   ** Merged History Encounter **       Social Determinants of Health   Financial Resource Strain: Not on file  Food Insecurity: Not on file  Transportation Needs: Not on file  Physical Activity: Not on file  Stress: Not on file  Social Connections: Not on file  Intimate Partner Violence: Not on file    Review of Systems: All negative except as stated above in HPI.  Physical Exam: Vital signs: Vitals:   09/17/20 0824  BP: 137/71  Pulse: 60  Resp: 16  Temp: 98.5 F (36.9 C)  SpO2: 95%     General:   Alert,  Well-developed, well-nourished, pleasant and cooperative in NAD Lungs:  Clear throughout to auscultation.   No wheezes, crackles, or rhonchi. No acute distress. Heart:  Regular rate and rhythm; no murmurs, clicks, rubs,  or gallops. Abdomen: Soft, nontender, nondistended, bowel sounds present.  No peritoneal signs Rectal:  Deferred  GI:  Lab Results: No results for  input(s): WBC, HGB, HCT, PLT in the last 72 hours. BMET No results for input(s): NA, K, CL, CO2, GLUCOSE, BUN, CREATININE, CALCIUM in the last 72 hours. LFT No results for input(s): PROT, ALBUMIN, AST, ALT, ALKPHOS, BILITOT, BILIDIR, IBILI in the last 72 hours. PT/INR No results for input(s): LABPROT, INR in the last 72 hours.   Studies/Results: No results found.  Impression/Plan: -Cirrhosis of the liver complicated by esophageal varices requiring band ligation in the past.  Currently on nadolol.  Recommendations ------------------------- -Proceed with EGD with possible band ligation.  Risks (bleeding, infection, bowel perforation that could require surgery, sedation-related changes in cardiopulmonary systems), benefits (identification and possible treatment of source of symptoms, exclusion of certain causes of symptoms), and alternatives (watchful waiting, radiographic imaging studies, empiric medical treatment)  were explained to patientin detail and patient wishes to proceed.    LOS: 0 days   Kathi Der  MD, FACP 09/17/2020, 8:37 AM  Contact #  [phone]

## 2020-09-18 ENCOUNTER — Other Ambulatory Visit: Payer: Self-pay | Admitting: Gastroenterology

## 2020-09-18 DIAGNOSIS — K7460 Unspecified cirrhosis of liver: Secondary | ICD-10-CM

## 2020-09-19 ENCOUNTER — Encounter (HOSPITAL_COMMUNITY): Payer: Self-pay | Admitting: Gastroenterology

## 2020-09-20 NOTE — Anesthesia Postprocedure Evaluation (Signed)
Anesthesia Post Note  Patient: Hunter Gray  Procedure(s) Performed: ESOPHAGOGASTRODUODENOSCOPY (EGD) WITH PROPOFOL ESOPHAGEAL BANDING     Patient location during evaluation: Endoscopy Anesthesia Type: MAC Level of consciousness: awake and alert Pain management: pain level controlled Vital Signs Assessment: post-procedure vital signs reviewed and stable Respiratory status: spontaneous breathing, nonlabored ventilation, respiratory function stable and patient connected to nasal cannula oxygen Cardiovascular status: stable and blood pressure returned to baseline Postop Assessment: no apparent nausea or vomiting Anesthetic complications: no   No notable events documented.  Last Vitals:  Vitals:   09/17/20 0930 09/17/20 0940  BP: 117/67 125/88  Pulse: 64 71  Resp: 12 16  Temp:    SpO2: 100% 96%    Last Pain:  Vitals:   09/17/20 0920  TempSrc: Tympanic  PainSc:                  Lorelee Mclaurin

## 2020-12-12 ENCOUNTER — Encounter (HOSPITAL_COMMUNITY): Payer: Self-pay | Admitting: Gastroenterology

## 2020-12-12 ENCOUNTER — Other Ambulatory Visit: Payer: Self-pay

## 2020-12-16 ENCOUNTER — Ambulatory Visit
Admission: RE | Admit: 2020-12-16 | Discharge: 2020-12-16 | Disposition: A | Discharge status: Home / Self Care | Payer: BC Managed Care – PPO | Source: Ambulatory Visit | Attending: Gastroenterology | Admitting: Gastroenterology

## 2020-12-16 DIAGNOSIS — K7460 Unspecified cirrhosis of liver: Secondary | ICD-10-CM

## 2020-12-23 NOTE — Anesthesia Preprocedure Evaluation (Addendum)
Anesthesia Evaluation  Patient identified by MRN, date of birth, ID band Patient awake    Reviewed: Allergy & Precautions, NPO status , Patient's Chart, lab work & pertinent test results  History of Anesthesia Complications Negative for: history of anesthetic complications  Airway Mallampati: II  TM Distance: >3 FB Neck ROM: Full    Dental  (+) Missing,    Pulmonary former smoker,    Pulmonary exam normal        Cardiovascular negative cardio ROS Normal cardiovascular exam     Neuro/Psych negative neurological ROS  negative psych ROS   GI/Hepatic GERD  Medicated and Controlled,(+) Cirrhosis   Esophageal Varices    ,   Endo/Other  diabetes, Type 2, Oral Hypoglycemic Agents  Renal/GU negative Renal ROS  negative genitourinary   Musculoskeletal negative musculoskeletal ROS (+)   Abdominal   Peds  Hematology negative hematology ROS (+)   Anesthesia Other Findings Day of surgery medications reviewed with patient.  Reproductive/Obstetrics negative OB ROS                            Anesthesia Physical Anesthesia Plan  ASA: 3  Anesthesia Plan: MAC   Post-op Pain Management: Minimal or no pain anticipated   Induction:   PONV Risk Score and Plan: 1 and Treatment may vary due to age or medical condition and Propofol infusion  Airway Management Planned: Natural Airway and Nasal Cannula  Additional Equipment: None  Intra-op Plan:   Post-operative Plan:   Informed Consent: I have reviewed the patients History and Physical, chart, labs and discussed the procedure including the risks, benefits and alternatives for the proposed anesthesia with the patient or authorized representative who has indicated his/her understanding and acceptance.       Plan Discussed with: CRNA  Anesthesia Plan Comments:        Anesthesia Quick Evaluation

## 2020-12-24 ENCOUNTER — Ambulatory Visit (HOSPITAL_COMMUNITY): Payer: BC Managed Care – PPO | Admitting: Certified Registered Nurse Anesthetist

## 2020-12-24 ENCOUNTER — Encounter (HOSPITAL_COMMUNITY)
Admission: RE | Disposition: A | Discharge status: Home / Self Care | Payer: Self-pay | Source: Home / Self Care | Attending: Gastroenterology

## 2020-12-24 ENCOUNTER — Other Ambulatory Visit: Payer: Self-pay

## 2020-12-24 ENCOUNTER — Ambulatory Visit (HOSPITAL_COMMUNITY)
Admission: RE | Admit: 2020-12-24 | Discharge: 2020-12-24 | Disposition: A | Discharge status: Home / Self Care | Payer: BC Managed Care – PPO | Attending: Gastroenterology | Admitting: Gastroenterology

## 2020-12-24 DIAGNOSIS — I8500 Esophageal varices without bleeding: Secondary | ICD-10-CM | POA: Diagnosis present

## 2020-12-24 DIAGNOSIS — Z87891 Personal history of nicotine dependence: Secondary | ICD-10-CM | POA: Insufficient documentation

## 2020-12-24 DIAGNOSIS — I8510 Secondary esophageal varices without bleeding: Secondary | ICD-10-CM | POA: Diagnosis not present

## 2020-12-24 DIAGNOSIS — K7460 Unspecified cirrhosis of liver: Secondary | ICD-10-CM | POA: Diagnosis not present

## 2020-12-24 DIAGNOSIS — E119 Type 2 diabetes mellitus without complications: Secondary | ICD-10-CM | POA: Diagnosis not present

## 2020-12-24 DIAGNOSIS — K2970 Gastritis, unspecified, without bleeding: Secondary | ICD-10-CM | POA: Insufficient documentation

## 2020-12-24 DIAGNOSIS — Z7984 Long term (current) use of oral hypoglycemic drugs: Secondary | ICD-10-CM | POA: Diagnosis not present

## 2020-12-24 HISTORY — PX: ESOPHAGOGASTRODUODENOSCOPY (EGD) WITH PROPOFOL: SHX5813

## 2020-12-24 HISTORY — PX: ESOPHAGEAL BANDING: SHX5518

## 2020-12-24 LAB — GLUCOSE, CAPILLARY: Glucose-Capillary: 127 mg/dL — ABNORMAL HIGH (ref 70–99)

## 2020-12-24 SURGERY — ESOPHAGOGASTRODUODENOSCOPY (EGD) WITH PROPOFOL
Anesthesia: Monitor Anesthesia Care

## 2020-12-24 MED ORDER — LACTATED RINGERS IV SOLN
INTRAVENOUS | Status: AC | PRN
  Administered 2020-12-24: 1000 mL via INTRAVENOUS

## 2020-12-24 MED ORDER — LIDOCAINE 2% (20 MG/ML) 5 ML SYRINGE
INTRAMUSCULAR | Status: DC | PRN
  Administered 2020-12-24: 60 mg via INTRAVENOUS

## 2020-12-24 MED ORDER — PROPOFOL 10 MG/ML IV BOLUS
INTRAVENOUS | Status: DC | PRN
  Administered 2020-12-24: 30 mg via INTRAVENOUS

## 2020-12-24 MED ORDER — PROPOFOL 500 MG/50ML IV EMUL
INTRAVENOUS | Status: DC | PRN
  Administered 2020-12-24: 150 ug/kg/min via INTRAVENOUS

## 2020-12-24 MED ORDER — PROPOFOL 500 MG/50ML IV EMUL
INTRAVENOUS | Status: AC
  Filled 2020-12-24: qty 100

## 2020-12-24 MED ORDER — SODIUM CHLORIDE 0.9 % IV SOLN: INTRAVENOUS | Status: DC

## 2020-12-24 MED ORDER — PROPOFOL 500 MG/50ML IV EMUL
INTRAVENOUS | Status: AC
  Filled 2020-12-24: qty 50

## 2020-12-24 SURGICAL SUPPLY — 15 items

## 2020-12-24 NOTE — Anesthesia Postprocedure Evaluation (Signed)
Anesthesia Post Note  Patient: Hunter Gray  Procedure(s) Performed: ESOPHAGOGASTRODUODENOSCOPY (EGD) WITH PROPOFOL ESOPHAGEAL BANDING     Patient location during evaluation: PACU Anesthesia Type: MAC Level of consciousness: awake and alert and oriented Pain management: pain level controlled Vital Signs Assessment: post-procedure vital signs reviewed and stable Respiratory status: spontaneous breathing, nonlabored ventilation and respiratory function stable Cardiovascular status: blood pressure returned to baseline Postop Assessment: no apparent nausea or vomiting Anesthetic complications: no   No notable events documented.  Last Vitals:  Vitals:   12/24/20 0856 12/24/20 0900  BP: (!) 105/54 (!) 102/54  Pulse: 65 63  Resp: 17 16  Temp:    SpO2: 97% 95%    Last Pain:  Vitals:   12/24/20 0843  TempSrc: Oral  PainSc:                  Marthenia Rolling

## 2020-12-24 NOTE — Op Note (Signed)
[facility] Patient Name: Hunter Gray Procedure Date: 12/24/2020 MRN: [id number] Attending MD: Kathi Der , MD Date of Birth: 1970/10/09 CSN: [id number] Age: 50 Admit Type: Outpatient Procedure:                Upper GI endoscopy Indications:              For therapy of esophageal varices Providers:                Kathi Der, MD, Adolph Pollack, RN, Alyce Pagan Technician, Technician Referring MD:              Medicines:                Sedation Administered by an Anesthesia Professional Complications:            No immediate complications. Estimated Blood Loss:     Estimated blood loss was minimal. Procedure:                Pre-Anesthesia Assessment:                           - Prior to the procedure, a History and Physical                            was performed, and patient medications and                            allergies were reviewed. The patient's tolerance of                            previous anesthesia was also reviewed. The risks                            and benefits of the procedure and the sedation                            options and risks were discussed with the patient.                            All questions were answered, and informed consent                            was obtained. Prior Anticoagulants: The patient has                            taken no previous anticoagulant or antiplatelet                            agents. ASA Grade Assessment: III - A patient with                            severe systemic disease. After reviewing the risks  and benefits, the patient was deemed in                            satisfactory condition to undergo the procedure.                           After obtaining informed consent, the endoscope was                            passed under direct vision. Throughout the                            procedure, the patient's blood  pressure, pulse, and                            oxygen saturations were monitored continuously. The                            [id number]-H190 ([id number]) Olympus endoscope was introduced                            through the mouth, and advanced to the second part                            of duodenum. The upper GI endoscopy was                            accomplished without difficulty. The patient                            tolerated the procedure well. Scope In: Scope Out: Findings:      Three columns of large (> 5 mm) varices with no bleeding and no stigmata       of recent bleeding were found in the distal esophagus,. Red wale signs       were present. Stigmata of prior treatment were evident. The varices       appeared smaller than they were at prior exam. Three bands were       successfully placed with complete eradication, resulting in deflation of       varices. There was some oozing of blood after initial 2 bands placement       but no bleeding at the end of the maneuver.      Scattered moderate inflammation characterized by erosions and erythema       was found in the prepyloric region of the stomach.      The cardia and gastric fundus were normal on retroflexion.      The duodenal bulb, first portion of the duodenum and second portion of       the duodenum were normal. Impression:               - Large (> 5 mm) esophageal varices with no                            bleeding and no stigmata of recent bleeding.  Completely eradicated. Banded.                           - Gastritis.                           - Normal duodenal bulb, first portion of the                            duodenum and second portion of the duodenum.                           - No specimens collected. Moderate Sedation:      Moderate (conscious) sedation was personally administered by an       anesthesia professional. The following parameters were monitored: oxygen       saturation,  heart rate, blood pressure, and response to care. Recommendation:           - Patient has a contact number available for                            emergencies. The signs and symptoms of potential                            delayed complications were discussed with the                            patient. Return to normal activities tomorrow.                            Written discharge instructions were provided to the                            patient.                           - Resume previous diet.                           - Continue present medications.                           - Repeat upper endoscopy in 3 months for                            retreatment.                           - Return to GI clinic as previously scheduled. Procedure Code(s):        --- Professional ---                           [phone], Esophagogastroduodenoscopy, flexible,                            transoral; with band ligation of esophageal/gastric  varices Diagnosis Code(s):        --- Professional ---                           I85.00, Esophageal varices without bleeding                           K29.70, Gastritis, unspecified, without bleeding CPT copyright 2019 [employer]. All rights reserved. The codes documented in this report are preliminary and upon coder review may  be revised to meet current compliance requirements. Kathi Der, MD Kathi Der, MD 12/24/2020 8:39:52 AM Number of Addenda: 0

## 2020-12-24 NOTE — Anesthesia Postprocedure Evaluation (Deleted)
Anesthesia Post Note  Patient: Hunter Gray  Procedure(s) Performed: ESOPHAGOGASTRODUODENOSCOPY (EGD) WITH PROPOFOL ESOPHAGEAL BANDING     Patient location during evaluation: PACU Anesthesia Type: MAC Level of consciousness: awake and alert and oriented Pain management: pain level controlled Vital Signs Assessment: post-procedure vital signs reviewed and stable Respiratory status: spontaneous breathing, nonlabored ventilation and respiratory function stable Cardiovascular status: blood pressure returned to baseline Postop Assessment: no apparent nausea or vomiting Anesthetic complications: no   No notable events documented.  Last Vitals:  Vitals:   12/24/20 0733  BP: 123/69  Pulse: 66  Resp: 17  Temp: 36.8 C  SpO2: 97%    Last Pain:  Vitals:   12/24/20 0733  TempSrc: Oral  PainSc: 0-No pain                 Marthenia Rolling

## 2020-12-24 NOTE — Anesthesia Procedure Notes (Signed)
Procedure Name: MAC Date/Time: 12/24/2020 8:21 AM Performed by: West Pugh, CRNA Pre-anesthesia Checklist: Patient identified, Emergency Drugs available, Suction available, Patient being monitored and Timeout performed Patient Re-evaluated:Patient Re-evaluated prior to induction Oxygen Delivery Method: Simple face mask Preoxygenation: Pre-oxygenation with 100% oxygen Placement Confirmation: positive ETCO2 Dental Injury: Teeth and Oropharynx as per pre-operative assessment

## 2020-12-24 NOTE — H&P (Signed)
Primary Care Physician:  Larkin Ina Primary Gastroenterologist:  Dr.Girtie Wiersma  Reason for Visit : EGD for variceal band ligation  HPI: Hunter Gray is a 50 y.o. male with past medical history of cirrhosis complicated by esophageal varices presented to [facility] for outpatient EGD.  Last EGD in Sep 2022 showed large esophageal varices and 4 bands were placed.    Patient denies any acute issues since last visit.  Denies any abdominal pain, nausea or vomiting.  Denies blood in the stool or black stool.  Denies confusion.  Overall doing well from GI standpoint.  Currently on nadolol.  Past Medical History:  Diagnosis Date   Cirrhosis (HCC)    Cirrhosis of liver (HCC)    Diabetes mellitus without complication (HCC)    Esophageal varices (HCC)    Vision abnormalities     Past Surgical History:  Procedure Laterality Date   ESOPHAGEAL BANDING  05/14/2019   Procedure: ESOPHAGEAL BANDING;  Surgeon: Kerin Salen, MD;  Location: [facility];  Service: Gastroenterology;;   ESOPHAGEAL BANDING  08/23/2019   Procedure: ESOPHAGEAL BANDING;  Surgeon: Kerin Salen, MD;  Location: [facility];  Service: Gastroenterology;;   ESOPHAGEAL BANDING N/A 11/16/2019   Procedure: ESOPHAGEAL BANDING;  Surgeon: Kathi Der, MD;  Location: [facility];  Service: Gastroenterology;  Laterality: N/A;   ESOPHAGEAL BANDING  01/25/2020   Procedure: ESOPHAGEAL BANDING;  Surgeon: Kathi Der, MD;  Location: [facility];  Service: Gastroenterology;;   ESOPHAGEAL BANDING N/A 09/17/2020   Procedure: ESOPHAGEAL BANDING;  Surgeon: Kathi Der, MD;  Location: [facility];  Service: Gastroenterology;  Laterality: N/A;   ESOPHAGOGASTRODUODENOSCOPY (EGD) WITH PROPOFOL N/A 05/14/2019   Procedure: ESOPHAGOGASTRODUODENOSCOPY (EGD) WITH PROPOFOL;  Surgeon: Kerin Salen, MD;  Location: [facility];  Service: Gastroenterology;  Laterality: N/A;   ESOPHAGOGASTRODUODENOSCOPY (EGD) WITH PROPOFOL  N/A 08/23/2019   Procedure: ESOPHAGOGASTRODUODENOSCOPY (EGD) WITH PROPOFOL;  Surgeon: Kerin Salen, MD;  Location: [facility];  Service: Gastroenterology;  Laterality: N/A;   ESOPHAGOGASTRODUODENOSCOPY (EGD) WITH PROPOFOL N/A 09/15/2019   Procedure: ESOPHAGOGASTRODUODENOSCOPY (EGD) WITH PROPOFOL;  Surgeon: Kathi Der, MD;  Location: [facility];  Service: Gastroenterology;  Laterality: N/A;   ESOPHAGOGASTRODUODENOSCOPY (EGD) WITH PROPOFOL N/A 11/16/2019   Procedure: ESOPHAGOGASTRODUODENOSCOPY (EGD) WITH PROPOFOL;  Surgeon: Kathi Der, MD;  Location: [facility];  Service: Gastroenterology;  Laterality: N/A;   ESOPHAGOGASTRODUODENOSCOPY (EGD) WITH PROPOFOL N/A 01/25/2020   Procedure: ESOPHAGOGASTRODUODENOSCOPY (EGD) WITH PROPOFOL;  Surgeon: Kathi Der, MD;  Location: [facility];  Service: Gastroenterology;  Laterality: N/A;   ESOPHAGOGASTRODUODENOSCOPY (EGD) WITH PROPOFOL N/A 09/17/2020   Procedure: ESOPHAGOGASTRODUODENOSCOPY (EGD) WITH PROPOFOL;  Surgeon: Kathi Der, MD;  Location: [facility];  Service: Gastroenterology;  Laterality: N/A;   GASTRIC VARICES BANDING N/A 09/15/2019   Procedure: GASTRIC VARICES BANDING;  Surgeon: Kathi Der, MD;  Location: [facility];  Service: Gastroenterology;  Laterality: N/A;   SHOULDER ARTHROSCOPY Left age 38    Prior to Admission medications   Medication Sig Start Date End Date Taking? Authorizing Provider  fluticasone (FLONASE) 50 MCG/ACT nasal spray Place 1 spray into both nostrils 2 (two) times daily as needed for allergies or rhinitis (nasal congestion).    Yes [provider]  gabapentin (NEURONTIN) 600 MG tablet Take 600-1,200 mg by mouth See admin instructions. Take 1 tablet (600 mg) by mouth in the morning, take 1 tablet (600 mg) by mouth at lunch, & take 2 tablets (1200 mg) by mouth at bedtime. 03/30/19  Yes [provider]  nadolol (CORGARD) 20 MG tablet Take 0.5 tablets (  10 mg total) by mouth  daily. Patient taking differently: Take 20 mg by mouth daily.  05/19/19 09/07/20 Yes Kyle, Tyrone A, DO  SYNJARDY XR 05-998 MG TB24 Take 2 tablets by mouth daily with breakfast.  04/28/19  Yes [provider]  VICTOZA 18 MG/3ML SOPN Inject 1.8 mg into the skin daily.  11/18/17  Yes [provider]  pantoprazole (PROTONIX) 40 MG tablet Take 1 tablet (40 mg total) by mouth 2 (two) times daily. Patient not taking: Reported on 11/15/2019 05/19/19 09/07/20  Margie Ege A, DO    Scheduled Meds: Continuous Infusions:  sodium chloride     lactated ringers 1,000 mL (12/24/20 0747)   PRN Meds:.  Allergies as of 09/18/2020 - Review Complete 09/17/2020  Allergen Reaction Noted   Glimepiride Other (See Comments) 05/24/2019    Family History  Problem Relation Age of Onset   Diabetes type II Mother    Heart disease Mother    Diabetes type II Father    Diabetes type II Sister     Social History   Socioeconomic History   Marital status: Married    Spouse name: Not on file   Number of children: Not on file   Years of education: Not on file   Highest education level: Not on file  Occupational History   Not on file  Tobacco Use   Smoking status: Former   Smokeless tobacco: Never  Vaping Use   Vaping Use: Never used  Substance and Sexual Activity   Alcohol use: Not Currently   Drug use: Not Currently   Sexual activity: Not on file  Other Topics Concern   Not on file  Social History Narrative   ** Merged History Encounter **       Social Determinants of Health   Financial Resource Strain: Not on file  Food Insecurity: Not on file  Transportation Needs: Not on file  Physical Activity: Not on file  Stress: Not on file  Social Connections: Not on file  Intimate Partner Violence: Not on file    Review of Systems: All negative except as stated above in HPI.  Physical Exam: Vital signs: Vitals:   12/24/20 0733  BP: 123/69  Pulse: 66  Resp: 17  Temp: 98.2 F  (36.8 C)  SpO2: 97%     General:   Alert,  Well-developed, well-nourished, pleasant and cooperative in NAD Lungs:  Clear throughout to auscultation.   No wheezes, crackles, or rhonchi. No acute distress. Heart:  Regular rate and rhythm; no murmurs, clicks, rubs,  or gallops. Abdomen: Soft, nontender, nondistended, bowel sounds present.  No peritoneal signs Rectal:  Deferred  GI:  Lab Results: No results for input(s): WBC, HGB, HCT, PLT in the last 72 hours. BMET No results for input(s): NA, K, CL, CO2, GLUCOSE, BUN, CREATININE, CALCIUM in the last 72 hours. LFT No results for input(s): PROT, ALBUMIN, AST, ALT, ALKPHOS, BILITOT, BILIDIR, IBILI in the last 72 hours. PT/INR No results for input(s): LABPROT, INR in the last 72 hours.   Studies/Results: No results found.  Impression/Plan: -Cirrhosis of the liver complicated by esophageal varices requiring band ligation .  Currently on nadolol.  Recommendations ------------------------- -Proceed with EGD with possible band ligation.  Risks (bleeding, infection, bowel perforation that could require surgery, sedation-related changes in cardiopulmonary systems), benefits (identification and possible treatment of source of symptoms, exclusion of certain causes of symptoms), and alternatives (watchful waiting, radiographic imaging studies, empiric medical treatment)  were explained to patientin detail and  patient wishes to proceed.    LOS: 0 days   Kathi Der  MD, FACP 12/24/2020, 7:49 AM  Contact #  [phone]

## 2020-12-24 NOTE — Transfer of Care (Addendum)
Immediate Anesthesia Transfer of Care Note  Patient: Hunter Gray  Procedure(s) Performed: ESOPHAGOGASTRODUODENOSCOPY (EGD) WITH PROPOFOL ESOPHAGEAL BANDING  Patient Location: PACU and Endoscopy Unit  Anesthesia Type:MAC  Level of Consciousness: awake, alert , oriented and patient cooperative  Airway & Oxygen Therapy: Patient Spontanous Breathing and Patient connected to face mask oxygen  Post-op Assessment: Report given to RN, Post -op Vital signs reviewed and stable and Patient moving all extremities  Post vital signs: Reviewed and stable  Last Vitals:  Vitals Value Taken Time  BP 103/58 12/24/20 0842  Temp    Pulse 68 12/24/20 0843  Resp 13 12/24/20 0843  SpO2 100 % 12/24/20 0843  Vitals shown include unvalidated device data.  Last Pain:  Vitals:   12/24/20 0733  TempSrc: Oral  PainSc: 0-No pain         Complications: No notable events documented.

## 2020-12-25 ENCOUNTER — Encounter (HOSPITAL_COMMUNITY): Payer: Self-pay | Admitting: Gastroenterology

## 2022-02-18 ENCOUNTER — Other Ambulatory Visit: Payer: Self-pay | Admitting: Gastroenterology

## 2022-02-18 DIAGNOSIS — K7460 Unspecified cirrhosis of liver: Secondary | ICD-10-CM

## 2022-02-18 DIAGNOSIS — I8500 Esophageal varices without bleeding: Secondary | ICD-10-CM

## 2022-02-25 ENCOUNTER — Other Ambulatory Visit: Payer: BC Managed Care – PPO

## 2022-03-17 ENCOUNTER — Other Ambulatory Visit: Payer: BC Managed Care – PPO

## 2022-04-30 ENCOUNTER — Other Ambulatory Visit: Payer: Self-pay | Admitting: Gastroenterology

## 2022-05-18 ENCOUNTER — Other Ambulatory Visit (HOSPITAL_BASED_OUTPATIENT_CLINIC_OR_DEPARTMENT_OTHER): Payer: Self-pay

## 2022-06-15 ENCOUNTER — Encounter (HOSPITAL_COMMUNITY): Payer: Self-pay | Admitting: Gastroenterology

## 2022-06-22 NOTE — Progress Notes (Addendum)
Pt returned phone call.  Pt has following all medication instructions and held required meds.

## 2022-06-23 ENCOUNTER — Encounter (HOSPITAL_COMMUNITY): Payer: Self-pay | Admitting: Gastroenterology

## 2022-06-23 ENCOUNTER — Encounter (HOSPITAL_COMMUNITY)
Admission: RE | Disposition: A | Discharge status: Home / Self Care | Payer: Self-pay | Source: Home / Self Care | Attending: Gastroenterology

## 2022-06-23 ENCOUNTER — Ambulatory Visit (HOSPITAL_COMMUNITY): Payer: BC Managed Care – PPO | Admitting: Certified Registered Nurse Anesthetist

## 2022-06-23 ENCOUNTER — Other Ambulatory Visit: Payer: Self-pay

## 2022-06-23 ENCOUNTER — Ambulatory Visit (HOSPITAL_COMMUNITY)
Admission: RE | Admit: 2022-06-23 | Discharge: 2022-06-23 | Disposition: A | Discharge status: Home / Self Care | Payer: BC Managed Care – PPO | Attending: Gastroenterology | Admitting: Gastroenterology

## 2022-06-23 DIAGNOSIS — Z87891 Personal history of nicotine dependence: Secondary | ICD-10-CM | POA: Diagnosis not present

## 2022-06-23 DIAGNOSIS — K5520 Angiodysplasia of colon without hemorrhage: Secondary | ICD-10-CM | POA: Insufficient documentation

## 2022-06-23 DIAGNOSIS — K3189 Other diseases of stomach and duodenum: Secondary | ICD-10-CM | POA: Insufficient documentation

## 2022-06-23 DIAGNOSIS — E119 Type 2 diabetes mellitus without complications: Secondary | ICD-10-CM | POA: Insufficient documentation

## 2022-06-23 DIAGNOSIS — K7460 Unspecified cirrhosis of liver: Secondary | ICD-10-CM | POA: Insufficient documentation

## 2022-06-23 DIAGNOSIS — K648 Other hemorrhoids: Secondary | ICD-10-CM | POA: Diagnosis not present

## 2022-06-23 DIAGNOSIS — I8510 Secondary esophageal varices without bleeding: Secondary | ICD-10-CM | POA: Insufficient documentation

## 2022-06-23 DIAGNOSIS — Z7984 Long term (current) use of oral hypoglycemic drugs: Secondary | ICD-10-CM | POA: Insufficient documentation

## 2022-06-23 DIAGNOSIS — Z1211 Encounter for screening for malignant neoplasm of colon: Secondary | ICD-10-CM | POA: Insufficient documentation

## 2022-06-23 DIAGNOSIS — K635 Polyp of colon: Secondary | ICD-10-CM | POA: Insufficient documentation

## 2022-06-23 DIAGNOSIS — K219 Gastro-esophageal reflux disease without esophagitis: Secondary | ICD-10-CM | POA: Insufficient documentation

## 2022-06-23 DIAGNOSIS — K644 Residual hemorrhoidal skin tags: Secondary | ICD-10-CM | POA: Diagnosis not present

## 2022-06-23 HISTORY — PX: ESOPHAGOGASTRODUODENOSCOPY (EGD) WITH PROPOFOL: SHX5813

## 2022-06-23 HISTORY — PX: POLYPECTOMY: SHX5525

## 2022-06-23 HISTORY — PX: COLONOSCOPY WITH PROPOFOL: SHX5780

## 2022-06-23 LAB — GLUCOSE, CAPILLARY
Glucose-Capillary: 128 mg/dL — ABNORMAL HIGH (ref 70–99)
Glucose-Capillary: 111 mg/dL — ABNORMAL HIGH (ref 70–99)

## 2022-06-23 SURGERY — COLONOSCOPY WITH PROPOFOL
Anesthesia: Monitor Anesthesia Care

## 2022-06-23 MED ORDER — LIDOCAINE 2% (20 MG/ML) 5 ML SYRINGE
INTRAMUSCULAR | Status: DC | PRN
  Administered 2022-06-23: 80 mg via INTRAVENOUS

## 2022-06-23 MED ORDER — PROPOFOL 10 MG/ML IV BOLUS
INTRAVENOUS | Status: DC | PRN
  Administered 2022-06-23: 30 mg via INTRAVENOUS

## 2022-06-23 MED ORDER — ONDANSETRON HCL 4 MG/2ML IJ SOLN
INTRAMUSCULAR | Status: DC | PRN
  Administered 2022-06-23: 4 mg via INTRAVENOUS

## 2022-06-23 MED ORDER — PROPOFOL 500 MG/50ML IV EMUL
INTRAVENOUS | Status: DC | PRN
  Administered 2022-06-23: 150 ug/kg/min via INTRAVENOUS

## 2022-06-23 MED ORDER — PROPOFOL 1000 MG/100ML IV EMUL
INTRAVENOUS | Status: AC
  Filled 2022-06-23: qty 100

## 2022-06-23 MED ORDER — LACTATED RINGERS IV SOLN: INTRAVENOUS | Status: DC

## 2022-06-23 MED ORDER — PHENYLEPHRINE 80 MCG/ML (10ML) SYRINGE FOR IV PUSH (FOR BLOOD PRESSURE SUPPORT)
PREFILLED_SYRINGE | INTRAVENOUS | Status: DC | PRN
  Administered 2022-06-23: 80 ug via INTRAVENOUS

## 2022-06-23 SURGICAL SUPPLY — 25 items

## 2022-06-23 NOTE — Op Note (Signed)
[facility] Patient Name: Hunter Gray Procedure Date: 06/23/2022 MRN: [id number] Attending MD: Kathi Der , MD, [id number] Date of Birth: 1970/12/11 CSN: [id number] Age: 52 Admit Type: Outpatient Procedure:                Upper GI endoscopy Indications:              Follow-up of esophageal varices Providers:                Kathi Der, MD, Martha Clan, RN,                            Lorenza Evangelist, RN, Leanne Lovely, Technician,                            Maricela Curet, CRNA Referring MD:              Medicines:                Sedation Administered by an Anesthesia Professional Complications:            No immediate complications. Estimated Blood Loss:     Estimated blood loss was minimal. Procedure:                Pre-Anesthesia Assessment:                           - Prior to the procedure, a History and Physical                            was performed, and patient medications and                            allergies were reviewed. The patient's tolerance of                            previous anesthesia was also reviewed. The risks                            and benefits of the procedure and the sedation                            options and risks were discussed with the patient.                            All questions were answered, and informed consent                            was obtained. Prior Anticoagulants: The patient has                            taken no anticoagulant or antiplatelet agents. ASA                            Grade Assessment: III - A patient with severe  systemic disease. After reviewing the risks and                            benefits, the patient was deemed in satisfactory                            condition to undergo the procedure.                           After obtaining informed consent, the endoscope was                            passed under direct vision. Throughout the                             procedure, the patient's blood pressure, pulse, and                            oxygen saturations were monitored continuously. The                            [id number]-[id number] ([id number]) Olympus endoscope was introduced                            through the mouth, and advanced to the second part                            of duodenum. The upper GI endoscopy was                            accomplished without difficulty. The patient                            tolerated the procedure well. Scope In: Scope Out: Findings:      Three columns of small (< 5 mm) varices with no bleeding and no stigmata       of recent bleeding were found in the distal esophagus. No red wale signs       were present. Stigmata of prior treatment were evident. The varices       appeared smaller than they were at prior exam.      Diffuse mildly erythematous mucosa was found in the entire examined       stomach.      The cardia and gastric fundus were normal on retroflexion.      The duodenal bulb, first portion of the duodenum and second portion of       the duodenum were normal. Impression:               - Small (< 5 mm) esophageal varices with no                            bleeding and no stigmata of recent bleeding.                           - Erythematous mucosa in the stomach.                           -  Normal duodenal bulb, first portion of the                            duodenum and second portion of the duodenum.                           - No specimens collected. Moderate Sedation:      Moderate (conscious) sedation was personally administered by an       anesthesia professional. The following parameters were monitored: oxygen       saturation, heart rate, blood pressure, and response to care. Recommendation:           - Patient has a contact number available for                            emergencies. The signs and symptoms of potential                            delayed complications  were discussed with the                            patient. Return to normal activities tomorrow.                            Written discharge instructions were provided to the                            patient.                           - Resume previous diet.                           - Continue present medications.                           - Repeat upper endoscopy in 1 year for surveillance.                           - Return to my office as previously scheduled. Procedure Code(s):        --- Professional ---                           [phone], Esophagogastroduodenoscopy, flexible,                            transoral; diagnostic, including collection of                            specimen(s) by brushing or washing, when performed                            (separate procedure) Diagnosis Code(s):        --- Professional ---  I85.00, Esophageal varices without bleeding                           K31.89, Other diseases of stomach and duodenum CPT copyright 2022 [employer]. All rights reserved. The codes documented in this report are preliminary and upon coder review may  be revised to meet current compliance requirements. Kathi Der, MD Kathi Der, MD 06/23/2022 10:38:12 AM Number of Addenda: 0

## 2022-06-23 NOTE — Transfer of Care (Signed)
Immediate Anesthesia Transfer of Care Note  Patient: Hunter Gray.  Procedure(s) Performed: COLONOSCOPY WITH PROPOFOL ESOPHAGOGASTRODUODENOSCOPY (EGD) WITH PROPOFOL with Pos. Banding POLYPECTOMY  Patient Location: Endoscopy Unit  Anesthesia Type:MAC  Level of Consciousness: drowsy and patient cooperative  Airway & Oxygen Therapy: Patient Spontanous Breathing and Patient connected to face mask oxygen  Post-op Assessment: Report given to RN and Post -op Vital signs reviewed and stable  Post vital signs: Reviewed and stable  Last Vitals:  Vitals Value Taken Time  BP    Temp    Pulse 63 06/23/22 1043  Resp 10 06/23/22 1043  SpO2 100 % 06/23/22 1043  Vitals shown include unvalidated device data.  Last Pain:  Vitals:   06/23/22 0830  TempSrc: Temporal  PainSc: 0-No pain         Complications: No notable events documented.

## 2022-06-23 NOTE — H&P (Signed)
Primary Care Physician:  Larkin Ina Primary Gastroenterologist:  Dr.Yuette Putnam  Reason for Visit : EGD for variceal band ligation  HPI: Hunter Gray. is a 52 y.o. male with past medical history of cirrhosis complicated by esophageal varices presented to [facility] for outpatient EGD.  Last EGD in Dec 2022 showed large esophageal varices and 4 bands were placed repeat was recommended in 3 months.  Patient denies any acute issues since last visit.  Denies any abdominal pain, nausea or vomiting.  Denies blood in the stool or black stool.  Denies confusion.  Overall doing well from GI standpoint.  Currently on nadolol.   Colonoscopy in December 2019 showed multiple adenomas and hemorrhoids. Normal TI. Repeat colonoscopy recommended in 3 years.  Past Medical History:  Diagnosis Date   Cirrhosis (HCC)    Cirrhosis of liver (HCC)    Diabetes mellitus without complication (HCC)    Esophageal varices (HCC)    Vision abnormalities     Past Surgical History:  Procedure Laterality Date   ESOPHAGEAL BANDING  05/14/2019   Procedure: ESOPHAGEAL BANDING;  Surgeon: Kerin Salen, MD;  Location: [facility];  Service: Gastroenterology;;   ESOPHAGEAL BANDING  08/23/2019   Procedure: ESOPHAGEAL BANDING;  Surgeon: Kerin Salen, MD;  Location: [facility];  Service: Gastroenterology;;   ESOPHAGEAL BANDING N/A 11/16/2019   Procedure: ESOPHAGEAL BANDING;  Surgeon: Kathi Der, MD;  Location: [facility];  Service: Gastroenterology;  Laterality: N/A;   ESOPHAGEAL BANDING  01/25/2020   Procedure: ESOPHAGEAL BANDING;  Surgeon: Kathi Der, MD;  Location: [facility];  Service: Gastroenterology;;   ESOPHAGEAL BANDING N/A 09/17/2020   Procedure: ESOPHAGEAL BANDING;  Surgeon: Kathi Der, MD;  Location: [facility];  Service: Gastroenterology;  Laterality: N/A;   ESOPHAGEAL BANDING N/A 12/24/2020   Procedure: ESOPHAGEAL BANDING;  Surgeon: Kathi Der, MD;   Location: [facility];  Service: Gastroenterology;  Laterality: N/A;   ESOPHAGOGASTRODUODENOSCOPY (EGD) WITH PROPOFOL N/A 05/14/2019   Procedure: ESOPHAGOGASTRODUODENOSCOPY (EGD) WITH PROPOFOL;  Surgeon: Kerin Salen, MD;  Location: [facility];  Service: Gastroenterology;  Laterality: N/A;   ESOPHAGOGASTRODUODENOSCOPY (EGD) WITH PROPOFOL N/A 08/23/2019   Procedure: ESOPHAGOGASTRODUODENOSCOPY (EGD) WITH PROPOFOL;  Surgeon: Kerin Salen, MD;  Location: [facility];  Service: Gastroenterology;  Laterality: N/A;   ESOPHAGOGASTRODUODENOSCOPY (EGD) WITH PROPOFOL N/A 09/15/2019   Procedure: ESOPHAGOGASTRODUODENOSCOPY (EGD) WITH PROPOFOL;  Surgeon: Kathi Der, MD;  Location: [facility];  Service: Gastroenterology;  Laterality: N/A;   ESOPHAGOGASTRODUODENOSCOPY (EGD) WITH PROPOFOL N/A 11/16/2019   Procedure: ESOPHAGOGASTRODUODENOSCOPY (EGD) WITH PROPOFOL;  Surgeon: Kathi Der, MD;  Location: [facility];  Service: Gastroenterology;  Laterality: N/A;   ESOPHAGOGASTRODUODENOSCOPY (EGD) WITH PROPOFOL N/A 01/25/2020   Procedure: ESOPHAGOGASTRODUODENOSCOPY (EGD) WITH PROPOFOL;  Surgeon: Kathi Der, MD;  Location: [facility];  Service: Gastroenterology;  Laterality: N/A;   ESOPHAGOGASTRODUODENOSCOPY (EGD) WITH PROPOFOL N/A 09/17/2020   Procedure: ESOPHAGOGASTRODUODENOSCOPY (EGD) WITH PROPOFOL;  Surgeon: Kathi Der, MD;  Location: [facility];  Service: Gastroenterology;  Laterality: N/A;   ESOPHAGOGASTRODUODENOSCOPY (EGD) WITH PROPOFOL N/A 12/24/2020   Procedure: ESOPHAGOGASTRODUODENOSCOPY (EGD) WITH PROPOFOL;  Surgeon: Kathi Der, MD;  Location: [facility];  Service: Gastroenterology;  Laterality: N/A;   GASTRIC VARICES BANDING N/A 09/15/2019   Procedure: GASTRIC VARICES BANDING;  Surgeon: Kathi Der, MD;  Location: [facility];  Service: Gastroenterology;  Laterality: N/A;   SHOULDER ARTHROSCOPY Left age 51    Prior to Admission medications   Medication Sig Start  Date End Date Taking? Authorizing Provider  fluticasone (FLONASE) 50 MCG/ACT nasal spray Place 1  spray into both nostrils 2 (two) times daily as needed for allergies or rhinitis (nasal congestion).    Yes [provider]  gabapentin (NEURONTIN) 600 MG tablet Take 600-1,200 mg by mouth See admin instructions. Take 1 tablet (600 mg) by mouth in the morning, take 1 tablet (600 mg) by mouth at lunch, & take 2 tablets (1200 mg) by mouth at bedtime. 03/30/19  Yes [provider]  nadolol (CORGARD) 20 MG tablet Take 0.5 tablets (10 mg total) by mouth daily. Patient taking differently: Take 20 mg by mouth daily.  05/19/19 09/07/20 Yes Kyle, Tyrone A, DO  SYNJARDY XR 05-998 MG TB24 Take 2 tablets by mouth daily with breakfast.  04/28/19  Yes [provider]  VICTOZA 18 MG/3ML SOPN Inject 1.8 mg into the skin daily.  11/18/17  Yes [provider]  pantoprazole (PROTONIX) 40 MG tablet Take 1 tablet (40 mg total) by mouth 2 (two) times daily. Patient not taking: Reported on 11/15/2019 05/19/19 09/07/20  Margie Ege A, DO    Scheduled Meds: Continuous Infusions:  lactated ringers 10 mL/hr at 06/23/22 0911   PRN Meds:.  Allergies as of 04/30/2022 - Review Complete 12/24/2020  Allergen Reaction Noted   Glimepiride Other (See Comments) 05/24/2019    Family History  Problem Relation Age of Onset   Diabetes type II Mother    Heart disease Mother    Diabetes type II Father    Diabetes type II Sister     Social History   Socioeconomic History   Marital status: Married    Spouse name: Not on file   Number of children: Not on file   Years of education: Not on file   Highest education level: Not on file  Occupational History   Not on file  Tobacco Use   Smoking status: Former   Smokeless tobacco: Never  Vaping Use   Vaping Use: Never used  Substance and Sexual Activity   Alcohol use: Not Currently   Drug use: Not Currently   Sexual activity: Not on file  Other  Topics Concern   Not on file  Social History Narrative   ** Merged History Encounter **       Social Determinants of Health   Financial Resource Strain: Not on file  Food Insecurity: Not on file  Transportation Needs: Not on file  Physical Activity: Not on file  Stress: Not on file  Social Connections: Not on file  Intimate Partner Violence: Not on file    Physical Exam: Vital signs: Vitals:   06/23/22 0830  BP: (!) 102/55  Pulse: 65  Resp: 20  Temp: 98 F (36.7 C)  SpO2: 96%     General:   Alert,  Well-developed, well-nourished, pleasant and cooperative in NAD Lungs:  Clear throughout to auscultation.   No wheezes, crackles, or rhonchi. No acute distress. Heart:  Regular rate and rhythm; no murmurs, clicks, rubs,  or gallops. Abdomen: Soft, nontender, nondistended, bowel sounds present.  No peritoneal signs Rectal:  Deferred  GI:  Lab Results: No results for input(s): "WBC", "HGB", "HCT", "PLT" in the last 72 hours. BMET No results for input(s): "NA", "K", "CL", "CO2", "GLUCOSE", "BUN", "CREATININE", "CALCIUM" in the last 72 hours. LFT No results for input(s): "PROT", "ALBUMIN", "AST", "ALT", "ALKPHOS", "BILITOT", "BILIDIR", "IBILI" in the last 72 hours. PT/INR No results for input(s): "LABPROT", "INR" in the last 72 hours.   Studies/Results: No results found.  Impression/Plan: -Cirrhosis of the liver complicated by esophageal varices requiring  band ligation .  Currently on nadolol. -History of adenomatous polyps  Recommendations ------------------------- -Proceed with EGD with possible band ligation and colonoscopy.  Risks (bleeding, infection, bowel perforation that could require surgery, sedation-related changes in cardiopulmonary systems), benefits (identification and possible treatment of source of symptoms, exclusion of certain causes of symptoms), and alternatives (watchful waiting, radiographic imaging studies, empiric medical treatment)  were  explained to patientin detail and patient wishes to proceed.    LOS: 0 days   Kathi Der  MD, FACP 06/23/2022, 9:18 AM  Contact #  [phone]

## 2022-06-23 NOTE — Progress Notes (Signed)
Patient had hypotension post procedure of 80's/50's. Repositioned BP cuff with improvement of 101/53. Anesthesia is aware and treated.

## 2022-06-23 NOTE — Anesthesia Procedure Notes (Signed)
Procedure Name: MAC Date/Time: 06/23/2022 10:00 AM  Performed by: Orest Dikes, CRNAPre-anesthesia Checklist: Patient identified, Emergency Drugs available, Suction available and Patient being monitored Oxygen Delivery Method: Simple face mask

## 2022-06-23 NOTE — Discharge Instructions (Signed)

## 2022-06-23 NOTE — Anesthesia Postprocedure Evaluation (Signed)
Anesthesia Post Note  Patient: Hunter Gray.  Procedure(s) Performed: COLONOSCOPY WITH PROPOFOL ESOPHAGOGASTRODUODENOSCOPY (EGD) WITH PROPOFOL with Pos. Banding POLYPECTOMY     Patient location during evaluation: Endoscopy Anesthesia Type: MAC Level of consciousness: awake and alert, patient cooperative and oriented Pain management: pain level controlled Vital Signs Assessment: post-procedure vital signs reviewed and stable Respiratory status: nonlabored ventilation, spontaneous breathing and respiratory function stable Cardiovascular status: blood pressure returned to baseline and stable Postop Assessment: no apparent nausea or vomiting and adequate PO intake Anesthetic complications: no   No notable events documented.  Last Vitals:  Vitals:   06/23/22 1059 06/23/22 1104  BP: (!) 93/51 (!) 99/56  Pulse: 67   Resp: 17   Temp:    SpO2: 96%     Last Pain:  Vitals:   06/23/22 1041  TempSrc: Temporal  PainSc:                  Kristoph Sattler,E. Audreena Sachdeva

## 2022-06-23 NOTE — Anesthesia Preprocedure Evaluation (Signed)
Anesthesia Evaluation  Patient identified by MRN, date of birth, ID band Patient awake    Reviewed: Allergy & Precautions, NPO status , Patient's Chart, lab work & pertinent test results, reviewed documented beta blocker date and time   History of Anesthesia Complications Negative for: history of anesthetic complications  Airway Mallampati: I  TM Distance: >3 FB Neck ROM: Full    Dental   Pulmonary former smoker   breath sounds clear to auscultation       Cardiovascular hypertension, Pt. on medications and Pt. on home beta blockers  Rhythm:Regular Rate:Normal     Neuro/Psych negative neurological ROS  negative psych ROS   GI/Hepatic ,GERD  Medicated,,(+) Cirrhosis   Esophageal Varices    Nonalcoholic steatohepatitis   Endo/Other  diabetes (glu 128 (no meds in 3d)), Oral Hypoglycemic Agents    Renal/GU negative Renal ROS     Musculoskeletal   Abdominal   Peds  Hematology   Anesthesia Other Findings   Reproductive/Obstetrics                              Anesthesia Physical Anesthesia Plan  ASA: 3  Anesthesia Plan: MAC   Post-op Pain Management: Minimal or no pain anticipated   Induction:   PONV Risk Score and Plan: 1 and Treatment may vary due to age or medical condition  Airway Management Planned: Natural Airway and Nasal Cannula  Additional Equipment: None  Intra-op Plan:   Post-operative Plan:   Informed Consent: I have reviewed the patients History and Physical, chart, labs and discussed the procedure including the risks, benefits and alternatives for the proposed anesthesia with the patient or authorized representative who has indicated his/her understanding and acceptance.     Dental advisory given  Plan Discussed with: CRNA and Surgeon  Anesthesia Plan Comments:          Anesthesia Quick Evaluation

## 2022-06-23 NOTE — Op Note (Signed)
[facility] Patient Name: Hunter Gray Procedure Date: 06/23/2022 MRN: [id number] Attending MD: Kathi Der , MD, [id number] Date of Birth: 20-Jul-1970 CSN: [id number] Age: 52 Admit Type: Outpatient Procedure:                Colonoscopy Indications:              High risk colon cancer surveillance: Personal                            history of multiple (3 or more) adenomas, Last                            colonoscopy: 2019 Providers:                Kathi Der, MD, Martha Clan, RN,                            Lorenza Evangelist, RN, Leanne Lovely, Technician,                            Maricela Curet, CRNA Referring MD:              Medicines:                Sedation Administered by an Anesthesia Professional Complications:            No immediate complications. Estimated Blood Loss:     Estimated blood loss was minimal. Procedure:                Pre-Anesthesia Assessment:                           - Prior to the procedure, a History and Physical                            was performed, and patient medications and                            allergies were reviewed. The patient's tolerance of                            previous anesthesia was also reviewed. The risks                            and benefits of the procedure and the sedation                            options and risks were discussed with the patient.                            All questions were answered, and informed consent                            was obtained. Prior Anticoagulants: The patient has  taken no anticoagulant or antiplatelet agents. ASA                            Grade Assessment: III - A patient with severe                            systemic disease. After reviewing the risks and                            benefits, the patient was deemed in satisfactory                            condition to undergo the procedure.                            After obtaining informed consent, the colonoscope                            was passed under direct vision. Throughout the                            procedure, the patient's blood pressure, pulse, and                            oxygen saturations were monitored continuously. The                            [id number]-HQ190L ([id number] Olympus colonoscope was                            introduced through the anus and advanced to the the                            cecum, identified by appendiceal orifice and                            ileocecal valve. The colonoscopy was performed                            without difficulty. The patient tolerated the                            procedure well. The quality of the bowel                            preparation was adequate to identify polyps greater                            than 5 mm in size. Scope In: 10:20:32 AM Scope Out: 10:33:40 AM Scope Withdrawal Time: 0 hours 9 minutes 17 seconds  Total Procedure Duration: 0 hours 13 minutes 8 seconds  Findings:      Hemorrhoids were found on perianal exam.      A single medium-sized localized angioectasia without bleeding was found  at the hepatic flexure.      A 6 mm polyp was found in the descending colon. The polyp was sessile.       The polyp was removed with a cold snare. Resection and retrieval were       complete.      Internal hemorrhoids were found during retroflexion. The hemorrhoids       were small. Impression:               - Hemorrhoids found on perianal exam.                           - A single non-bleeding colonic angioectasia.                           - One 6 mm polyp in the descending colon, removed                            with a cold snare. Resected and retrieved.                           - Internal hemorrhoids. Moderate Sedation:      Moderate (conscious) sedation was personally administered by an       anesthesia professional. The following parameters were monitored:  oxygen       saturation, heart rate, blood pressure, and response to care. Recommendation:           - Patient has a contact number available for                            emergencies. The signs and symptoms of potential                            delayed complications were discussed with the                            patient. Return to normal activities tomorrow.                            Written discharge instructions were provided to the                            patient.                           - Resume previous diet.                           - Continue present medications.                           - Await pathology results.                           - Repeat colonoscopy in 3 - 5 years for                            surveillance  based on pathology results.                           - Return to my office as previously scheduled. Procedure Code(s):        --- Professional ---                           [phone], Colonoscopy, flexible; with removal of                            tumor(s), polyp(s), or other lesion(s) by snare                            technique Diagnosis Code(s):        --- Professional ---                           Z86.010, Personal history of colonic polyps                           K55.20, Angiodysplasia of colon without hemorrhage                           D12.4, Benign neoplasm of descending colon                           K64.8, Other hemorrhoids CPT copyright 2022 [employer]. All rights reserved. The codes documented in this report are preliminary and upon coder review may  be revised to meet current compliance requirements. Kathi Der, MD Kathi Der, MD 06/23/2022 10:42:31 AM Number of Addenda: 0

## 2022-06-24 LAB — SURGICAL PATHOLOGY

## 2022-06-25 ENCOUNTER — Encounter (HOSPITAL_COMMUNITY): Payer: Self-pay | Admitting: Gastroenterology

## 2022-11-12 NOTE — Progress Notes (Addendum)
 Hunter Gray  MRN: [id number] DOB: [birth year]  PCP: Lauraine Patient, PA   Assessment and Plan :   1. Annual physical exam      2. Fatty liver  Hepatic Function Panel   Hepatic Function Panel    3. Chronic right-sided thoracic back pain      4. Cirrhosis of liver without ascites, unspecified hepatic cirrhosis type (*)  Hepatic Function Panel   US  Liver   AFP Tumor Marker   Hepatic Function Panel   AFP Tumor Marker    5. Type 2 diabetes mellitus with diabetic polyneuropathy, without long-term current use of insulin  (*)  POCT A1C   Basic Metabolic Panel   Lipid Panel With LDL/HDL Ratio   Vitamin B12 and Folate   POCT ACR URINE   Basic Metabolic Panel   Lipid Panel With LDL/HDL Ratio   Vitamin B12 and Folate   POCT ACR URINE   POCT A1C    6. Thrombocytopenia (*)  CBC And Differential   CBC And Differential    7. Esophageal varices without bleeding, unspecified esophageal varices type (*)      8. Vitamin D deficiency  Vitamin D 25 Hydroxy   Vitamin D 25 Hydroxy    9. Microalbuminuria due to type 2 diabetes mellitus (*)      10. Screening for prostate cancer  PSA   PSA    11. Postherpetic neuralgia      12. Screening for thyroid disorder  TSH Rfx on Abnormal to Free T4   TSH Rfx on Abnormal to Free T4    13. Screening for blood or protein in urine  Urinalysis   Urinalysis    14. Onychomycosis       Anticipatory guidance.  HM items addressed and appropriately ordered. Labs ordered, medications will be adjusted or initiated as indicated by results and communicated with patient by their desired method.  Patient to continue follow-up with his specialist.  Continue gabapentin  at same dosages.Patient has bilateral onychomycosis on great toenails due to liver issues will remove at future appointment.    Follow up for toenail removal and shoulder injection - right , wait list.  Risks, benefits, and alternatives of the medications and treatment plan prescribed today  were discussed, and patient expressed understanding. Answered all questions and addressed all concerns to the patient's satisfaction.  Plan follow-up as discussed or as needed if any worsening symptoms or change in condition.  Patient voiced understanding of the treatment plan and agreed to attempt to comply.  Designer, fashion/clothing may have been used to create parts of the visit note. Consent from the patient/caregiver was obtained prior to its use.  See after visit summary for patient specific instructions.    Patient's Medications       * Accurate as of November 12, 2022 11:59 PM. Reflects encounter med changes as of last refresh          Continued Medications      Instructions  atorvastatin calcium 10 mg tablet Commonly known as: LIPITOR  No dose, route, or frequency recorded.   DEXCOM G6 SENSOR Misc  Change every 10 days.   fluticasone  propionate 50 mcg/actuation nasal spray Commonly known as: FLONASE   1 spray, Nasal, 2 times a day as needed   gabapentin  600 mg tablet Commonly known as: NEURONTIN   TAKE 2 TABLETS BY MOUTH TWICE DAILY ,  MAY  ADD  AN  ADDITIONAL  2  TABLETS  IN  THE  MIDDLE  OF  THE  NIGHT  IF  NEEDED   HYDROcodone-homatropine 5-1.5 mg/5 mL solution Commonly known as: HYCODAN,HYDROMET  5 mLs, Oral, Every 8 hours as needed, Do not take at the same time as [employer].   losartan potassium 25 mg tablet Commonly known as: COZAAR  25 mg, Oral, Daily, For kidney function   midodrine  HCl 5 mg tablet Commonly known as: PROAMATINE   5 mg, Oral, 3 times a day   MOUNJARO 7.5 mg/0.5 mL Soaj Generic drug: tirzepatide  7.5 mg, Subcutaneous, Weekly   nadolol  20 MG tablet Commonly known as: CORGARD   20 mg, Oral, Daily   pantoprazole  sodium 40 mg tablet Commonly known as: PROTONIX   40 mg, Oral, Daily as needed   SYNJARDY  XR 05-998 MG Tb24 Generic drug: Empagliflozin -metFORMIN  HCl ER  2 tablets, Oral, Every morning   VICTOZA  18 MG/3ML  injection Generic drug: liraglutide   1.8 mg, Subcutaneous, Daily        Subjective:   Chief Complaint  Patient presents with  . Annual Exam    Pt presents to clinic for a CPE.   Colorectal Cancer Screening: 2022 - 10 yr recall Prostate Screening: Request PSA today  Dental exam: not regularly Last vision exam: wears glasses - yearly  Typical meals for patient: 2 meals - combo - not fast food Typical beverage choices: water, diet MT dew - 12 oz daily Exercises: active at job- no formal exercise regularly - 5d month to gym Sleeps: 6 hrs per night and sleeping well   Problems that patient would like to have address outside of the scope of an annual exam No  Right shoulder - thinks it is bothering him at night - arm getting more weaker - right handed - been going on for 6 months - stable - trying rubs but not much luck  Has liver specialist - 6/24 last endoscopy and colonoscopy  Has endocrinologist-sees regularly for diabetic control  Patient Active Problem List  Diagnosis  . Fatty liver  . RUQ pain  . Chronic right-sided thoracic back pain  . Liver cirrhosis (*) -has regular follow-up with GI  . Seasonal allergic rhinitis due to pollen  . Type 2 diabetes mellitus with diabetic polyneuropathy, without long-term current use of insulin  (*)  . Esophageal varices (*) -sees GI yearly no changes per recent endoscopy  . Thrombocytopenia (*) -no recent changes  . Abducens nerve palsy  . Abnormal levels of other serum enzymes  . History of adenomatous polyp of colon  . Hiatal hernia  . Hyperglycemia due to type 2 diabetes mellitus (*)  . Obesity  . Postherpetic neuralgia -gabapentin  is helping control his symptoms  . Sciatica, left side  . Vitamin D deficiency  . Type 2 diabetes mellitus with mild nonproliferative diabetic retinopathy without macular edema, bilateral (*) -sees endocrinology  . Microalbuminuria due to type 2 diabetes mellitus (*) -sees endocrinology     History was obtained from patient.  Vaccinations: Immunization History  Administered Date(s) Administered  . Hep A / Hep B 10/11/2017, 11/11/2017, 07/06/2018  . Influenza Quad Pfree 11/29/2019    Patient Care Team: Lauraine Patient, PA as PCP - General (Physician Assistant) Trenia Lah (Internal Medicine) Reyes GORMAN Alexander, MD (Internal Medicine)  Review of Systems  Constitutional: Negative.  Negative for activity change, appetite change and diaphoresis.  HENT: Negative.    Eyes: Negative.  Negative for visual disturbance.  Respiratory: Negative.  Negative for chest tightness and shortness of breath.   Cardiovascular: Negative.  Negative for chest pain, palpitations  and leg swelling.  Gastrointestinal: Negative.   Endocrine: Negative.  Negative for polydipsia and polyuria.  Genitourinary: Negative.   Musculoskeletal: Negative.   Skin: Negative.  Negative for wound.  Allergic/Immunologic: Negative.   Neurological: Negative.  Negative for dizziness, light-headedness, numbness (or paresthesias of feet) and headaches.  Hematological: Negative.   Psychiatric/Behavioral: Negative.         Patient's Medications       * Accurate as of November 12, 2022 11:59 PM. Reflects encounter med changes as of last refresh          Continued Medications      Instructions  atorvastatin calcium 10 mg tablet Commonly known as: LIPITOR  No dose, route, or frequency recorded.   DEXCOM G6 SENSOR Misc  Change every 10 days.   fluticasone  propionate 50 mcg/actuation nasal spray Commonly known as: FLONASE   1 spray, Nasal, 2 times a day as needed   gabapentin  600 mg tablet Commonly known as: NEURONTIN   TAKE 2 TABLETS BY MOUTH TWICE DAILY ,  MAY  ADD  AN  ADDITIONAL  2  TABLETS  IN  THE  MIDDLE  OF  THE  NIGHT  IF  NEEDED   HYDROcodone-homatropine 5-1.5 mg/5 mL solution Commonly known as: HYCODAN,HYDROMET  5 mLs, Oral, Every 8 hours as needed, Do not take at the same time as  [employer].   losartan potassium 25 mg tablet Commonly known as: COZAAR  25 mg, Oral, Daily, For kidney function   midodrine  HCl 5 mg tablet Commonly known as: PROAMATINE   5 mg, Oral, 3 times a day   MOUNJARO 7.5 mg/0.5 mL Soaj Generic drug: tirzepatide  7.5 mg, Subcutaneous, Weekly   nadolol  20 MG tablet Commonly known as: CORGARD   20 mg, Oral, Daily   pantoprazole  sodium 40 mg tablet Commonly known as: PROTONIX   40 mg, Oral, Daily as needed   SYNJARDY  XR 05-998 MG Tb24 Generic drug: Empagliflozin -metFORMIN  HCl ER  2 tablets, Oral, Every morning   VICTOZA  18 MG/3ML injection Generic drug: liraglutide   1.8 mg, Subcutaneous, Daily         Allergies  Allergen Reactions  . Glimepiride Other    Gas and weight gain  . Cymbalta  [Duloxetine  Hcl] Nausea Only    Generic caused side effects      Past Surgical History:  Procedure Laterality Date  . Shoulder surgery Left     Family History  Problem Relation Age of Onset  . Diabetes Mother   . Heart disease Mother   . Diabetes Father   . No Known Problems Sister     Social Determinants of Health   Tobacco Use: Medium Risk (11/12/2022)   Patient History   . Smoking Tobacco Use: Former   . Smokeless Tobacco Use: Never   . Passive Exposure: Never  Alcohol Use: Not on file  Financial Resource Strain: Low Risk  (11/12/2022)   Overall Financial Resource Strain (CARDIA)   . Difficulty of Paying Living Expenses: Not hard at all  Food Insecurity: No Food Insecurity (11/12/2022)   Hunger Vital Sign   . Worried About Programme researcher, broadcasting/film/video in the Last Year: Never true   . Ran Out of Food in the Last Year: Never true  Transportation Needs: No Transportation Needs (11/12/2022)   PRAPARE - Transportation   . Lack of Transportation (Medical): No   . Lack of Transportation (Non-Medical): No  Physical Activity: Not on file  Stress: Not on file  Social Connections: Unknown (05/09/2021)   Social  Network   . Social  Network: Not on file  Intimate Partner Violence: Unknown (04/07/2021)   HITS   . Physically Hurt: Not on file   . Insult or Talk Down To: Not on file   . Threaten Physical Harm: Not on file   . Scream or Curse: Not on file  Depression: Not at risk (11/12/2022)   Depression   . Depression Screening: 0  Housing Stability: Low Risk  (11/12/2022)   Housing Stability Vital Sign   . Unable to Pay for Housing in the Last Year: No   . Number of Places Lived in the Last Year: 1   . Unstable Housing in the Last Year: No  Utilities: Not At Risk (11/12/2022)   [facility] Utilities   . Threatened with loss of utilities: No     Objective:  BP 116/78 (BP Location: Left Upper Arm, Patient Position: Sitting)   Pulse 73   Temp 98.2 F (36.8 C) (Temporal)   Resp 17   Ht 5' 3 (1.6 m)   Wt 189 lb 3.2 oz (85.8 kg)   SpO2 98%   BMI 33.52 kg/m   Wt Readings from Last 3 Encounters:  11/12/22 189 lb 3.2 oz (85.8 kg)  07/31/21 194 lb 6.4 oz (88.2 kg)  03/13/21 196 lb 6.4 oz (89.1 kg)    No results found.  Physical Exam Vitals and nursing note reviewed. Exam conducted with a chaperone present.  Constitutional:      Appearance: Normal appearance.  HENT:     Head: Normocephalic and atraumatic.     Right Ear: Hearing, tympanic membrane, ear canal and external ear normal. There is no impacted cerumen.     Left Ear: Hearing, tympanic membrane, ear canal and external ear normal. There is no impacted cerumen.     Nose: Nose normal. No congestion or rhinorrhea.     Mouth/Throat:     Mouth: Mucous membranes are moist.     Pharynx: Oropharynx is clear. No oropharyngeal exudate or posterior oropharyngeal erythema.  Eyes:     Extraocular Movements: Extraocular movements intact.     Conjunctiva/sclera: Conjunctivae normal.     Pupils: Pupils are equal, round, and reactive to light.  Neck:     Thyroid: No thyroid mass or thyromegaly.     Trachea: Trachea and phonation normal.  Cardiovascular:     Rate and  Rhythm: Normal rate and regular rhythm.     Heart sounds: Normal heart sounds. No murmur heard. Pulmonary:     Effort: Pulmonary effort is normal.     Breath sounds: Normal breath sounds.  Abdominal:     General: Abdomen is flat. Bowel sounds are normal.     Palpations: Abdomen is soft.  Musculoskeletal:        General: Normal range of motion.     Cervical back: Normal range of motion and neck supple.  Lymphadenopathy:     Cervical:     Right cervical: No superficial or posterior cervical adenopathy.    Left cervical: No superficial or posterior cervical adenopathy.  Skin:    General: Skin is warm and dry.     Capillary Refill: Capillary refill takes less than 2 seconds.     Comments: Diabetic foot exam:  Left: Monofilament test: Sensation normal  Pulses: present  Skin: Normal and no erythema, no cyanosis or pallor   Other findings: none Right: Monofilament test: Sensation normal  Pulses: present  Skin: Normal and no erythema, no cyanosis or pallor  Other findings: none Exam performed with shoes and socks removed.    Neurological:     General: No focal deficit present.     Mental Status: He is alert and oriented to person, place, and time.     Sensory: Sensation is intact.     Motor: Motor function is intact.     Gait: Gait normal.     Deep Tendon Reflexes: Reflexes are normal and symmetric.  Psychiatric:        Mood and Affect: Mood normal.        Behavior: Behavior normal.        Thought Content: Thought content normal.        Judgment: Judgment normal.        Lauraine Allen DEVONNA [facility]  11/12/2022       *Some images could not be shown.

## 2022-12-18 NOTE — Progress Notes (Signed)
 Patient: Hunter Gray  DOB: [birth year], age 52 y.o. MRN: [id number]  PCP: Lauraine Patient, PA  Assessment and Plan   1. Chronic right shoulder pain  triamcinolone acetonide (KENALOG-40) 40 mg/mL injection 40 mg    2. Onychomycosis      3. Pain in toes of both feet  traMADol  (ULTRAM ) 50 mg tablet     Right shoulder injection with some pain relief.  Bilateral great toenails removed, wound care discussed with patient  Follow up if symptoms worsen or fail to improve.   Risks, benefits, and alternatives of the medications and treatment plan prescribed today were discussed, and patient expressed understanding. Answered all questions and addressed all concerns to the patient's satisfaction.  Plan follow-up as discussed or as needed if any worsening symptoms or change in condition.  Patient voiced understanding of the treatment plan and agreed to attempt to comply.  Designer, fashion/clothing may have been used to create parts of the visit note. Consent from the patient/caregiver was obtained prior to its use.  See after visit summary for patient specific instructions.    Subjective   Hunter Gray is a 52 y.o. male who presents with:     Patient presents with  . Right shoulder pain  . Toenail removal     Pt presents to clinic for continued right shoulder pain and bilateral onychomycosis.  Patient presents for shoulder injection and removal of both toenails as he is not a candidate for oral medications.  Continues to wake him up during the night due to pain.  History was obtained from patient.   Reviewed and updated this visit by provider: Tobacco  Allergies  Meds  Problems  Med Hx  Surg Hx  Fam Hx        Review of Systems  Musculoskeletal:  Positive for arthralgias (Right shoulder).        Objective   Vitals:   12/18/22 1541  BP: 132/84  Pulse: 89  Temp: 97.2 F (36.2 C)  TempSrc: Temporal  Resp: 16  Height: 5' 3 (1.6 m)  Weight: 193  lb (87.5 kg)  SpO2: 97%  BMI (Calculated): 34.2    Physical Exam Vitals and nursing note reviewed.  Constitutional:      Appearance: Normal appearance.  HENT:     Head: Normocephalic and atraumatic.     Right Ear: External ear normal.     Left Ear: External ear normal.     Nose: Nose normal.  Eyes:     Conjunctiva/sclera: Conjunctivae normal.  Musculoskeletal:     Cervical back: Normal range of motion.     Comments: Right shoulder-anterior and lateral shoulder joint pain.  Positive empty can and positive impingement testing.  Pulmonary:     Effort: Pulmonary effort is normal.  Skin:    General: Skin is warm and dry.     Comments: Bilateral onychomycosis great nails on feet  Neurological:     Mental Status: He is alert and oriented to person, place, and time.     Gait: Gait normal.  Psychiatric:        Mood and Affect: Mood normal.        Behavior: Behavior normal.        Thought Content: Thought content normal.        Judgment: Judgment normal.     Large joint injection/arthrocentesis:  Date/Time: 12/18/2022, 5:24 PM Consent given by: patient Timeout: Immediately prior to procedure a time out was called to verify the  correct patient, procedure, equipment, support staff and site/side marked as required  Supporting Documentation Indications: pain  Procedure Details Location: Right posterior shoulder Preparation: Patient was prepped and draped in the usual sterile fashion  Approach: Posterior Medications administered: 40 mg kenalog mixed with lidocaine  plain Patient tolerance: patient tolerated the procedure well with no immediate complications   Procedure: Verbal and written consent obtained.  Digital block with 2% lidocaine  one-to-one mixture with Marcaine.  Betadine prep to both great toes.  Both right and left nail plates removed.  Xeroform gauze placed on nailbeds along with bulky dressing.    Lauraine Allen DEVONNA [facility]  12/18/2022

## 2022-12-18 NOTE — Patient Instructions (Signed)
INGROWN TOENAIL . Keep area clean, dry and bandaged for 24 hours. . After 24 hours, remove outer bandage and leave yellow gauze in place. . Soak toe/foot in warm soapy water for 5-10 minutes, once daily for 5 days. Rebandage toe after each cleaning. . Continue soaks until yellow gauze falls off. . Notify the office if you experience any of the following signs of infection: Swelling, redness, pus drainage, streaking, fever > 101.0 F   

## 2023-10-06 ENCOUNTER — Other Ambulatory Visit: Payer: Self-pay | Admitting: Gastroenterology

## 2023-10-12 NOTE — Progress Notes (Signed)
 Liver Clinic Follow Up Note  Identification: Hunter Gray is a 54 y.o. male with history of type 2 diabetes (A1c 7.3), cirrhosis secondary to [facility].  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   Patient reports multiple years of a fatty liver and was found to have elevated LFTs  while undergoing work-up for neuropathic pain.  He was referred to gastroenterologist found to have cirrhosis.  He reports drinking 1-2 times per month but not regularly.  Per notes, underwent prior work-up in October 2019 which showed unremarkable ceruloplasmin, iron studies (other than mildly elevated ferritin, negative HFE testing), AMA, ASMA.  He reports he stopped drinking alcohol at the time of his diagnosis.  He was told to control his diabetes (highest A1c 12) and otherwise would undergo monitoring.   He was otherwise asymptomatic from his liver disease until he presented in May 2021 with multiple episodes of hematemesis and melena.  He underwent upper endoscopy showed grade 2 esophageal varices, nonbleeding gastric ulcer, portal hypertensive gastropathy.  He underwent banding at that time.    Patient denies any prior history of hepatic encephalopathy or ascites.   Interval History:  History of Present Illness  Hunter Gray was last seen in clinic in April 2025. He has done well since last visit. He continues on Mounjaro and Synjardy  for diabetes control, with last A1C 5.5%. He endorses some stress in his life balancing work and taking care of 4 kids at home as well as his parents who have relocated to be with him in [place].  He is doing well from a symptom standpoint and denies any issues with jaundice, HE, ascites, LE edema, or GI bleeding. His last endoscopy was performed on June 29, 2022, and showed three columns of small varices with no bleeding,  and a repeat was recommended in one year. He says this is scheduled locally for later this month. He is continuing to work doing Engineer, maintenance. He is not drinking any alcohol.  Past Medical History: Past Medical History:  Diagnosis Date  . Cirrhosis (CMS/HHS-HCC)   . Liver disease   . Systemic hypertension   . Type 2 diabetes mellitus (CMS/HHS-HCC)     Past Surgical History: History reviewed. No pertinent surgical history.  Medications: Outpatient Medications Marked as Taking for the 10/12/23 encounter (Office Visit) with Myrna Manuelita Longs, MD  Medication Sig Dispense Refill  . acetaminophen (TYLENOL) 500 MG tablet Take 500-1,000 mg by mouth 2 (two) times daily as needed As needed    . empagliflozin -metformin  (SYNJARDY  XR) 25-1,000 mg XR 24 hr biphasic tablet Take 1 tablet by mouth once daily    . fluticasone  propionate (FLONASE ) 50 mcg/actuation nasal spray Place 2 sprays into both nostrils as needed    . gabapentin  (NEURONTIN ) 600 MG tablet Take 600 mg by mouth 4 (four) times daily as needed    . losartan (COZAAR) 25 MG tablet Take 25 mg by mouth once daily    . MOUNJARO 5 mg/0.5 mL pen injector Inject 7.5 mg subcutaneously once a week    . pantoprazole  (PROTONIX ) 40 MG DR tablet Take 40 mg by mouth once daily as needed      Allergies: Allergies  Allergen Reactions  . Glimepiride Unknown  . 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  Family History: Family  history notable for an uncle with cirrhosis, thought secondary to alcohol.  Review of symptoms: A 10 point review of symptoms was performed and was negative except as delineated in the HPI above.  Physical Examination: Vitals:   10/12/23 1127  BP: 113/72  Pulse: 65  Temp: 36.6 C (97.8 F)    General:  The patient is awake, alert, oriented & in no acute distress.  The mood & affect are noted to be appropriate. Head & Neck:  The sclera are  not injected nor icteric.   Lungs:  Full & clear bilaterally CV:  Regular without extra heart sounds Abdomen: The abdomen is soft, non tender, non distended, positive bowel sounds. There is no ascites.   Extremities:  There is no pretibial edema Skin:  There is no rash.  Neuro:  Grossly intact.  The patient is able to stand easily.  No assistance was needed on or off of the exam table.  Affect and speech normal.    Labs & Studies: Office Visit on 10/12/2023  Component Date Value  . WBC (White Blood Cell Co* 10/12/2023 5.1   . Hemoglobin 10/12/2023 15.3   . Hematocrit 10/12/2023 44.7   . Platelets 10/12/2023 94 (L)   . MCV (Mean Corpuscular Vo* 10/12/2023 94   . MCH (Mean Corpuscular He* 10/12/2023 32.0   . MCHC (Mean Corpuscular H* 10/12/2023 34.2   . RBC (Red Blood Cell Coun* 10/12/2023 4.78   . RDW-CV (Red Cell Distrib* 10/12/2023 12.8   . NRBC (Nucleated Red Bloo* 10/12/2023 0.00   . NRBC % (Nucleated Red Bl* 10/12/2023 0.0   . MPV (Mean Platelet Volum* 10/12/2023 10.4   . Immature Platelet Fracti* 10/12/2023 2.3   . Slide Review/Morphology 10/12/2023 Yes   . Sodium 10/12/2023 142   . Potassium 10/12/2023 4.1   . Chloride 10/12/2023 104   . Carbon Dioxide (CO2) 10/12/2023 23   . Urea Nitrogen (BUN) 10/12/2023 9   . Creatinine 10/12/2023 1.0   . Glucose 10/12/2023 91   . Calcium 10/12/2023 10.0   . AST (Aspartate Aminotran* 10/12/2023 46 (H)   . ALT (Alanine Aminotransf* 10/12/2023 44   . Bilirubin, Total 10/12/2023 1.1   . Alk Phos (Alkaline Phosp* 10/12/2023 59   . Albumin 10/12/2023 3.8   . Protein, Total 10/12/2023 7.6   . Anion Gap 10/12/2023 15 (H)   . BUN/CREA Ratio 10/12/2023 9   . Glomerular Filtration Ra* 10/12/2023 90   . Prothrombin Time 10/12/2023 13.7 (H)   . Prothrombin INR 10/12/2023 1.2 (H)   . Alpha Fetoprotein (AFP) 10/12/2023 3.0    MELD 3.0: 8 at 10/12/2023 12:25 PM MELD-Na: 9 at 10/12/2023 12:25 PM Calculated from: Serum Creatinine: 1 mg/dL at  89/02/7972 87:74 PM Serum Sodium: 142 mmol/L (Using max of 137 mmol/L) at 10/12/2023 12:25 PM Total Bilirubin: 1.1 mg/dL at 89/02/7972 87:74 PM Serum Albumin: 3.8 g/dL (Using max of 3.5 g/dL) at 89/02/7972 87:74 PM INR(ratio): 1.2 at 10/12/2023 12:25 PM Age at listing (hypothetical): 52 years Sex: Male at 10/12/2023 12:25 PM    Assessment & Plan: Hunter Gray is a 53 y.o. male with history of type 2 diabetes , cirrhosis secondary to nonalcoholic steatohepatitis and distant alcohol use who presents for follow-up of his liver disease.  We spent much of our encounter today discussing steps that he can take to mitigate progression of his liver disease and focused primarily on weight loss, and diabetic control.   He is not drinking alcohol and his diabetes has  been well controlled with addition of Mounjaro and Synjardy .  He is due for upper endoscopy, and also for hepatoma screening.  The EGD is being organized by his local gastroenterologist, Dr. Elicia - he is continuing with EGD for variceal surveillance due to inability to tolerate NSBB in setting of bradycardia. He was to have CT scan today for [facility] surveillance but did not make it in time for this appointment so we have recommended rescheduling this at first availability, and RTC in ~7 months with clinic visit and US  that day.  I shared with the patient that his liver disease still remains well compensated, and that there is no indication for transplantation at the present time. Assessment & Plan Cirrhosis secondary to MASH Cirrhosis secondary to MASH with a history of diabetes. The liver has recompensated with diabetes control and alcohol cessation. No current need for liver transplant as liver function is adequate. The risk of liver cancer is 1-2% per year, necessitating regular HCC screenings. - Reschedule CT cirrhosis protocol at next availability (missed appt today with Radiology). AFP with labs - Schedule follow-up appointment in ~7 months  with ultrasound - Check blood work including MELD score and alpha fetoprotein today - EGD for variceal surveillance as below - Discussed importance of ongoing control of metabolic risk factors, avoidance of alcohol  Esophageal varices Bleeding esophageal varices previously treated with banding. Last endoscopy in June 2024 showed three columns of small varices with no bleeding. Nadolol  was prescribed to prevent bleeding, but he has history of bradycardia which is felt prohibitive to him continuing NSBB safely. He is due for EGD for variceal surveillance which will be completed locally at the end of the month. - Repeat EGD locally with Dr. Denita in October 2025  Diabetes mellitus Diabetes is well-controlled with Mounjaro and Synjardy .  Hypertension with kidney involvement Hypertension managed with losartan, beneficial for kidney health. He is not currently on nadolol , previously prescribed for varices.  Colonic polyps Colonic polyps with last colonoscopy in June 2024 showing a 6 mm polyp, hemorrhoids, and angioectasia. Pathology showed mild superficial hyperplastic change, not adenomatous. Recommended follow-up colonoscopy in five years given prior hx per report.  HCM Reports up to date with HAV and HBV vaccines  Follow-up - Schedule follow-up appointment in 6-7 months with ultrasound   Toribio Jaeger, MD Transplant Hepatology / GI Fellow, PGY6   Attestation Statement:   I personally saw and evaluated the patient, and participated in the management and treatment plan as documented in the resident/fellow note. Overall doing well. MELD Low on labs. CT scan being arranged as missed earlier appt. Will continue biannual f/u. Sinus bradycardia on last egd of nsbb so not on NSBB  LINDSAY SAMMUEL NOVAK, MD

## 2023-10-12 NOTE — Progress Notes (Signed)
 VSS on room air. Ambulating independently. Denies pain. Declines flu vaccine today. Looks well.

## 2023-10-13 NOTE — Telephone Encounter (Addendum)
 Outgoing call was placed to the patient to relay the provider's message regarding recent results. The patient was informed that the CT scan did not reveal any concerning liver lesions but did show the presence of varices. He was advised to proceed with the planned EGD as scheduled. Additionally, he was notified that his labs remain stable and that his current MELD score is 8.

## 2023-10-19 ENCOUNTER — Encounter (HOSPITAL_COMMUNITY): Payer: Self-pay | Admitting: Gastroenterology

## 2023-10-19 NOTE — Progress Notes (Signed)
 Attempted to obtain medical history for pre op call via telephone, unable to reach at this time. HIPAA compliant voicemail message left requesting return call to pre surgical testing department.

## 2023-10-25 ENCOUNTER — Encounter (HOSPITAL_COMMUNITY): Payer: Self-pay | Admitting: Physician Assistant

## 2023-10-25 NOTE — Progress Notes (Signed)
 Pre call attempted again for procedure tomorrow 10/21. Updated medicines and patient informed me he was on Mounjaro and it was last taken Friday 10/17. I asked our anesthesia dept if they would still proceed and possibly do him under general anesthesia but they did not approve and stated he will need to be full 7 days off. I left vm for Dr Josefina nurse and also lvm for patient that would be cancelled for 10/21 and the office would call him with reschedule date.

## 2023-10-26 ENCOUNTER — Encounter (HOSPITAL_COMMUNITY): Admission: RE | Payer: Self-pay | Source: Home / Self Care

## 2023-10-26 ENCOUNTER — Ambulatory Visit (HOSPITAL_COMMUNITY): Admission: RE | Admit: 2023-10-26 | Source: Home / Self Care | Admitting: Gastroenterology

## 2023-10-26 SURGERY — EGD (ESOPHAGOGASTRODUODENOSCOPY)
Anesthesia: Monitor Anesthesia Care

## 2023-11-03 ENCOUNTER — Encounter (HOSPITAL_COMMUNITY): Payer: Self-pay | Admitting: Gastroenterology

## 2023-11-03 NOTE — Progress Notes (Signed)
Attempted to obtain medical history via telephone, unable to reach at this time. Unable to leave voicemail to return pre surgical testing department's phone call,due to mailbox full.  

## 2023-11-09 ENCOUNTER — Ambulatory Visit (HOSPITAL_COMMUNITY): Admitting: Anesthesiology

## 2023-11-09 ENCOUNTER — Other Ambulatory Visit: Payer: Self-pay

## 2023-11-09 ENCOUNTER — Encounter (HOSPITAL_COMMUNITY)
Admission: RE | Disposition: A | Discharge status: Home / Self Care | Payer: Self-pay | Source: Home / Self Care | Attending: Gastroenterology

## 2023-11-09 ENCOUNTER — Ambulatory Visit (HOSPITAL_COMMUNITY)
Admission: RE | Admit: 2023-11-09 | Discharge: 2023-11-09 | Disposition: A | Discharge status: Home / Self Care | Attending: Gastroenterology | Admitting: Gastroenterology

## 2023-11-09 DIAGNOSIS — I8510 Secondary esophageal varices without bleeding: Secondary | ICD-10-CM | POA: Insufficient documentation

## 2023-11-09 DIAGNOSIS — E119 Type 2 diabetes mellitus without complications: Secondary | ICD-10-CM | POA: Insufficient documentation

## 2023-11-09 DIAGNOSIS — Z7985 Long-term (current) use of injectable non-insulin antidiabetic drugs: Secondary | ICD-10-CM | POA: Insufficient documentation

## 2023-11-09 DIAGNOSIS — I10 Essential (primary) hypertension: Secondary | ICD-10-CM | POA: Insufficient documentation

## 2023-11-09 DIAGNOSIS — K2970 Gastritis, unspecified, without bleeding: Secondary | ICD-10-CM | POA: Insufficient documentation

## 2023-11-09 DIAGNOSIS — Z87891 Personal history of nicotine dependence: Secondary | ICD-10-CM | POA: Insufficient documentation

## 2023-11-09 DIAGNOSIS — K2100 Gastro-esophageal reflux disease with esophagitis, without bleeding: Secondary | ICD-10-CM | POA: Insufficient documentation

## 2023-11-09 DIAGNOSIS — K7460 Unspecified cirrhosis of liver: Secondary | ICD-10-CM | POA: Insufficient documentation

## 2023-11-09 DIAGNOSIS — Z7984 Long term (current) use of oral hypoglycemic drugs: Secondary | ICD-10-CM | POA: Insufficient documentation

## 2023-11-09 HISTORY — PX: ESOPHAGOGASTRODUODENOSCOPY: SHX5428

## 2023-11-09 LAB — GLUCOSE, CAPILLARY: Glucose-Capillary: 90 mg/dL (ref 70–99)

## 2023-11-09 SURGERY — EGD (ESOPHAGOGASTRODUODENOSCOPY)
Anesthesia: Monitor Anesthesia Care

## 2023-11-09 MED ORDER — CARVEDILOL 3.125 MG PO TABS: 3.1250 mg | ORAL_TABLET | Freq: Two times a day (BID) | ORAL | 11 refills | Status: AC

## 2023-11-09 MED ORDER — PANTOPRAZOLE SODIUM 40 MG PO TBEC: 40.0000 mg | DELAYED_RELEASE_TABLET | Freq: Every day | ORAL | 1 refills | Status: AC

## 2023-11-09 MED ORDER — PROPOFOL 500 MG/50ML IV EMUL
INTRAVENOUS | Status: DC | PRN
  Administered 2023-11-09: 160 mg via INTRAVENOUS
  Administered 2023-11-09: 30 mg via INTRAVENOUS
  Administered 2023-11-09 (×2): 40 mg via INTRAVENOUS

## 2023-11-09 MED ORDER — LIDOCAINE 2% (20 MG/ML) 5 ML SYRINGE
INTRAMUSCULAR | Status: DC | PRN
Start: 2023-11-09 — End: 2023-11-09
  Administered 2023-11-09: 50 mg via INTRAVENOUS

## 2023-11-09 MED ORDER — PROPOFOL 500 MG/50ML IV EMUL
INTRAVENOUS | Status: AC
  Filled 2023-11-09: qty 50

## 2023-11-09 MED ORDER — PHENYLEPHRINE 80 MCG/ML (10ML) SYRINGE FOR IV PUSH (FOR BLOOD PRESSURE SUPPORT)
PREFILLED_SYRINGE | INTRAVENOUS | Status: DC | PRN
  Administered 2023-11-09 (×2): 80 ug via INTRAVENOUS

## 2023-11-09 MED ORDER — GLYCOPYRROLATE PF 0.2 MG/ML IJ SOSY
PREFILLED_SYRINGE | INTRAMUSCULAR | Status: DC | PRN
Start: 2023-11-09 — End: 2023-11-09
  Administered 2023-11-09: .1 mg via INTRAVENOUS

## 2023-11-09 MED ORDER — SODIUM CHLORIDE 0.9 % IV SOLN
INTRAVENOUS | Status: AC | PRN
  Administered 2023-11-09: 500 mL via INTRAMUSCULAR

## 2023-11-09 MED ORDER — SODIUM CHLORIDE 0.9 % IV SOLN: INTRAVENOUS | Status: DC

## 2023-11-09 NOTE — Anesthesia Procedure Notes (Signed)
 Procedure Name: MAC Date/Time: 11/09/2023 10:26 AM  Performed by: Erick Fitz, CRNAPre-anesthesia Checklist: Patient identified, Emergency Drugs available, Suction available, Patient being monitored and Timeout performed Patient Re-evaluated:Patient Re-evaluated prior to induction Oxygen Delivery Method: Circle system utilized Preoxygenation: Pre-oxygenation with 100% oxygen (POM mask utilized) Induction Type: IV induction Placement Confirmation: positive ETCO2 and CO2 detector Dental Injury: Teeth and Oropharynx as per pre-operative assessment

## 2023-11-09 NOTE — Anesthesia Postprocedure Evaluation (Signed)
 Anesthesia Post Note  Patient: Hunter Gray.  Procedure(s) Performed: EGD (ESOPHAGOGASTRODUODENOSCOPY)     Patient location during evaluation: Endoscopy Anesthesia Type: MAC Level of consciousness: awake and alert, patient cooperative and oriented Pain management: pain level controlled Vital Signs Assessment: post-procedure vital signs reviewed and stable Respiratory status: spontaneous breathing, nonlabored ventilation and respiratory function stable Cardiovascular status: blood pressure returned to baseline and stable Postop Assessment: no apparent nausea or vomiting Anesthetic complications: no   There were no known notable events for this encounter.  Last Vitals:  Vitals:   11/09/23 1107 11/09/23 1110  BP: (!) 100/50 (!) 114/92  Pulse: 66 66  Resp: 16 16  Temp:    SpO2: 93% 93%    Last Pain:  Vitals:   11/09/23 1110  TempSrc:   PainSc: 0-No pain                 Donelda Mailhot,E. Zerrick Hanssen

## 2023-11-09 NOTE — Discharge Instructions (Signed)
YOU HAD AN ENDOSCOPIC PROCEDURE TODAY: Refer to the procedure report and other information in the discharge instructions given to you for any specific questions about what was found during the examination. If this information does not answer your questions, please call [facility] office at [phone] to clarify.  ? ?YOU SHOULD EXPECT: Some feelings of bloating in the abdomen. Passage of more gas than usual. Walking can help get rid of the air that was put into your GI tract during the procedure and reduce the bloating.  ? ?DIET: Your first meal following the procedure should be a light meal and then it is ok to progress to your normal diet. A half-sandwich or bowl of soup is an example of a good first meal. Heavy or fried foods are harder to digest and may make you feel nauseous or bloated. Drink plenty of fluids but you should avoid alcoholic beverages for 24 hours.  ? ?ACTIVITY: Your care partner should take you home directly after the procedure. You should plan to take it easy, moving slowly for the rest of the day. You can resume normal activity the day after the procedure however YOU SHOULD NOT DRIVE, use power tools, machinery or perform tasks that involve climbing or major physical exertion for 24 hours (because of the sedation medicines used during the test).  ? ?SYMPTOMS TO REPORT IMMEDIATELY: ?A gastroenterologist can be reached at any hour. Please call [phone]  for any of the following symptoms:  ? ?Following upper endoscopy (EGD, EUS, ERCP, esophageal dilation) ?Vomiting of blood or coffee ground material  ?New, significant abdominal pain  ?New, significant chest pain or pain under the shoulder blades  ?Painful or persistently difficult swallowing  ?New shortness of breath  ?Black, tarry-looking or red, bloody stools ? ?FOLLOW UP:  ?If any biopsies were taken you will be contacted by phone or by letter within the next 1-3 weeks. Call [phone]  if you have not heard about the biopsies in 3 weeks.   ?Please also call with any specific questions about appointments or follow up tests. YOU HAD AN ENDOSCOPIC PROCEDURE TODAY: Refer to the procedure report and other information in the discharge instructions given to you for any specific questions about what was found during the examination. If this information does not answer your questions, please call [facility] office at [phone] to clarify.   ?

## 2023-11-09 NOTE — Op Note (Signed)
 [facility] Patient Name: Hunter Gray Procedure Date: 11/09/2023 MRN: [id number] Attending MD: Layla Lah , MD, [id number] Date of Birth: 13-Jul-1970 CSN: [id number] Age: 53 Admit Type: Outpatient Procedure:                Upper GI endoscopy Indications:              Follow-up of esophageal varices, For therapy of                            esophageal varices Providers:                Layla Lah, MD, Olam Riedel, RN, Janie Billups,                            Technician Referring MD:              Medicines:                Sedation Administered by an Anesthesia Professional Complications:            No immediate complications. Estimated Blood Loss:     Estimated blood loss was minimal. There was                            self-limited oozing immediately after band ligation                            but it stopped without any intervention. Procedure:                Pre-Anesthesia Assessment:                           - Prior to the procedure, a History and Physical                            was performed, and patient medications and                            allergies were reviewed. The patient's tolerance of                            previous anesthesia was also reviewed. The risks                            and benefits of the procedure and the sedation                            options and risks were discussed with the patient.                            All questions were answered, and informed consent                            was obtained. Prior Anticoagulants: The patient has  taken no anticoagulant or antiplatelet agents. ASA                            Grade Assessment: III - A patient with severe                            systemic disease. After reviewing the risks and                            benefits, the patient was deemed in satisfactory                            condition to undergo the procedure.                            After obtaining informed consent, the endoscope was                            passed under direct vision. Throughout the                            procedure, the patient's blood pressure, pulse, and                            oxygen saturations were monitored continuously. The                            [id number]-H190 ([id number]) Olympus endoscope was introduced                            through the mouth, and advanced to the second part                            of duodenum. The upper GI endoscopy was                            accomplished without difficulty. The patient                            tolerated the procedure well. Scope In: Scope Out: Findings:      Two columns of large (> 5 mm) varices with no bleeding and no stigmata       of recent bleeding were found in the lower third of the esophagus. No       red wale signs were present. Stigmata of prior treatment were evident.       Scarring from prior treatment was visible. Two bands were successfully       placed with complete eradication, resulting in deflation of varices.       There was self-limited oozing immediately after band ligation but it       stopped without any intervention. There was no bleeding at the end of       the maneuver.      LA Grade B (one or more mucosal breaks greater than 5 mm, not extending  between the tops of two mucosal folds) esophagitis was found in the       distal esophagus.      Scattered mild inflammation characterized by congestion (edema),       erosions and erythema was found in the entire examined stomach.      The cardia and gastric fundus were normal on retroflexion.      The duodenal bulb, first portion of the duodenum and second portion of       the duodenum were normal. Impression:               - Large (> 5 mm) esophageal varices with no                            bleeding and no stigmata of recent bleeding.                            Completely eradicated.  Banded.                           - LA Grade B esophagitis.                           - Gastritis.                           - Normal duodenal bulb, first portion of the                            duodenum and second portion of the duodenum.                           - No specimens collected. Moderate Sedation:      Moderate (conscious) sedation was personally administered by an       anesthesia professional. The following parameters were monitored: oxygen       saturation, heart rate, blood pressure, and response to care. Recommendation:           - Patient has a contact number available for                            emergencies. The signs and symptoms of potential                            delayed complications were discussed with the                            patient. Return to normal activities tomorrow.                            Written discharge instructions were provided to the                            patient.                           - Soft diet today.                           -  Continue present medications. Consider starting                            low-dose carvedilol. Nadolol  was discontinued                            because of borderline low blood pressure /                            bradycardia.                           - Repeat upper endoscopy in 6 months for                            retreatment. Procedure Code(s):        --- Professional ---                           [phone], Esophagogastroduodenoscopy, flexible,                            transoral; with band ligation of esophageal/gastric                            varices Diagnosis Code(s):        --- Professional ---                           I85.00, Esophageal varices without bleeding                           K20.90, Esophagitis, unspecified without bleeding                           K29.70, Gastritis, unspecified, without bleeding CPT copyright 2022 American Medical Association. All rights  reserved. The codes documented in this report are preliminary and upon coder review may  be revised to meet current compliance requirements. Layla Lah, MD Layla Lah, MD 11/09/2023 10:54:59 AM Number of Addenda: 0

## 2023-11-09 NOTE — Anesthesia Preprocedure Evaluation (Addendum)
 Anesthesia Evaluation  Patient identified by MRN, date of birth, ID band Patient awake    Reviewed: Allergy & Precautions, NPO status , Patient's Chart, lab work & pertinent test results, reviewed documented beta blocker date and time   History of Anesthesia Complications Negative for: history of anesthetic complications  Airway Mallampati: II  TM Distance: >3 FB Neck ROM: Full    Dental  (+) Dental Advisory Given   Pulmonary former smoker   breath sounds clear to auscultation       Cardiovascular hypertension, Pt. on medications and Pt. on home beta blockers (-) angina  Rhythm:Regular Rate:Normal     Neuro/Psych negative neurological ROS     GI/Hepatic ,GERD  Medicated and Controlled,,(+) Cirrhosis   Esophageal Varices      Endo/Other  diabetes (glu 90), Oral Hypoglycemic Agents  Mounjaro: last 2 weeks ago BMI 34  Renal/GU      Musculoskeletal   Abdominal   Peds  Hematology   Anesthesia Other Findings   Reproductive/Obstetrics                              Anesthesia Physical Anesthesia Plan  ASA: 3  Anesthesia Plan: MAC   Post-op Pain Management: Minimal or no pain anticipated   Induction:   PONV Risk Score and Plan: 1 and Treatment may vary due to age or medical condition  Airway Management Planned: Natural Airway and Simple Face Mask  Additional Equipment: None  Intra-op Plan:   Post-operative Plan:   Informed Consent: I have reviewed the patients History and Physical, chart, labs and discussed the procedure including the risks, benefits and alternatives for the proposed anesthesia with the patient or authorized representative who has indicated his/her understanding and acceptance.     Dental advisory given  Plan Discussed with: CRNA and Surgeon  Anesthesia Plan Comments:          Anesthesia Quick Evaluation

## 2023-11-09 NOTE — Transfer of Care (Signed)
 Immediate Anesthesia Transfer of Care Note  Patient: Hunter Gray.  Procedure(s) Performed: EGD (ESOPHAGOGASTRODUODENOSCOPY)  Patient Location: Endoscopy Unit  Anesthesia Type:MAC  Level of Consciousness: awake, alert , oriented, and patient cooperative  Airway & Oxygen Therapy: Patient Spontanous Breathing  Post-op Assessment: Report given to RN and Post -op Vital signs reviewed and stable  Post vital signs: Reviewed and stable  Last Vitals:  Vitals Value Taken Time  BP 86/44 11/09/23 10:53  Temp    Pulse 74 11/09/23 10:55  Resp 17 11/09/23 10:55  SpO2 93 % 11/09/23 10:55  Vitals shown include unfiled device data.  Last Pain:  Vitals:   11/09/23 1052  TempSrc: Temporal  PainSc: 0-No pain         Complications: There were no known notable events for this encounter.

## 2023-11-09 NOTE — H&P (Signed)
 Primary Care Physician:  Allen Lauraine LITTIE DEVONNA Primary Gastroenterologist:  Dr.Moshe Wenger  Reason for Visit : EGD for variceal band ligation  HPI: Hunter Gray. is a 53 y.o. male with past medical history of cirrhosis complicated by esophageal varices presented to [facility] for outpatient EGD.  EGD in Dec 2022 showed large esophageal varices and 4 bands were placed repeat was recommended in 3 months. Last EGD in June 2024 showed small esophageal varices.  No banding required.  Repeat was recommended in 1 year for surveillance.  Patient denies any acute issues since last visit.  Denies any abdominal pain, nausea or vomiting.  Denies blood in the stool or black stool.  Denies confusion.  Overall doing well from GI standpoint.  Currently on nadolol .   Past Medical History:  Diagnosis Date   Cirrhosis (HCC)    Cirrhosis of liver (HCC)    Diabetes mellitus without complication (HCC)    Esophageal varices (HCC)    Vision abnormalities     Past Surgical History:  Procedure Laterality Date   COLONOSCOPY WITH PROPOFOL  N/A 06/23/2022   Procedure: COLONOSCOPY WITH PROPOFOL ;  Surgeon: Elicia Claw, MD;  Location: [facility];  Service: Gastroenterology;  Laterality: N/A;   ESOPHAGEAL BANDING  05/14/2019   Procedure: ESOPHAGEAL BANDING;  Surgeon: Saintclair Jasper, MD;  Location: [facility];  Service: Gastroenterology;;   ESOPHAGEAL BANDING  08/23/2019   Procedure: ESOPHAGEAL BANDING;  Surgeon: Saintclair Jasper, MD;  Location: [facility];  Service: Gastroenterology;;   ESOPHAGEAL BANDING N/A 11/16/2019   Procedure: ESOPHAGEAL BANDING;  Surgeon: Elicia Claw, MD;  Location: [facility];  Service: Gastroenterology;  Laterality: N/A;   ESOPHAGEAL BANDING  01/25/2020   Procedure: ESOPHAGEAL BANDING;  Surgeon: Elicia Claw, MD;  Location: [facility];  Service: Gastroenterology;;   ESOPHAGEAL BANDING N/A 09/17/2020   Procedure: ESOPHAGEAL BANDING;  Surgeon: Elicia Claw, MD;  Location: [facility];  Service: Gastroenterology;  Laterality: N/A;   ESOPHAGEAL BANDING N/A 12/24/2020   Procedure: ESOPHAGEAL BANDING;  Surgeon: Elicia Claw, MD;  Location: [facility];  Service: Gastroenterology;  Laterality: N/A;   ESOPHAGOGASTRODUODENOSCOPY (EGD) WITH PROPOFOL  N/A 05/14/2019   Procedure: ESOPHAGOGASTRODUODENOSCOPY (EGD) WITH PROPOFOL ;  Surgeon: Saintclair Jasper, MD;  Location: [facility];  Service: Gastroenterology;  Laterality: N/A;   ESOPHAGOGASTRODUODENOSCOPY (EGD) WITH PROPOFOL  N/A 08/23/2019   Procedure: ESOPHAGOGASTRODUODENOSCOPY (EGD) WITH PROPOFOL ;  Surgeon: Saintclair Jasper, MD;  Location: [facility];  Service: Gastroenterology;  Laterality: N/A;   ESOPHAGOGASTRODUODENOSCOPY (EGD) WITH PROPOFOL  N/A 09/15/2019   Procedure: ESOPHAGOGASTRODUODENOSCOPY (EGD) WITH PROPOFOL ;  Surgeon: Elicia Claw, MD;  Location: [facility];  Service: Gastroenterology;  Laterality: N/A;   ESOPHAGOGASTRODUODENOSCOPY (EGD) WITH PROPOFOL  N/A 11/16/2019   Procedure: ESOPHAGOGASTRODUODENOSCOPY (EGD) WITH PROPOFOL ;  Surgeon: Elicia Claw, MD;  Location: [facility];  Service: Gastroenterology;  Laterality: N/A;   ESOPHAGOGASTRODUODENOSCOPY (EGD) WITH PROPOFOL  N/A 01/25/2020   Procedure: ESOPHAGOGASTRODUODENOSCOPY (EGD) WITH PROPOFOL ;  Surgeon: Elicia Claw, MD;  Location: [facility];  Service: Gastroenterology;  Laterality: N/A;   ESOPHAGOGASTRODUODENOSCOPY (EGD) WITH PROPOFOL  N/A 09/17/2020   Procedure: ESOPHAGOGASTRODUODENOSCOPY (EGD) WITH PROPOFOL ;  Surgeon: Elicia Claw, MD;  Location: [facility];  Service: Gastroenterology;  Laterality: N/A;   ESOPHAGOGASTRODUODENOSCOPY (EGD) WITH PROPOFOL  N/A 12/24/2020   Procedure: ESOPHAGOGASTRODUODENOSCOPY (EGD) WITH PROPOFOL ;  Surgeon: Elicia Claw, MD;  Location: [facility];  Service: Gastroenterology;  Laterality: N/A;   ESOPHAGOGASTRODUODENOSCOPY (EGD) WITH PROPOFOL  N/A 06/23/2022   Procedure:  ESOPHAGOGASTRODUODENOSCOPY (EGD) WITH PROPOFOL  with Pos. Banding;  Surgeon: Elicia Claw, MD;  Location: [facility];  Service: Gastroenterology;  Laterality: N/A;   GASTRIC VARICES BANDING N/A 09/15/2019   Procedure: GASTRIC VARICES BANDING;  Surgeon: Elicia Claw, MD;  Location: [facility];  Service: Gastroenterology;  Laterality: N/A;   POLYPECTOMY  06/23/2022   Procedure: POLYPECTOMY;  Surgeon: Elicia Claw, MD;  Location: [facility];  Service: Gastroenterology;;   SHOULDER ARTHROSCOPY Left age 80    Prior to Admission medications   Medication Sig Start Date End Date Taking? Authorizing Provider  fluticasone  (FLONASE ) 50 MCG/ACT nasal spray Place 1 spray into both nostrils 2 (two) times daily as needed for allergies or rhinitis (nasal congestion).    Yes [provider]  gabapentin  (NEURONTIN ) 600 MG tablet Take 600-1,200 mg by mouth See admin instructions. Take 1 tablet (600 mg) by mouth in the morning, take 1 tablet (600 mg) by mouth at lunch, & take 2 tablets (1200 mg) by mouth at bedtime. 03/30/19  Yes [provider]  nadolol  (CORGARD ) 20 MG tablet Take 0.5 tablets (10 mg total) by mouth daily. Patient taking differently: Take 20 mg by mouth daily.  05/19/19 09/07/20 Yes Kyle, Tyrone A, DO  SYNJARDY  XR 05-998 MG TB24 Take 2 tablets by mouth daily with breakfast.  04/28/19  Yes [provider]  VICTOZA  18 MG/3ML SOPN Inject 1.8 mg into the skin daily.  11/18/17  Yes [provider]  pantoprazole  (PROTONIX ) 40 MG tablet Take 1 tablet (40 mg total) by mouth 2 (two) times daily. Patient not taking: Reported on 11/15/2019 05/19/19 09/07/20  Kyle, Tyrone A, DO    Scheduled Meds: Continuous Infusions:  sodium chloride      sodium chloride  500 mL (11/09/23 0944)   PRN Meds:.  Allergies as of 10/29/2023 - Review Complete 10/19/2023  Allergen Reaction Noted   Glimepiride Other (See Comments) 05/24/2019   Duloxetine  hcl Nausea Only 07/31/2021     Family History  Problem Relation Age of Onset   Diabetes type II Mother    Heart disease Mother    Diabetes type II Father    Diabetes type II Sister     Social History   Socioeconomic History   Marital status: Married    Spouse name: Not on file   Number of children: Not on file   Years of education: Not on file   Highest education level: Not on file  Occupational History   Not on file  Tobacco Use   Smoking status: Former   Smokeless tobacco: Never  Vaping Use   Vaping status: Never Used  Substance and Sexual Activity   Alcohol use: Not Currently   Drug use: Not Currently   Sexual activity: Not on file  Other Topics Concern   Not on file  Social History Narrative   ** Merged History Encounter **       Social Drivers of Health   Financial Resource Strain: Low Risk  (11/12/2022)   Received from [facility]   Overall Financial Resource Strain (CARDIA)    Difficulty of Paying Living Expenses: Not hard at all  Food Insecurity: No Food Insecurity (11/12/2022)   Received from [facility]   Hunger Vital Sign    Within the past 12 months, you worried that your food would run out before you got the money to buy more.: Never true    Within the past 12 months, the food you bought just didn't last and you didn't have money to get more.: Never true  Transportation Needs: No Transportation Needs (11/12/2022)   Received from [facility]   PRAPARE -  Administrator, Civil Service (Medical): No    Lack of Transportation (Non-Medical): No  Physical Activity: Not on file  Stress: Not on file  Social Connections: Unknown (05/09/2021)   Received from [facility]   Social Network    Social Network: Not on file  Intimate Partner Violence: Unknown (04/07/2021)   Received from [facility]   HITS    Physically Hurt: Not on file    Insult or Talk Down To: Not on file    Threaten Physical Harm: Not on file    Scream or Curse: Not on file    Physical  Exam: Vital signs: Vitals:   11/09/23 0936  BP: 113/66  Pulse: 61  Resp: (!) 21  Temp: 98.6 F (37 C)  SpO2: 98%     General:   Alert,  Well-developed, well-nourished, pleasant and cooperative in NAD Lungs:  Clear throughout to auscultation.   No wheezes, crackles, or rhonchi. No acute distress. Heart:  Regular rate and rhythm; no murmurs, clicks, rubs,  or gallops. Abdomen: Soft, nontender, nondistended, bowel sounds present.  No peritoneal signs Rectal:  Deferred  GI:  Lab Results: No results for input(s): WBC, HGB, HCT, PLT in the last 72 hours. BMET No results for input(s): NA, K, CL, CO2, GLUCOSE, BUN, CREATININE, CALCIUM in the last 72 hours. LFT No results for input(s): PROT, ALBUMIN, AST, ALT, ALKPHOS, BILITOT, BILIDIR, IBILI in the last 72 hours. PT/INR No results for input(s): LABPROT, INR in the last 72 hours.   Studies/Results: No results found.  Impression/Plan: -Cirrhosis of the liver complicated by esophageal varices requiring band ligation .  Not on nadolol  because of bradycardia, borderline low blood pressure   Recommendations ------------------------- -Proceed with EGD with possible band ligation   Risks (bleeding, infection, bowel perforation that could require surgery, sedation-related changes in cardiopulmonary systems), benefits (identification and possible treatment of source of symptoms, exclusion of certain causes of symptoms), and alternatives (watchful waiting, radiographic imaging studies, empiric medical treatment)  were explained to patientin detail and patient wishes to proceed.    LOS: 0 days   Layla Lah  MD, FACP 11/09/2023, 10:01 AM  Contact #  [phone]

## 2023-11-10 ENCOUNTER — Encounter (HOSPITAL_COMMUNITY): Payer: Self-pay | Admitting: Gastroenterology

## 2023-11-11 ENCOUNTER — Inpatient Hospital Stay (HOSPITAL_BASED_OUTPATIENT_CLINIC_OR_DEPARTMENT_OTHER)
Admission: EM | Admit: 2023-11-11 | Discharge: 2023-11-16 | DRG: 872 | Disposition: A | Discharge status: Home / Self Care | Attending: Internal Medicine | Admitting: Internal Medicine

## 2023-11-11 ENCOUNTER — Emergency Department (HOSPITAL_BASED_OUTPATIENT_CLINIC_OR_DEPARTMENT_OTHER): Admitting: Radiology

## 2023-11-11 ENCOUNTER — Emergency Department (HOSPITAL_BASED_OUTPATIENT_CLINIC_OR_DEPARTMENT_OTHER)

## 2023-11-11 ENCOUNTER — Other Ambulatory Visit: Payer: Self-pay

## 2023-11-11 DIAGNOSIS — Z8249 Family history of ischemic heart disease and other diseases of the circulatory system: Secondary | ICD-10-CM

## 2023-11-11 DIAGNOSIS — E119 Type 2 diabetes mellitus without complications: Secondary | ICD-10-CM | POA: Diagnosis present

## 2023-11-11 DIAGNOSIS — K766 Portal hypertension: Secondary | ICD-10-CM | POA: Diagnosis present

## 2023-11-11 DIAGNOSIS — Z1152 Encounter for screening for COVID-19: Secondary | ICD-10-CM

## 2023-11-11 DIAGNOSIS — K7030 Alcoholic cirrhosis of liver without ascites: Secondary | ICD-10-CM | POA: Diagnosis present

## 2023-11-11 DIAGNOSIS — E876 Hypokalemia: Secondary | ICD-10-CM | POA: Diagnosis present

## 2023-11-11 DIAGNOSIS — E872 Acidosis, unspecified: Secondary | ICD-10-CM | POA: Diagnosis present

## 2023-11-11 DIAGNOSIS — I10 Essential (primary) hypertension: Secondary | ICD-10-CM | POA: Diagnosis present

## 2023-11-11 DIAGNOSIS — D696 Thrombocytopenia, unspecified: Secondary | ICD-10-CM | POA: Diagnosis present

## 2023-11-11 DIAGNOSIS — Z7985 Long-term (current) use of injectable non-insulin antidiabetic drugs: Secondary | ICD-10-CM

## 2023-11-11 DIAGNOSIS — Z87891 Personal history of nicotine dependence: Secondary | ICD-10-CM

## 2023-11-11 DIAGNOSIS — K029 Dental caries, unspecified: Secondary | ICD-10-CM | POA: Diagnosis present

## 2023-11-11 DIAGNOSIS — K0889 Other specified disorders of teeth and supporting structures: Secondary | ICD-10-CM

## 2023-11-11 DIAGNOSIS — Z888 Allergy status to other drugs, medicaments and biological substances status: Secondary | ICD-10-CM

## 2023-11-11 DIAGNOSIS — I8500 Esophageal varices without bleeding: Secondary | ICD-10-CM | POA: Diagnosis present

## 2023-11-11 DIAGNOSIS — Z881 Allergy status to other antibiotic agents status: Secondary | ICD-10-CM

## 2023-11-11 DIAGNOSIS — Z833 Family history of diabetes mellitus: Secondary | ICD-10-CM

## 2023-11-11 DIAGNOSIS — Z6832 Body mass index (BMI) 32.0-32.9, adult: Secondary | ICD-10-CM

## 2023-11-11 DIAGNOSIS — A419 Sepsis, unspecified organism: Principal | ICD-10-CM | POA: Diagnosis present

## 2023-11-11 DIAGNOSIS — Z79899 Other long term (current) drug therapy: Secondary | ICD-10-CM

## 2023-11-11 DIAGNOSIS — R6520 Severe sepsis without septic shock: Secondary | ICD-10-CM | POA: Diagnosis present

## 2023-11-11 DIAGNOSIS — Z7984 Long term (current) use of oral hypoglycemic drugs: Secondary | ICD-10-CM

## 2023-11-11 DIAGNOSIS — Z713 Dietary counseling and surveillance: Secondary | ICD-10-CM

## 2023-11-11 DIAGNOSIS — E66811 Obesity, class 1: Secondary | ICD-10-CM | POA: Diagnosis present

## 2023-11-11 LAB — URINALYSIS, W/ REFLEX TO CULTURE (INFECTION SUSPECTED)
Bacteria, UA: NONE SEEN
Bilirubin Urine: NEGATIVE
Glucose, UA: >1000 mg/dL — AB
Hgb urine dipstick: NEGATIVE
Ketones, ur: NEGATIVE mg/dL
Leukocytes,Ua: NEGATIVE
Nitrite: NEGATIVE
Protein, ur: NEGATIVE mg/dL
Specific Gravity, Urine: 1.01 (ref 1.005–1.030)
pH: 6 (ref 5.0–8.0)

## 2023-11-11 LAB — COMPREHENSIVE METABOLIC PANEL WITH GFR
ALT: 47 U/L — ABNORMAL HIGH (ref 0–44)
AST: 44 U/L — ABNORMAL HIGH (ref 15–41)
Albumin: 4.2 g/dL (ref 3.5–5.0)
Alkaline Phosphatase: 82 U/L (ref 38–126)
Anion gap: 13 (ref 5–15)
BUN: 9 mg/dL (ref 6–20)
CO2: 23 mmol/L (ref 22–32)
Calcium: 9.5 mg/dL (ref 8.9–10.3)
Chloride: 100 mmol/L (ref 98–111)
Creatinine, Ser: 0.95 mg/dL (ref 0.61–1.24)
GFR, Estimated: >60 mL/min (ref >60)
Glucose, Bld: 142 mg/dL — ABNORMAL HIGH (ref 70–99)
Potassium: 3.7 mmol/L (ref 3.5–5.1)
Sodium: 136 mmol/L (ref 135–145)
Total Bilirubin: 1 mg/dL (ref 0.0–1.2)
Total Protein: 7.4 g/dL (ref 6.5–8.1)

## 2023-11-11 LAB — LACTIC ACID, PLASMA
Lactic Acid, Venous: 2.4 mmol/L (ref 0.5–1.9)
Lactic Acid, Venous: 2.4 mmol/L (ref 0.5–1.9)

## 2023-11-11 LAB — CBC WITH DIFFERENTIAL/PLATELET
Abs Immature Granulocytes: 0.02 K/uL (ref 0.00–0.07)
Basophils Absolute: 0 K/uL (ref 0.0–0.1)
Basophils Relative: 0 %
Eosinophils Absolute: 0 K/uL (ref 0.0–0.5)
Eosinophils Relative: 0 %
HCT: 40.7 % (ref 39.0–52.0)
Hemoglobin: 14.6 g/dL (ref 13.0–17.0)
Immature Granulocytes: 0 %
Lymphocytes Relative: 6 %
Lymphs Abs: 0.3 K/uL — ABNORMAL LOW (ref 0.7–4.0)
MCH: 33.5 pg (ref 26.0–34.0)
MCHC: 35.9 g/dL (ref 30.0–36.0)
MCV: 93.3 fL (ref 80.0–100.0)
Monocytes Absolute: 0.3 K/uL (ref 0.1–1.0)
Monocytes Relative: 5 %
Neutro Abs: 5.1 K/uL (ref 1.7–7.7)
Neutrophils Relative %: 89 %
Platelets: 77 K/uL — ABNORMAL LOW (ref 150–400)
RBC: 4.36 MIL/uL (ref 4.22–5.81)
RDW: 13.4 % (ref 11.5–15.5)
WBC: 5.8 K/uL (ref 4.0–10.5)
nRBC: 0 % (ref 0.0–0.2)

## 2023-11-11 LAB — RESP PANEL BY RT-PCR (RSV, FLU A&B, COVID)  RVPGX2
Influenza A by PCR: NEGATIVE
Influenza B by PCR: NEGATIVE
Resp Syncytial Virus by PCR: NEGATIVE
SARS Coronavirus 2 by RT PCR: NEGATIVE

## 2023-11-11 LAB — PROTIME-INR
INR: 1.1 (ref 0.8–1.2)
Prothrombin Time: 15 s (ref 11.4–15.2)

## 2023-11-11 MED ORDER — KETOROLAC TROMETHAMINE 15 MG/ML IJ SOLN
15.0000 mg | Freq: Once | INTRAMUSCULAR | Status: AC
  Administered 2023-11-11: 15 mg via INTRAVENOUS
  Filled 2023-11-11: qty 1

## 2023-11-11 MED ORDER — LACTATED RINGERS IV BOLUS
1000.0000 mL | Freq: Once | INTRAVENOUS | Status: AC
  Administered 2023-11-11: 1000 mL via INTRAVENOUS

## 2023-11-11 MED ORDER — VANCOMYCIN HCL IN DEXTROSE 1-5 GM/200ML-% IV SOLN
1000.0000 mg | Freq: Once | INTRAVENOUS | Status: AC
  Administered 2023-11-11: 1000 mg via INTRAVENOUS
  Filled 2023-11-11: qty 200

## 2023-11-11 MED ORDER — VANCOMYCIN HCL IN DEXTROSE 1-5 GM/200ML-% IV SOLN: 1000.0000 mg | Freq: Once | INTRAVENOUS | Status: DC

## 2023-11-11 MED ORDER — ONDANSETRON HCL 4 MG/2ML IJ SOLN
4.0000 mg | Freq: Once | INTRAMUSCULAR | Status: AC
  Administered 2023-11-11: 4 mg via INTRAVENOUS
  Filled 2023-11-11: qty 2

## 2023-11-11 MED ORDER — VANCOMYCIN HCL 500 MG IV SOLR: 500.0000 mg | Freq: Once | INTRAVENOUS | Status: DC

## 2023-11-11 MED ORDER — LACTATED RINGERS IV SOLN
INTRAVENOUS | Status: AC
  Administered 2023-11-12: 150 mL/h via INTRAVENOUS

## 2023-11-11 MED ORDER — METRONIDAZOLE 500 MG/100ML IV SOLN
500.0000 mg | Freq: Once | INTRAVENOUS | Status: AC
  Administered 2023-11-11: 500 mg via INTRAVENOUS
  Filled 2023-11-11: qty 100

## 2023-11-11 MED ORDER — ACETAMINOPHEN 325 MG PO TABS: 975.0000 mg | ORAL_TABLET | Freq: Once | ORAL | Status: DC

## 2023-11-11 MED ORDER — VANCOMYCIN HCL 1750 MG/350ML IV SOLN
1750.0000 mg | Freq: Once | INTRAVENOUS | Status: DC
  Filled 2023-11-11: qty 350

## 2023-11-11 MED ORDER — IOHEXOL 300 MG/ML  SOLN
75.0000 mL | Freq: Once | INTRAMUSCULAR | Status: AC | PRN
  Administered 2023-11-11: 75 mL via INTRAVENOUS

## 2023-11-11 MED ORDER — SODIUM CHLORIDE 0.9 % IV SOLN
2.0000 g | Freq: Once | INTRAVENOUS | Status: AC
  Administered 2023-11-11: 2 g via INTRAVENOUS
  Filled 2023-11-11: qty 12.5

## 2023-11-11 NOTE — ED Triage Notes (Signed)
 Pt POV reporting fever/chills x1 day, began taking abx for dental abscess yesterday. Also reports EGD 11/4.

## 2023-11-11 NOTE — Sepsis Progress Note (Signed)
 Elink monitoring for the code sepsis protocol.

## 2023-11-11 NOTE — ED Notes (Addendum)
 Pt reports taking tylenol @1530 . Reports he is currently taking amoxicillin for dental abscess.

## 2023-11-11 NOTE — ED Provider Notes (Signed)
 Received patient in signout from his provider pending completion of ED workup, admission.  See his note.  In short, patient presents Emergency Department for evaluation of fever, left lower dental pain.  Seen by his dentist yesterday and prescribed tramadol , amoxicillin.  No complaints of cough, congestion, NVD, shortness of breath, urinary symptoms.  Of note, had endoscopy and esophageal banding 2 days ago for esophageal varices.  Physical exam performed by previous provider was negative for nuchal rigidity nor neck pain with no concern for meningitis.  No submandibular swelling or tenderness.  No drooling.  Low suspicion of Ludwig angina.  CT XR negative for pneumonia. UA wo infection  Lab work notable for elevated lactic acid of 2.4.  Was not provided additional fluids following additional reads so lactic acid remained at 2.4.  I did order additional LR for mild lactic acidosis.  Respiratory panel negative.  No leukocytosis.  Does meet sepsis protocol as he is tachycardic, fever and has a known source of infection.  Vanco, cefepime, Flagyl ordered for sepsis of unknown source  CT soft tissue ordered to ensure no dental abscess, HEENT emergent pathology of fever and CT chest ordered to ensure no infection following esophageal banding.  CT chest did note incidental mild gallbladder thickening.  RUQ ultrasound negative for acute cholecystitis. No abdominal tenderness. Does not warrant CT abdomen   Will admit to medicine for sepsis, fever of unknown etiology, concern for bacteremia.  Consulted hospitalist Dr. Lawence who accepts patient for admission     Minnie Tinnie BRAVO, [place] 11/12/23 0023    Cottie Donnice PARAS, MD 11/12/23 2110

## 2023-11-11 NOTE — ED Provider Notes (Signed)
 [place] EMERGENCY DEPARTMENT AT [facility] Provider Note   CSN: [id number] Arrival date & time: 11/11/23  1730     Patient presents with: Fever   Hunter Gray. is a 53 y.o. male.   The history is provided by the patient, the spouse and medical records. No language interpreter was used.  Fever    53 year old male with history of cirrhosis secondary to alcohol use, diabetes, esophageal varices Presenting with complaint of fever.  Patient states for the past several days he has had dental pain affecting his left lower tooth.  He also had an endoscopy 2 days ago for evaluation of esophageal varices and had esophageal banding.  The procedure went well.  Yesterday he was seen by a dentist for his dental pain and was prescribed tramadol , amoxicillin as treatment.  Today he endorsed having fever and chills and some headache.  He does not endorse any runny nose sneezing or coughing no sore throat no chest pain no trouble breathing no abdominal pain no urinary symptoms.  He did take some Tylenol prior to arrival.    Prior to Admission medications   Medication Sig Start Date End Date Taking? Authorizing Provider  acetaminophen (TYLENOL) 500 MG tablet Take 500-1,000 mg by mouth every 6 (six) hours as needed (pain.).    [provider]  carvedilol (COREG) 3.125 MG tablet Take 1 tablet (3.125 mg total) by mouth 2 (two) times daily. 11/09/23 11/08/24  Elicia Claw, MD  Empagliflozin -metFORMIN  HCl ER 25-1000 MG TB24 Take 1 tablet by mouth daily with breakfast. 04/28/19   [provider]  fluticasone  (FLONASE ) 50 MCG/ACT nasal spray Place 1 spray into both nostrils 2 (two) times daily as needed for allergies or rhinitis (nasal congestion).     [provider]  gabapentin  (NEURONTIN ) 600 MG tablet Take 1,800 mg by mouth in the morning and at bedtime. 03/30/19   [provider]  losartan (COZAAR) 25 MG tablet Take 25 mg by mouth every evening.     [provider]  pantoprazole  (PROTONIX ) 40 MG tablet Take 1 tablet (40 mg total) by mouth daily. 11/09/23   Brahmbhatt, Parag, MD  tirzepatide [facility]) 5 MG/0.5ML Pen Inject 5 mg into the skin once a week.    [provider]  VICTOZA  18 MG/3ML SOPN Inject 1.8 mg into the skin in the morning. Patient not taking: Reported on 10/25/2023 11/18/17   [provider]  Vitamin D-Vitamin K (D3 + K2 DOTS PO) Take 1 tablet by mouth in the morning.    [provider]    Allergies: Glimepiride and Duloxetine  hcl    Review of Systems  Constitutional:  Positive for fever.  All other systems reviewed and are negative.   Updated Vital Signs BP 112/64 (BP Location: Right Arm)   Pulse (!) 111   Temp (!) 103.2 F (39.6 C) (Oral)   Resp 16   Ht 5' 2 (1.575 m)   Wt 79.4 kg   SpO2 94%   BMI 32.01 kg/m   Physical Exam Constitutional:      General: He is not in acute distress.    Appearance: He is well-developed.     Comments: Patient laying in bed eyes closed but appears to be in no acute discomfort.  HENT:     Head: Atraumatic.     Mouth/Throat:     Comments: On my exam patient does have some dental decay.  Some mild tenderness noted to left lower jaw and tooth #19  but no obvious abscess or facial involvement.  No stridor or trismus.  Throat exam unremarkable. Eyes:     Conjunctiva/sclera: Conjunctivae normal.  Neck:     Comments: No nuchal rigidity, neck is supple, trachea midline, normal phonation Cardiovascular:     Rate and Rhythm: Tachycardia present.     Pulses: Normal pulses.     Heart sounds: Normal heart sounds.  Pulmonary:     Breath sounds: No wheezing, rhonchi or rales.  Abdominal:     Palpations: Abdomen is soft.     Tenderness: There is no abdominal tenderness.  Musculoskeletal:     Cervical back: Normal range of motion and neck supple.     Comments: 5 out of 5 strength in all 4 extremities  Skin:    Findings: No rash.  Neurological:      Mental Status: He is alert and oriented to person, place, and time.     (all labs ordered are listed, but only abnormal results are displayed) Labs Reviewed  COMPREHENSIVE METABOLIC PANEL WITH GFR - Abnormal; Notable for the following components:      Result Value   Glucose, Bld 142 (*)    AST 44 (*)    ALT 47 (*)    All other components within normal limits  LACTIC ACID, PLASMA - Abnormal; Notable for the following components:   Lactic Acid, Venous 2.4 (*)    All other components within normal limits  CBC WITH DIFFERENTIAL/PLATELET - Abnormal; Notable for the following components:   Platelets 77 (*)    Lymphs Abs 0.3 (*)    All other components within normal limits  CULTURE, BLOOD (ROUTINE X 2)  CULTURE, BLOOD (ROUTINE X 2)  RESP PANEL BY RT-PCR (RSV, FLU A&B, COVID)  RVPGX2  PROTIME-INR  LACTIC ACID, PLASMA  URINALYSIS, W/ REFLEX TO CULTURE (INFECTION SUSPECTED)    EKG: None  Radiology: DG Chest 2 View Result Date: 11/11/2023 EXAM: 2 VIEW(S) XRAY OF THE CHEST 11/11/2023 06:26:00 PM COMPARISON: 3 / 27 / 20 CLINICAL HISTORY: Fever. FINDINGS: LUNGS AND PLEURA: No focal pulmonary opacity. No pulmonary edema. No pleural effusion. No pneumothorax. HEART AND MEDIASTINUM: No acute abnormality of the cardiac and mediastinal silhouettes. BONES AND SOFT TISSUES: No acute osseous abnormality. IMPRESSION: 1. No acute process. Electronically signed by: Norman Gatlin MD 11/11/2023 06:31 PM EST RP Workstation: [id number]     .Critical Care  Performed by: Nivia Colon, PA-C Authorized by: Nivia Colon, PA-C   Critical care provider statement:    Critical care time (minutes):  30   Critical care was time spent personally by me on the following activities:  Development of treatment plan with patient or surrogate, discussions with consultants, evaluation of patient's response to treatment, examination of patient, ordering and review of laboratory studies, ordering and review of  radiographic studies, ordering and performing treatments and interventions, pulse oximetry, re-evaluation of patient's condition and review of old charts    Medications Ordered in the ED  lactated ringers  infusion (has no administration in time range)  ceFEPIme (MAXIPIME) 2 g in sodium chloride  0.9 % 100 mL IVPB (2 g Intravenous New Bag/Given 11/11/23 1839)  metroNIDAZOLE (FLAGYL) IVPB 500 mg (has no administration in time range)  vancomycin (VANCOREADY) IVPB 1750 mg/350 mL (has no administration in time range)  lactated ringers  bolus 1,000 mL (1,000 mLs Intravenous New Bag/Given 11/11/23 1841)  Medical Decision Making Amount and/or Complexity of Data Reviewed Labs: ordered. Radiology: ordered.  Risk Prescription drug management.   BP 112/64 (BP Location: Right Arm)   Pulse (!) 111   Temp (!) 103.2 F (39.6 C) (Oral)   Resp 16   Ht 5' 2 (1.575 m)   Wt 79.4 kg   SpO2 94%   BMI 32.01 kg/m   35:37 PM  53 year old male with history of cirrhosis secondary to alcohol use, diabetes, esophageal varices Presenting with complaint of fever.  Patient states for the past several days he has had dental pain affecting his left lower tooth.  He also had an endoscopy 2 days ago for evaluation of esophageal varices and had esophageal banding.  The procedure went well.  Yesterday he was seen by a dentist for his dental pain and was prescribed tramadol , amoxicillin as treatment.  Today he endorsed having fever and chills and some headache.  He does not endorse any runny nose sneezing or coughing no sore throat no chest pain no trouble breathing no abdominal pain no urinary symptoms.  He did take some Tylenol prior to arrival.  On exam patient does have some mild tenderness to his left lower canine on palpation but no obvious dental abscess or facial involvement noted.  Lungs otherwise clear.  Abdomen soft nontender.  Throat exam unremarkable.  Strength equal  throughout, mentating appropriately.  Patient exhibits no nuchal rigidity.  Vitals are notable for elevated temperature of 103.2.  He is tachycardic with heart rate of 111.  Blood pressure is 112/64.  No hypoxia.  Code sepsis initiated, broad-spectrum antibiotic given.  Care discussed with attending Dr. Cottie  -Labs ordered, independently viewed and interpreted by me.  Labs remarkable for lactic acid of 2.4, will give fluid resuscitation at 30ml/kg.  WBC 5.8.  UA is pending -The patient was maintained on a cardiac monitor.  I personally viewed and interpreted the cardiac monitored which showed an underlying rhythm of: sinus tachycardia -Imaging independently viewed and interpreted by me and I agree with radiologist's interpretation.  Result remarkable for CXR unremarkable -This patient presents to the ED for concern of fever, this involves an extensive number of treatment options, and is a complaint that carries with it a high risk of complications and morbidity.  The differential diagnosis includes dental infection, viral illness, meningitis, pna, postop complication -Co morbidities that complicate the patient evaluation includes liver cirrhosis, DM -Treatment includes broad spectrum abx, IVF, tylenol -Reevaluation of the patient after these medicines showed that the patient improved -PCP office notes or outside notes reviewed -Discussion with oncoming provider who will f/u on UA result, reassess pt and determine disposition.  Anticipate hospital admission for sepsis.   -Escalation to admission/observation considered: dispo pending GI Dr. Elicia performed recent EGD and esophageal banding on 11/09/2023.       Final diagnoses:  Sepsis without acute organ dysfunction, due to unspecified organism [facility])    ED Discharge Orders     None          Nivia Colon, PA-C 11/11/23 1850    Cottie Donnice PARAS, MD 11/11/23 [phone]

## 2023-11-11 NOTE — ED Notes (Signed)
[place] finishing up in room .

## 2023-11-11 NOTE — ED Notes (Signed)
 Second set of blood cultures collected from [facility]

## 2023-11-11 NOTE — Sepsis Progress Note (Addendum)
 Notified bedside nurse of need to draw repeat lactic acid. ED RN via secure chat.  1937-acknowledged via secure chat.

## 2023-11-11 NOTE — ED Notes (Signed)
 Patient transported to CT

## 2023-11-11 NOTE — ED Notes (Signed)
 Pt in xray

## 2023-11-11 NOTE — ED Notes (Signed)
 Pt continues to c/o HA, fever has come down some.  Lactic pending and CT scan ordered.  Family at bedside

## 2023-11-12 ENCOUNTER — Encounter (HOSPITAL_COMMUNITY): Payer: Self-pay | Admitting: Family Medicine

## 2023-11-12 DIAGNOSIS — A419 Sepsis, unspecified organism: Secondary | ICD-10-CM

## 2023-11-12 DIAGNOSIS — Z792 Long term (current) use of antibiotics: Secondary | ICD-10-CM

## 2023-11-12 DIAGNOSIS — K7030 Alcoholic cirrhosis of liver without ascites: Secondary | ICD-10-CM | POA: Diagnosis not present

## 2023-11-12 DIAGNOSIS — R6520 Severe sepsis without septic shock: Secondary | ICD-10-CM | POA: Diagnosis not present

## 2023-11-12 LAB — CBG MONITORING, ED: Glucose-Capillary: 100 mg/dL — ABNORMAL HIGH (ref 70–99)

## 2023-11-12 LAB — GLUCOSE, CAPILLARY
Glucose-Capillary: 140 mg/dL — ABNORMAL HIGH (ref 70–99)
Glucose-Capillary: 90 mg/dL (ref 70–99)

## 2023-11-12 MED ORDER — GABAPENTIN 300 MG PO CAPS
900.0000 mg | ORAL_CAPSULE | Freq: Three times a day (TID) | ORAL | Status: DC
  Administered 2023-11-12 – 2023-11-16 (×12): 900 mg via ORAL
  Filled 2023-11-12 (×10): qty 3
  Filled 2023-11-12: qty 9
  Filled 2023-11-12: qty 3

## 2023-11-12 MED ORDER — LOSARTAN POTASSIUM 25 MG PO TABS
25.0000 mg | ORAL_TABLET | Freq: Every evening | ORAL | Status: DC
  Administered 2023-11-12: 25 mg via ORAL
  Filled 2023-11-12: qty 1

## 2023-11-12 MED ORDER — ACETAMINOPHEN 500 MG PO TABS
1000.0000 mg | ORAL_TABLET | Freq: Four times a day (QID) | ORAL | Status: DC | PRN
  Administered 2023-11-12 – 2023-11-13 (×3): 1000 mg via ORAL
  Filled 2023-11-12 (×3): qty 2

## 2023-11-12 MED ORDER — ACETAMINOPHEN 500 MG PO TABS
500.0000 mg | ORAL_TABLET | Freq: Once | ORAL | Status: AC
  Administered 2023-11-12: 500 mg via ORAL
  Filled 2023-11-12: qty 1

## 2023-11-12 MED ORDER — SODIUM CHLORIDE 0.9 % IV SOLN
3.0000 g | Freq: Four times a day (QID) | INTRAVENOUS | Status: DC
  Administered 2023-11-12 – 2023-11-14 (×9): 3 g via INTRAVENOUS
  Filled 2023-11-12 (×9): qty 8

## 2023-11-12 MED ORDER — SODIUM CHLORIDE 0.9 % IV SOLN
2.0000 g | Freq: Three times a day (TID) | INTRAVENOUS | Status: DC
  Administered 2023-11-12 (×2): 2 g via INTRAVENOUS
  Filled 2023-11-12 (×2): qty 12.5

## 2023-11-12 MED ORDER — METRONIDAZOLE 500 MG/100ML IV SOLN
500.0000 mg | Freq: Two times a day (BID) | INTRAVENOUS | Status: DC
  Administered 2023-11-12: 500 mg via INTRAVENOUS
  Filled 2023-11-12: qty 100

## 2023-11-12 MED ORDER — EMPAGLIFLOZIN 25 MG PO TABS: 25.0000 mg | ORAL_TABLET | Freq: Every day | ORAL | Status: DC

## 2023-11-12 MED ORDER — PANTOPRAZOLE SODIUM 40 MG PO TBEC
40.0000 mg | DELAYED_RELEASE_TABLET | Freq: Every day | ORAL | Status: DC
  Administered 2023-11-12 – 2023-11-16 (×5): 40 mg via ORAL
  Filled 2023-11-12 (×5): qty 1

## 2023-11-12 MED ORDER — SODIUM CHLORIDE 0.9 % IV SOLN: INTRAVENOUS | Status: DC

## 2023-11-12 MED ORDER — METFORMIN HCL ER 500 MG PO TB24: 1000.0000 mg | ORAL_TABLET | Freq: Every day | ORAL | Status: DC

## 2023-11-12 MED ORDER — GABAPENTIN 300 MG PO CAPS
1800.0000 mg | ORAL_CAPSULE | Freq: Two times a day (BID) | ORAL | Status: DC
  Administered 2023-11-12: 1800 mg via ORAL
  Filled 2023-11-12: qty 6

## 2023-11-12 MED ORDER — EMPAGLIFLOZIN-METFORMIN HCL ER 25-1000 MG PO TB24: 1.0000 | ORAL_TABLET | Freq: Every day | ORAL | Status: DC

## 2023-11-12 MED ORDER — VANCOMYCIN HCL 1250 MG/250ML IV SOLN
1250.0000 mg | INTRAVENOUS | Status: DC
  Administered 2023-11-12 – 2023-11-14 (×3): 1250 mg via INTRAVENOUS
  Filled 2023-11-12 (×4): qty 250

## 2023-11-12 MED ORDER — ONDANSETRON HCL 4 MG/2ML IJ SOLN
4.0000 mg | Freq: Once | INTRAMUSCULAR | Status: AC
  Administered 2023-11-12: 4 mg via INTRAVENOUS
  Filled 2023-11-12: qty 2

## 2023-11-12 MED ORDER — TRAMADOL HCL 50 MG PO TABS
50.0000 mg | ORAL_TABLET | Freq: Once | ORAL | Status: AC
Start: 2023-11-12 — End: 2023-11-12
  Administered 2023-11-12: 50 mg via ORAL
  Filled 2023-11-12: qty 1

## 2023-11-12 MED ORDER — TRAMADOL HCL 50 MG PO TABS
50.0000 mg | ORAL_TABLET | Freq: Three times a day (TID) | ORAL | Status: DC | PRN
  Administered 2023-11-12 – 2023-11-16 (×7): 50 mg via ORAL
  Filled 2023-11-12 (×7): qty 1

## 2023-11-12 MED ORDER — INSULIN ASPART 100 UNIT/ML IJ SOLN
0.0000 [IU] | Freq: Three times a day (TID) | INTRAMUSCULAR | Status: DC
  Administered 2023-11-12 – 2023-11-15 (×2): 2 [IU] via SUBCUTANEOUS
  Filled 2023-11-12 (×2): qty 2

## 2023-11-12 NOTE — H&P (Signed)
 History and Physical    Patient: Hunter Gray. FMW:[id number] DOB: 08/05/1970 DOA: 11/11/2023 DOS: the patient was seen and examined on 11/12/2023 PCP: Allen Lauraine CROME, PA-C  Patient coming from: Home  Chief Complaint:  Chief Complaint  Patient presents with   Fever   HPI: Hunter Gray. is a 53 y.o. male with medical history significant of cirrhosis of liver (s/p recent esophageal banding 2 days ago; follows w/ Dr. Alejandra), and DM2 p/w fever and hypotension c/f sepsis on unknown etiology.  The patient reported rigors this past week. On Thursday, while at home, the patient began experiencing cold chills and uncontrollable shaking despite wearing layers of clothing and using two electric blankets. The patient was the only one in the household experiencing these symptoms. Subsequently, the patient was brought to the hospital and has been there since 4 PM yesterday. The patient also mentioned having a left lower molar infection with planned tooth extraction pending as well as recent esophageal banding procedure with no previous issues.  In the [facility] ED, pt febrile, tachycardic ,and hypotensive. Labs notable for lactic acid 2.4 and WBC 5.8. CT chest and CT soft neck w/o acute findings. CXR w/o acute process. [facility] ED requested [facility] Tele and pt accepted by overnight doctor.   Review of Systems: As mentioned in the history of present illness. All other systems reviewed and are negative. Past Medical History:  Diagnosis Date   Cirrhosis (HCC)    Cirrhosis of liver (HCC)    Diabetes mellitus without complication (HCC)    Esophageal varices (HCC)    Vision abnormalities    Past Surgical History:  Procedure Laterality Date   COLONOSCOPY WITH PROPOFOL  N/A 06/23/2022   Procedure: COLONOSCOPY WITH PROPOFOL ;  Surgeon: Elicia Claw, MD;  Location: [facility];  Service: Gastroenterology;  Laterality: N/A;   ESOPHAGEAL BANDING  05/14/2019   Procedure: ESOPHAGEAL BANDING;  Surgeon:  Saintclair Jasper, MD;  Location: [facility];  Service: Gastroenterology;;   ESOPHAGEAL BANDING  08/23/2019   Procedure: ESOPHAGEAL BANDING;  Surgeon: Saintclair Jasper, MD;  Location: [facility];  Service: Gastroenterology;;   ESOPHAGEAL BANDING N/A 11/16/2019   Procedure: ESOPHAGEAL BANDING;  Surgeon: Elicia Claw, MD;  Location: [facility];  Service: Gastroenterology;  Laterality: N/A;   ESOPHAGEAL BANDING  01/25/2020   Procedure: ESOPHAGEAL BANDING;  Surgeon: Elicia Claw, MD;  Location: [facility];  Service: Gastroenterology;;   ESOPHAGEAL BANDING N/A 09/17/2020   Procedure: ESOPHAGEAL BANDING;  Surgeon: Elicia Claw, MD;  Location: [facility];  Service: Gastroenterology;  Laterality: N/A;   ESOPHAGEAL BANDING N/A 12/24/2020   Procedure: ESOPHAGEAL BANDING;  Surgeon: Elicia Claw, MD;  Location: [facility];  Service: Gastroenterology;  Laterality: N/A;   ESOPHAGOGASTRODUODENOSCOPY N/A 11/09/2023   Procedure: EGD (ESOPHAGOGASTRODUODENOSCOPY);  Surgeon: Elicia Claw, MD;  Location: [facility];  Service: Gastroenterology;  Laterality: N/A;   ESOPHAGOGASTRODUODENOSCOPY (EGD) WITH PROPOFOL  N/A 05/14/2019   Procedure: ESOPHAGOGASTRODUODENOSCOPY (EGD) WITH PROPOFOL ;  Surgeon: Saintclair Jasper, MD;  Location: [facility];  Service: Gastroenterology;  Laterality: N/A;   ESOPHAGOGASTRODUODENOSCOPY (EGD) WITH PROPOFOL  N/A 08/23/2019   Procedure: ESOPHAGOGASTRODUODENOSCOPY (EGD) WITH PROPOFOL ;  Surgeon: Saintclair Jasper, MD;  Location: [facility];  Service: Gastroenterology;  Laterality: N/A;   ESOPHAGOGASTRODUODENOSCOPY (EGD) WITH PROPOFOL  N/A 09/15/2019   Procedure: ESOPHAGOGASTRODUODENOSCOPY (EGD) WITH PROPOFOL ;  Surgeon: Elicia Claw, MD;  Location: [facility];  Service: Gastroenterology;  Laterality: N/A;   ESOPHAGOGASTRODUODENOSCOPY (EGD) WITH PROPOFOL  N/A 11/16/2019   Procedure: ESOPHAGOGASTRODUODENOSCOPY (EGD) WITH PROPOFOL ;  Surgeon: Elicia Claw, MD;  Location: [facility];  Service: Gastroenterology;  Laterality: N/A;   ESOPHAGOGASTRODUODENOSCOPY (EGD) WITH PROPOFOL  N/A 01/25/2020   Procedure: ESOPHAGOGASTRODUODENOSCOPY (EGD) WITH PROPOFOL ;  Surgeon: Elicia Claw, MD;  Location: [facility];  Service: Gastroenterology;  Laterality: N/A;   ESOPHAGOGASTRODUODENOSCOPY (EGD) WITH PROPOFOL  N/A 09/17/2020   Procedure: ESOPHAGOGASTRODUODENOSCOPY (EGD) WITH PROPOFOL ;  Surgeon: Elicia Claw, MD;  Location: [facility];  Service: Gastroenterology;  Laterality: N/A;   ESOPHAGOGASTRODUODENOSCOPY (EGD) WITH PROPOFOL  N/A 12/24/2020   Procedure: ESOPHAGOGASTRODUODENOSCOPY (EGD) WITH PROPOFOL ;  Surgeon: Elicia Claw, MD;  Location: [facility];  Service: Gastroenterology;  Laterality: N/A;   ESOPHAGOGASTRODUODENOSCOPY (EGD) WITH PROPOFOL  N/A 06/23/2022   Procedure: ESOPHAGOGASTRODUODENOSCOPY (EGD) WITH PROPOFOL  with Pos. Banding;  Surgeon: Elicia Claw, MD;  Location: [facility];  Service: Gastroenterology;  Laterality: N/A;   GASTRIC VARICES BANDING N/A 09/15/2019   Procedure: GASTRIC VARICES BANDING;  Surgeon: Elicia Claw, MD;  Location: [facility];  Service: Gastroenterology;  Laterality: N/A;   POLYPECTOMY  06/23/2022   Procedure: POLYPECTOMY;  Surgeon: Elicia Claw, MD;  Location: [facility];  Service: Gastroenterology;;   SHOULDER ARTHROSCOPY Left age 11   Social History:  reports that he has quit smoking. He has never used smokeless tobacco. He reports that he does not currently use alcohol. He reports that he does not currently use drugs.  Allergies  Allergen Reactions   Glimepiride Other (See Comments)    Gas and weight gain   Duloxetine  Hcl Nausea Only    Generic caused side effects    Family History  Problem Relation Age of Onset   Diabetes type II Mother    Heart disease Mother    Diabetes type II Father    Diabetes type II Sister     Prior to Admission medications   Medication Sig Start Date End Date Taking?  Authorizing Provider  acetaminophen (TYLENOL) 500 MG tablet Take 500-1,000 mg by mouth every 6 (six) hours as needed (pain.).   Yes [provider]  amoxicillin (AMOXIL) 875 MG tablet Take 875 mg by mouth 2 (two) times daily. 11/10/23  Yes [provider]  Empagliflozin -metFORMIN  HCl ER 25-1000 MG TB24 Take 1 tablet by mouth daily with breakfast. 04/28/19  Yes [provider]  fluticasone  (FLONASE ) 50 MCG/ACT nasal spray Place 1 spray into both nostrils 2 (two) times daily as needed for allergies or rhinitis (nasal congestion).    Yes [provider]  gabapentin  (NEURONTIN ) 600 MG tablet Take 1,800 mg by mouth in the morning and at bedtime. 03/30/19  Yes [provider]  losartan (COZAAR) 25 MG tablet Take 25 mg by mouth every evening.   Yes [provider]  MOUNJARO 10 MG/0.5ML Pen Inject 10 mg into the skin once a week. 10/10/23  Yes [provider]  pantoprazole  (PROTONIX ) 40 MG tablet Take 1 tablet (40 mg total) by mouth daily. 11/09/23  Yes Brahmbhatt, Parag, MD  traMADol  (ULTRAM ) 50 MG tablet Take 50 mg by mouth every 8 (eight) hours as needed.   Yes [provider]  Vitamin D-Vitamin K (D3 + K2 DOTS PO) Take 1 tablet by mouth in the morning.   Yes [provider]  carvedilol (COREG) 3.125 MG tablet Take 1 tablet (3.125 mg total) by mouth 2 (two) times daily. 11/09/23 11/08/24  Elicia Claw, MD    Physical Exam: Vitals:   11/12/23 1030 11/12/23 1032 11/12/23 1042 11/12/23 1210  BP: (!) 106/52   117/60  Pulse: 86   77  Resp: 16   19  Temp:  98.3 F (  36.8 C) 99.7 F (37.6 C) 99.3 F (37.4 C)  TempSrc:  Oral Oral Oral  SpO2: 95%   94%  Weight:      Height:       General: Alert, oriented x3, resting comfortably in no acute distress Respiratory: Lungs clear to auscultation bilaterally with normal respiratory effort; no w/r/r Cardiovascular: Regular rate and rhythm w/o m/r/g Abdomen: Soft, nontender,  nondistended. Positive bowel sounds   Data Reviewed:  Lab Results  Component Value Date   WBC 5.8 11/11/2023   HGB 14.6 11/11/2023   HCT 40.7 11/11/2023   MCV 93.3 11/11/2023   PLT 77 (L) 11/11/2023   Lab Results  Component Value Date   GLUCOSE 142 (H) 11/11/2023   CALCIUM 9.5 11/11/2023   NA 136 11/11/2023   K 3.7 11/11/2023   CO2 23 11/11/2023   CL 100 11/11/2023   BUN 9 11/11/2023   CREATININE 0.95 11/11/2023   Lab Results  Component Value Date   ALT 47 (H) 11/11/2023   AST 44 (H) 11/11/2023   ALKPHOS 82 11/11/2023   BILITOT 1.0 11/11/2023   Lab Results  Component Value Date   INR 1.1 11/11/2023   INR 1.3 (H) 05/15/2019   INR 1.3 (H) 05/14/2019   Radiology: US  Abdomen Limited RUQ (LIVER/GB) Result Date: 11/11/2023 CLINICAL DATA:  Abdominal pain EXAM: ULTRASOUND ABDOMEN LIMITED RIGHT UPPER QUADRANT COMPARISON:  Ultrasound 12/16/2020 FINDINGS: Gallbladder: No obvious shadowing stones. Slight increased wall thickness at 4.9 mm, negative sonographic Murphy. Limited visibility Common bile duct: Diameter: 4.6 mm Liver: Liver cirrhosis. Limited visualization. Portal vein is patent on color imaging with biphasic flow/pulsatile flow Other: None. IMPRESSION: 1. Limited study secondary to habitus and limited windows. Liver cirrhosis. 2. Slight increased gallbladder wall thickness without visible stones. Negative sonographic Murphy. Findings are nonspecific and could be secondary to liver disease, or edematous state. If further imaging is required, suggest correlation with nuclear medicine hepatobiliary imaging. Electronically Signed   By: Luke Bun M.D.   On: 11/11/2023 22:46   CT Soft Tissue Neck W Contrast Result Date: 11/11/2023 EXAM: CT NECK WITH CONTRAST 11/11/2023 08:54:37 PM TECHNIQUE: CT of the neck was performed with the administration of intravenous contrast. Multiplanar reformatted images are provided for review. Automated exposure control, iterative reconstruction,  and/or weight based adjustment of the mA/kV was utilized to reduce the radiation dose to as low as reasonably achievable. COMPARISON: None available. CLINICAL HISTORY: Soft tissue infection suspected, neck, xray done; recent esophageal banding now presenting with fever unclear origin - evaluation for esophageal perforation or abscess. FINDINGS: AERODIGESTIVE TRACT: No discrete mass. No edema. SALIVARY GLANDS: The parotid and submandibular glands are unremarkable. THYROID: Unremarkable. LYMPH NODES: No suspicious cervical lymphadenopathy. SOFT TISSUES: No mass or fluid collection. BRAIN, ORBITS, SINUSES AND MASTOIDS: Opacified right maxillary sinus. LUNGS AND MEDIASTINUM: No acute abnormality. BONES: No focal bone abnormality. IMPRESSION: 1. No evidence of abscess or acute abnormality in the neck. 2. Opacified right maxillary sinus. Electronically signed by: Gilmore Molt MD 11/11/2023 09:17 PM EST RP Workstation: [id number]   CT Chest W Contrast Result Date: 11/11/2023 CLINICAL DATA:  Sepsis, infection EXAM: CT CHEST WITH CONTRAST TECHNIQUE: Multidetector CT imaging of the chest was performed during intravenous contrast administration. RADIATION DOSE REDUCTION: This exam was performed according to the departmental dose-optimization program which includes automated exposure control, adjustment of the mA and/or kV according to patient size and/or use of iterative reconstruction technique. CONTRAST:  75mL OMNIPAQUE  IOHEXOL  300 MG/ML  SOLN COMPARISON:  11/11/2023 FINDINGS:  Cardiovascular: The heart is unremarkable without pericardial effusion. No evidence of thoracic aortic aneurysm or dissection. Atherosclerosis of the aorta and coronary vasculature. Mediastinum/Nodes: No enlarged mediastinal, hilar, or axillary lymph nodes. Thyroid gland, trachea, and esophagus demonstrate no significant findings. Small hiatal hernia. Lungs/Pleura: No acute airspace disease, effusion, or pneumothorax. Minimal right lower lobe  atelectasis. Central airways are patent. Upper Abdomen: Cirrhotic morphology of the liver. Mild gallbladder wall thickening measuring 4 mm. If there is concern for gallbladder pathology, right upper quadrant ultrasound may be useful. The spleen is incompletely imaged due to slice selection, but is likely enlarged. Sequela of portal venous hypertension with upper abdominal varices, most pronounced within the distal esophagus. Musculoskeletal: No acute or destructive bony abnormalities. Reconstructed images demonstrate no additional findings. IMPRESSION: 1. No acute intrathoracic process. 2. Cirrhosis, with portal venous hypertension manifested by splenomegaly and upper abdominal varices. 3. Mild gallbladder wall thickening, mild specific in this patient with cirrhosis. If gallbladder pathology is suspected, right upper quadrant ultrasound could be performed. 4. Small hiatal hernia. 5. Aortic Atherosclerosis (ICD10-I70.0). Coronary artery atherosclerosis. Electronically Signed   By: Ozell Daring M.D.   On: 11/11/2023 21:04   DG Chest 2 View Result Date: 11/11/2023 EXAM: 2 VIEW(S) XRAY OF THE CHEST 11/11/2023 06:26:00 PM COMPARISON: 3 / 27 / 20 CLINICAL HISTORY: Fever. FINDINGS: LUNGS AND PLEURA: No focal pulmonary opacity. No pulmonary edema. No pleural effusion. No pneumothorax. HEART AND MEDIASTINUM: No acute abnormality of the cardiac and mediastinal silhouettes. BONES AND SOFT TISSUES: No acute osseous abnormality. IMPRESSION: 1. No acute process. Electronically signed by: Norman Gatlin MD 11/11/2023 06:31 PM EST RP Workstation: [id number]    Assessment and Plan: 49M h/o cirrhosis of liver (s/p recent esophageal banding 2 days ago; follows w/ Dr. Alejandra), and DM2 p/w fever and hypotension c/f sepsis on unknown etiology.  Sepsis of unknown etiology Elevated lactic acid -ID consulted; apprec eval/recs -Continue IV vancomycin, cefepime and flagyl for now -MIVF: NS at 100cc/h for now -F/u blood  cultures  DM2 -SSI TID AC alone for now -HOLD pta metformin  and empaglifozin  HTN -HOLD pta Coreg -PTA losartan 25mg  daily for now   Advance Care Planning:   Code Status: Full Code   Consults: ID  Family Communication: Spouse  Severity of Illness: The appropriate patient status for this patient is INPATIENT. Inpatient status is judged to be reasonable and necessary in order to provide the required intensity of service to ensure the patient's safety. The patient's presenting symptoms, physical exam findings, and initial radiographic and laboratory data in the context of their chronic comorbidities is felt to place them at high risk for further clinical deterioration. Furthermore, it is not anticipated that the patient will be medically stable for discharge from the hospital within 2 midnights of admission.   * I certify that at the point of admission it is my clinical judgment that the patient will require inpatient hospital care spanning beyond 2 midnights from the point of admission due to high intensity of service, high risk for further deterioration and high frequency of surveillance required.*   ------- I spent 57 minutes reviewing previous notes, at the bedside counseling/discussing the treatment plan, and performing clinical documentation.  Author: Marsha Ada, MD 11/12/2023 2:42 PM  For on call review [email or web address].

## 2023-11-12 NOTE — Progress Notes (Signed)
 Patient admitted to the floor, Vitals signs taken and recorded. Oriented to the floor  and staff. [facility] made aware of the admission.

## 2023-11-12 NOTE — Consult Note (Addendum)
 [facility]    Date of Admission:  11/11/2023 Total days of antibiotics:1 vanco/cefepime/flagyl               Reason for Consult: Fever    Referring Provider: Georgina   Assessment: Fever, Sepsis Suspected dental space infection  Plan: Would change his anbx to unasyn and vanco Await BCx  Comment-  His scans are unrevealing. His recent banding is not an uncommon source of bacteremia (strep). His liver/spleen dysfunction predisposes him for this.  He could have dental space infection but sepsis from this is unusual.  Unasyn will cover these, will leave vanco on only as he is septic.  ID will f/u  Thank you so much for this interesting consult,  Principal Problem:   Sepsis due to undetermined organism (HCC)    gabapentin   900 mg Oral TID   insulin  aspart  0-15 Units Subcutaneous TID WC   losartan  25 mg Oral QPM   pantoprazole   40 mg Oral Daily    HPI: Hunter Gray. is a 53 y.o. male with hx of cirrhosis due to ETOH, varicies, DM, who underwent esophageal banding 11-4. He was then seen by his dentist on 11-5 for dental pain and was given amoxil. He had no procedure done at that time.  On 11-6 he comes to ED with f/c, and was started on vanco/flagyl, cefepime after he was found to have temp 103.2, tachycardia and lactate of 2.4.   His WBC was normal but his PLT was 77.  His CT abd shows cirrhosis, portal venous hypertension.  His CT maxilofacial shows sinusitis but no abscess.   Review of Systems: Review of Systems  Constitutional:  Positive for chills and fever. Negative for weight loss (intentional).  Respiratory:  Negative for cough and shortness of breath.   Gastrointestinal:  Negative for constipation and diarrhea.  Genitourinary:  Negative for dysuria.    Past Medical History:  Diagnosis Date   Cirrhosis (HCC)    Cirrhosis of liver (HCC)    Diabetes mellitus without complication (HCC)    Esophageal varices (HCC)     Vision abnormalities     Social History   Tobacco Use   Smoking status: Former   Smokeless tobacco: Never  [employer]   Vaping status: Never Used  Substance Use Topics   Alcohol use: Not Currently   Drug use: Not Currently    Family History  Problem Relation Age of Onset   Diabetes type II Mother    Heart disease Mother    Diabetes type II Father    Diabetes type II Sister      Medications: I have reviewed the patient's current medications.  Abtx:  Anti-infectives (From admission, onward)    Start     Dose/Rate Route Frequency Ordered Stop   11/12/23 2100  vancomycin (VANCOREADY) IVPB 1250 mg/250 mL        1,250 mg 166.7 mL/hr over 90 Minutes Intravenous Every 24 hours 11/12/23 0805     11/12/23 0800  ceFEPIme (MAXIPIME) 2 g in sodium chloride  0.9 % 100 mL IVPB        2 g 200 mL/hr over 30 Minutes Intravenous Every 8 hours 11/12/23 0750     11/12/23 0800  metroNIDAZOLE (FLAGYL) IVPB 500 mg        500 mg 100 mL/hr over 60 Minutes Intravenous Every 12 hours 11/12/23 0750     11/11/23 1945  vancomycin (  VANCOCIN) IVPB 1000 mg/200 mL premix  Status:  Discontinued       Placed in And Linked Group   1,000 mg 200 mL/hr over 60 Minutes Intravenous  Once 11/11/23 1931 11/11/23 1931   11/11/23 1945  vancomycin (VANCOCIN) 500 mg in sodium chloride  0.9 % 100 mL IVPB  Status:  Discontinued       Placed in And Linked Group   500 mg 100 mL/hr over 60 Minutes Intravenous  Once 11/11/23 1931 11/11/23 1932   11/11/23 1945  vancomycin (VANCOCIN) IVPB 1000 mg/200 mL premix       Placed in And Linked Group   1,000 mg 200 mL/hr over 60 Minutes Intravenous  Once 11/11/23 1932 11/11/23 2302   11/11/23 1945  vancomycin (VANCOCIN) IVPB 1000 mg/200 mL premix       Placed in And Linked Group   1,000 mg 200 mL/hr over 60 Minutes Intravenous  Once 11/11/23 1932 11/11/23 2200   11/11/23 1815  ceFEPIme (MAXIPIME) 2 g in sodium chloride  0.9 % 100 mL IVPB        2 g 200 mL/hr over 30  Minutes Intravenous  Once 11/11/23 1802 11/11/23 2012   11/11/23 1815  metroNIDAZOLE (FLAGYL) IVPB 500 mg        500 mg 100 mL/hr over 60 Minutes Intravenous  Once 11/11/23 1802 11/11/23 2030   11/11/23 1815  vancomycin (VANCOCIN) IVPB 1000 mg/200 mL premix  Status:  Discontinued        1,000 mg 200 mL/hr over 60 Minutes Intravenous  Once 11/11/23 1802 11/11/23 1805   11/11/23 1815  vancomycin (VANCOREADY) IVPB 1750 mg/350 mL  Status:  Discontinued        1,750 mg 175 mL/hr over 120 Minutes Intravenous  Once 11/11/23 1806 11/11/23 1931         OBJECTIVE: Blood pressure 117/60, pulse 77, temperature 99.3 F (37.4 C), temperature source Oral, resp. rate 19, height 5' 2 (1.575 m), weight 79.4 kg, SpO2 94%.  Physical Exam Vitals reviewed.  Constitutional:      Appearance: Normal appearance. He is obese.  HENT:     Mouth/Throat:     Mouth: Mucous membranes are moist.     Pharynx: No oropharyngeal exudate.     Comments: Poor dentition L mandibular 3rd molar, R 2nd molar Eyes:     Extraocular Movements: Extraocular movements intact.     Pupils: Pupils are equal, round, and reactive to light.  Cardiovascular:     Rate and Rhythm: Normal rate and regular rhythm.  Pulmonary:     Effort: Pulmonary effort is normal.     Breath sounds: Normal breath sounds.  Abdominal:     General: Bowel sounds are normal. There is no distension.     Palpations: Abdomen is soft.     Tenderness: There is no abdominal tenderness.  Musculoskeletal:     Cervical back: Normal range of motion and neck supple. No rigidity or tenderness.     Right lower leg: No edema.     Left lower leg: No edema.  Lymphadenopathy:     Cervical: No cervical adenopathy.  Neurological:     General: No focal deficit present.     Mental Status: He is alert.  Psychiatric:        Mood and Affect: Mood normal.     Lab Results Results for orders placed or performed during the hospital encounter of 11/11/23 (from the past  48 hours)  Culture, blood (Routine x 2)  Status: None (Preliminary result)   Collection Time: 11/11/23  5:42 PM   Specimen: BLOOD  Result Value Ref Range   Specimen Description      BLOOD LEFT ANTECUBITAL Performed at [facility], [address] [place], [place] [address]    Special Requests      BOTTLES DRAWN AEROBIC AND ANAEROBIC Blood Culture adequate volume Performed at [facility], [address], [place], [place] [address]    Culture      NO GROWTH < 12 HOURS Performed at [facility], [address], [place], [place] [address]    Report Status PENDING   Comprehensive metabolic panel     Status: Abnormal   Collection Time: 11/11/23  5:58 PM  Result Value Ref Range   Sodium 136 135 - 145 mmol/L   Potassium 3.7 3.5 - 5.1 mmol/L   Chloride 100 98 - 111 mmol/L   CO2 23 22 - 32 mmol/L   Glucose, Bld 142 (H) 70 - 99 mg/dL    Comment: Glucose reference range applies only to samples taken after fasting for at least 8 hours.   BUN 9 6 - 20 mg/dL   Creatinine, Ser 9.04 0.61 - 1.24 mg/dL   Calcium 9.5 8.9 - 89.6 mg/dL   Total Protein 7.4 6.5 - 8.1 g/dL   Albumin 4.2 3.5 - 5.0 g/dL   AST 44 (H) 15 - 41 U/L   ALT 47 (H) 0 - 44 U/L   Alkaline Phosphatase 82 38 - 126 U/L   Total Bilirubin 1.0 0.0 - 1.2 mg/dL   GFR, Estimated >39 >39 mL/min    Comment: (NOTE) Calculated using the CKD-EPI Creatinine Equation (2021)    Anion gap 13 5 - 15    Comment: Performed at [facility], [address], [place], [place] [address]  CBC with Differential     Status: Abnormal   Collection Time: 11/11/23  5:58 PM  Result Value Ref Range   WBC 5.8 4.0 - 10.5 K/uL   RBC 4.36 4.22 - 5.81 MIL/uL   Hemoglobin 14.6 13.0 - 17.0 g/dL   HCT 59.2 60.9 - 47.9 %   MCV 93.3 80.0 - 100.0 fL   MCH 33.5 26.0 - 34.0 pg   MCHC 35.9 30.0 - 36.0 g/dL   RDW 86.5 88.4 - 84.4 %   Platelets 77 (L) 150 - 400 K/uL    Comment: REPEATED TO VERIFY    nRBC 0.0 0.0 - 0.2 %   Neutrophils Relative % 89 %   Neutro Abs 5.1 1.7 - 7.7 K/uL   Lymphocytes Relative 6 %   Lymphs Abs 0.3 (L) 0.7 - 4.0 K/uL   Monocytes Relative 5 %   Monocytes Absolute 0.3 0.1 - 1.0 K/uL   Eosinophils Relative 0 %   Eosinophils Absolute 0.0 0.0 - 0.5 K/uL   Basophils Relative 0 %   Basophils Absolute 0.0 0.0 - 0.1 K/uL   Immature Granulocytes 0 %   Abs Immature Granulocytes 0.02 0.00 - 0.07 K/uL    Comment: Performed at [facility], [address], [place], [place] [address]  Protime-INR     Status: None   Collection Time: 11/11/23  5:58 PM  Result Value Ref Range   Prothrombin Time 15.0 11.4 - 15.2 seconds   INR 1.1 0.8 - 1.2    Comment: (NOTE) INR goal varies based on device and disease states. Performed at [facility], [address], [place], [place] [address]  Lactic acid, plasma     Status: Abnormal   Collection Time: 11/11/23  5:59 PM  Result Value Ref Range   Lactic Acid, Venous 2.4 (HH) 0.5 - 1.9 mmol/L    Comment: Critical Value, Read Back and verified with Franne Piano RN,11/11/2023 @ 1832 by ADELBERT Performed at [facility], [address] [place], [place] [address]   Culture, blood (Routine x 2)     Status: None (Preliminary result)   Collection Time: 11/11/23  6:35 PM   Specimen: BLOOD  Result Value Ref Range   Specimen Description      BLOOD RIGHT ANTECUBITAL Performed at [facility], [address], [place], [place] [address]    Special Requests      BOTTLES DRAWN AEROBIC AND ANAEROBIC Blood Culture adequate volume Performed at [facility], [address] [place], [place] [address]    Culture      NO GROWTH < 12 HOURS Performed at [facility], [address], [place], [place] [address]    Report Status PENDING   Resp panel by RT-PCR (RSV, Flu A&B, Covid) Anterior Nasal Swab     Status: None   Collection Time:  11/11/23  7:06 PM   Specimen: Anterior Nasal Swab  Result Value Ref Range   SARS Coronavirus 2 by RT PCR NEGATIVE NEGATIVE    Comment: (NOTE) SARS-CoV-2 target nucleic acids are NOT DETECTED.  The SARS-CoV-2 RNA is generally detectable in upper respiratory specimens during the acute phase of infection. The lowest concentration of SARS-CoV-2 viral copies this assay can detect is 138 copies/mL. A negative result does not preclude SARS-Cov-2 infection and should not be used as the sole basis for treatment or other patient management decisions. A negative result may occur with  improper specimen collection/handling, submission of specimen other than nasopharyngeal swab, presence of viral mutation(s) within the areas targeted by this assay, and inadequate number of viral copies(<138 copies/mL). A negative result must be combined with clinical observations, patient history, and epidemiological information. The expected result is Negative.  Fact Sheet for Patients:  bloggercourse.com  Fact Sheet for Healthcare Providers:  seriousbroker.it  This test is no t yet approved or cleared by the [place]  FDA and  has been authorized for detection and/or diagnosis of SARS-CoV-2 by FDA under an Emergency Use Authorization (EUA). This EUA will remain  in effect (meaning this test can be used) for the duration of the COVID-19 declaration under Section 564(b)(1) of the Act, 21 U.S.C.section 360bbb-3(b)(1), unless the authorization is terminated  or revoked sooner.       Influenza A by PCR NEGATIVE NEGATIVE   Influenza B by PCR NEGATIVE NEGATIVE    Comment: (NOTE) The Xpert Xpress SARS-CoV-2/FLU/RSV plus assay is intended as an aid in the diagnosis of influenza from Nasopharyngeal swab specimens and should not be used as a sole basis for treatment. Nasal washings and aspirates are unacceptable for Xpert Xpress  SARS-CoV-2/FLU/RSV testing.  Fact Sheet for Patients: bloggercourse.com  Fact Sheet for Healthcare Providers: seriousbroker.it  This test is not yet approved or cleared by the [place]  FDA and has been authorized for detection and/or diagnosis of SARS-CoV-2 by FDA under an Emergency Use Authorization (EUA). This EUA will remain in effect (meaning this test can be used) for the duration of the COVID-19 declaration under Section 564(b)(1) of the Act, 21 U.S.C. section 360bbb-3(b)(1), unless the authorization is terminated or revoked.     Resp Syncytial Virus by PCR NEGATIVE NEGATIVE  Comment: (NOTE) Fact Sheet for Patients: bloggercourse.com  Fact Sheet for Healthcare Providers: seriousbroker.it  This test is not yet approved or cleared by the [place]  FDA and has been authorized for detection and/or diagnosis of SARS-CoV-2 by FDA under an Emergency Use Authorization (EUA). This EUA will remain in effect (meaning this test can be used) for the duration of the COVID-19 declaration under Section 564(b)(1) of the Act, 21 U.S.C. section 360bbb-3(b)(1), unless the authorization is terminated or revoked.  Performed at [facility], [address], [place], [place] [address]   Urinalysis, w/ Reflex to Culture (Infection Suspected) -Urine, Clean Catch     Status: Abnormal   Collection Time: 11/11/23  7:32 PM  Result Value Ref Range   Specimen Source URINE, CATHETERIZED    Color, Urine YELLOW YELLOW   APPearance CLEAR CLEAR   Specific Gravity, Urine 1.010 1.005 - 1.030   pH 6.0 5.0 - 8.0   Glucose, UA >1,000 (A) NEGATIVE mg/dL   Hgb urine dipstick NEGATIVE NEGATIVE   Bilirubin Urine NEGATIVE NEGATIVE   Ketones, ur NEGATIVE NEGATIVE mg/dL   Protein, ur NEGATIVE NEGATIVE mg/dL   Nitrite NEGATIVE NEGATIVE   Leukocytes,Ua NEGATIVE NEGATIVE   RBC / HPF  0-5 0 - 5 RBC/hpf   WBC, UA 0-5 0 - 5 WBC/hpf    Comment:        Reflex urine culture not performed if WBC <=10, OR if Squamous epithelial cells >5. If Squamous epithelial cells >5 suggest recollection.    Bacteria, UA NONE SEEN NONE SEEN   Squamous Epithelial / HPF 0-5 0 - 5 /HPF    Comment: Performed at [facility], [address] [place], [place] [address]  Lactic acid, plasma     Status: Abnormal   Collection Time: 11/11/23  8:04 PM  Result Value Ref Range   Lactic Acid, Venous 2.4 (HH) 0.5 - 1.9 mmol/L    Comment: Critical value noted. Value is consistent with previously reported and called value  Performed at [facility], [address], [place], [place] [address]   CBG monitoring, ED     Status: Abnormal   Collection Time: 11/12/23  9:51 AM  Result Value Ref Range   Glucose-Capillary 100 (H) 70 - 99 mg/dL    Comment: Glucose reference range applies only to samples taken after fasting for at least 8 hours.      Component Value Date/Time   SDES  11/11/2023 1835    BLOOD RIGHT ANTECUBITAL Performed at [facility], [address], [place], [place] [address]    [facility]  11/11/2023 1835    BOTTLES DRAWN AEROBIC AND ANAEROBIC Blood Culture adequate volume Performed at [facility], [address] [place], [place] [address]    CULT  11/11/2023 1835    NO GROWTH < 12 HOURS Performed at [facility], [address], [place], [place] [address]    REPTSTATUS PENDING 11/11/2023 1835   US  Abdomen Limited RUQ (LIVER/GB) Result Date: 11/11/2023 CLINICAL DATA:  Abdominal pain EXAM: ULTRASOUND ABDOMEN LIMITED RIGHT UPPER QUADRANT COMPARISON:  Ultrasound 12/16/2020 FINDINGS: Gallbladder: No obvious shadowing stones. Slight increased wall thickness at 4.9 mm, negative sonographic Murphy. Limited visibility Common bile duct: Diameter: 4.6 mm Liver: Liver cirrhosis. Limited visualization. Portal  vein is patent on color imaging with biphasic flow/pulsatile flow Other: None. IMPRESSION: 1. Limited study secondary to habitus and limited windows. Liver cirrhosis. 2. Slight increased gallbladder wall thickness without visible stones. Negative sonographic Murphy. Findings  are nonspecific and could be secondary to liver disease, or edematous state. If further imaging is required, suggest correlation with nuclear medicine hepatobiliary imaging. Electronically Signed   By: Luke Bun M.D.   On: 11/11/2023 22:46   CT Soft Tissue Neck W Contrast Result Date: 11/11/2023 EXAM: CT NECK WITH CONTRAST 11/11/2023 08:54:37 PM TECHNIQUE: CT of the neck was performed with the administration of intravenous contrast. Multiplanar reformatted images are provided for review. Automated exposure control, iterative reconstruction, and/or weight based adjustment of the mA/kV was utilized to reduce the radiation dose to as low as reasonably achievable. COMPARISON: None available. CLINICAL HISTORY: Soft tissue infection suspected, neck, xray done; recent esophageal banding now presenting with fever unclear origin - evaluation for esophageal perforation or abscess. FINDINGS: AERODIGESTIVE TRACT: No discrete mass. No edema. SALIVARY GLANDS: The parotid and submandibular glands are unremarkable. THYROID: Unremarkable. LYMPH NODES: No suspicious cervical lymphadenopathy. SOFT TISSUES: No mass or fluid collection. BRAIN, ORBITS, SINUSES AND MASTOIDS: Opacified right maxillary sinus. LUNGS AND MEDIASTINUM: No acute abnormality. BONES: No focal bone abnormality. IMPRESSION: 1. No evidence of abscess or acute abnormality in the neck. 2. Opacified right maxillary sinus. Electronically signed by: Gilmore Molt MD 11/11/2023 09:17 PM EST RP Workstation: [id number]   CT Chest W Contrast Result Date: 11/11/2023 CLINICAL DATA:  Sepsis, infection EXAM: CT CHEST WITH CONTRAST TECHNIQUE: Multidetector CT imaging of the chest was performed  during intravenous contrast administration. RADIATION DOSE REDUCTION: This exam was performed according to the departmental dose-optimization program which includes automated exposure control, adjustment of the mA and/or kV according to patient size and/or use of iterative reconstruction technique. CONTRAST:  75mL OMNIPAQUE  IOHEXOL  300 MG/ML  SOLN COMPARISON:  11/11/2023 FINDINGS: Cardiovascular: The heart is unremarkable without pericardial effusion. No evidence of thoracic aortic aneurysm or dissection. Atherosclerosis of the aorta and coronary vasculature. Mediastinum/Nodes: No enlarged mediastinal, hilar, or axillary lymph nodes. Thyroid gland, trachea, and esophagus demonstrate no significant findings. Small hiatal hernia. Lungs/Pleura: No acute airspace disease, effusion, or pneumothorax. Minimal right lower lobe atelectasis. Central airways are patent. Upper Abdomen: Cirrhotic morphology of the liver. Mild gallbladder wall thickening measuring 4 mm. If there is concern for gallbladder pathology, right upper quadrant ultrasound may be useful. The spleen is incompletely imaged due to slice selection, but is likely enlarged. Sequela of portal venous hypertension with upper abdominal varices, most pronounced within the distal esophagus. Musculoskeletal: No acute or destructive bony abnormalities. Reconstructed images demonstrate no additional findings. IMPRESSION: 1. No acute intrathoracic process. 2. Cirrhosis, with portal venous hypertension manifested by splenomegaly and upper abdominal varices. 3. Mild gallbladder wall thickening, mild specific in this patient with cirrhosis. If gallbladder pathology is suspected, right upper quadrant ultrasound could be performed. 4. Small hiatal hernia. 5. Aortic Atherosclerosis (ICD10-I70.0). Coronary artery atherosclerosis. Electronically Signed   By: Ozell Daring M.D.   On: 11/11/2023 21:04   DG Chest 2 View Result Date: 11/11/2023 EXAM: 2 VIEW(S) XRAY OF THE CHEST  11/11/2023 06:26:00 PM COMPARISON: 3 / 27 / 20 CLINICAL HISTORY: Fever. FINDINGS: LUNGS AND PLEURA: No focal pulmonary opacity. No pulmonary edema. No pleural effusion. No pneumothorax. HEART AND MEDIASTINUM: No acute abnormality of the cardiac and mediastinal silhouettes. BONES AND SOFT TISSUES: No acute osseous abnormality. IMPRESSION: 1. No acute process. Electronically signed by: Norman Gatlin MD 11/11/2023 06:31 PM EST RP Workstation: [id number]   Recent Results (from the past 240 hours)  Culture, blood (Routine x 2)     Status: None (Preliminary result)   Collection Time:  11/11/23  5:42 PM   Specimen: BLOOD  Result Value Ref Range Status   Specimen Description   Final    BLOOD LEFT ANTECUBITAL Performed at [facility], [address], [place], [place] [address]    Special Requests   Final    BOTTLES DRAWN AEROBIC AND ANAEROBIC Blood Culture adequate volume Performed at [facility], [address] [place], [place] [address]    Culture   Final    NO GROWTH < 12 HOURS Performed at [facility], [address] [place], [place] [address]    Report Status PENDING  Incomplete  Culture, blood (Routine x 2)     Status: None (Preliminary result)   Collection Time: 11/11/23  6:35 PM   Specimen: BLOOD  Result Value Ref Range Status   Specimen Description   Final    BLOOD RIGHT ANTECUBITAL Performed at [facility], [address] [place], [place] [address]    Special Requests   Final    BOTTLES DRAWN AEROBIC AND ANAEROBIC Blood Culture adequate volume Performed at [facility], [address] [place], [place] [address]    Culture   Final    NO GROWTH < 12 HOURS Performed at [facility], [address], [place], [place] [address]    Report Status PENDING  Incomplete  Resp panel by RT-PCR (RSV, Flu A&B, Covid) Anterior Nasal Swab     Status: None   Collection Time: 11/11/23  7:06 PM    Specimen: Anterior Nasal Swab  Result Value Ref Range Status   SARS Coronavirus 2 by RT PCR NEGATIVE NEGATIVE Final    Comment: (NOTE) SARS-CoV-2 target nucleic acids are NOT DETECTED.  The SARS-CoV-2 RNA is generally detectable in upper respiratory specimens during the acute phase of infection. The lowest concentration of SARS-CoV-2 viral copies this assay can detect is 138 copies/mL. A negative result does not preclude SARS-Cov-2 infection and should not be used as the sole basis for treatment or other patient management decisions. A negative result may occur with  improper specimen collection/handling, submission of specimen other than nasopharyngeal swab, presence of viral mutation(s) within the areas targeted by this assay, and inadequate number of viral copies(<138 copies/mL). A negative result must be combined with clinical observations, patient history, and epidemiological information. The expected result is Negative.  Fact Sheet for Patients:  bloggercourse.com  Fact Sheet for Healthcare Providers:  seriousbroker.it  This test is no t yet approved or cleared by the [place]  FDA and  has been authorized for detection and/or diagnosis of SARS-CoV-2 by FDA under an Emergency Use Authorization (EUA). This EUA will remain  in effect (meaning this test can be used) for the duration of the COVID-19 declaration under Section 564(b)(1) of the Act, 21 U.S.C.section 360bbb-3(b)(1), unless the authorization is terminated  or revoked sooner.       Influenza A by PCR NEGATIVE NEGATIVE Final   Influenza B by PCR NEGATIVE NEGATIVE Final    Comment: (NOTE) The Xpert Xpress SARS-CoV-2/FLU/RSV plus assay is intended as an aid in the diagnosis of influenza from Nasopharyngeal swab specimens and should not be used as a sole basis for treatment. Nasal washings and aspirates are unacceptable for Xpert Xpress  SARS-CoV-2/FLU/RSV testing.  Fact Sheet for Patients: bloggercourse.com  Fact Sheet for Healthcare Providers: seriousbroker.it  This test is not yet approved or cleared by the [place]  FDA and has been authorized for detection and/or diagnosis of SARS-CoV-2 by FDA under  an Emergency Use Authorization (EUA). This EUA will remain in effect (meaning this test can be used) for the duration of the COVID-19 declaration under Section 564(b)(1) of the Act, 21 U.S.C. section 360bbb-3(b)(1), unless the authorization is terminated or revoked.     Resp Syncytial Virus by PCR NEGATIVE NEGATIVE Final    Comment: (NOTE) Fact Sheet for Patients: bloggercourse.com  Fact Sheet for Healthcare Providers: seriousbroker.it  This test is not yet approved or cleared by the [place]  FDA and has been authorized for detection and/or diagnosis of SARS-CoV-2 by FDA under an Emergency Use Authorization (EUA). This EUA will remain in effect (meaning this test can be used) for the duration of the COVID-19 declaration under Section 564(b)(1) of the Act, 21 U.S.C. section 360bbb-3(b)(1), unless the authorization is terminated or revoked.  Performed at [facility], [address] [place], [place] [address]     Microbiology: Recent Results (from the past 240 hours)  Culture, blood (Routine x 2)     Status: None (Preliminary result)   Collection Time: 11/11/23  5:42 PM   Specimen: BLOOD  Result Value Ref Range Status   Specimen Description   Final    BLOOD LEFT ANTECUBITAL Performed at [facility], [address], [place], [place] [address]    Special Requests   Final    BOTTLES DRAWN AEROBIC AND ANAEROBIC Blood Culture adequate volume Performed at [facility], [address], [place], [place] [address]    Culture   Final    NO  GROWTH < 12 HOURS Performed at [facility], [address] [place], [place] [address]    Report Status PENDING  Incomplete  Culture, blood (Routine x 2)     Status: None (Preliminary result)   Collection Time: 11/11/23  6:35 PM   Specimen: BLOOD  Result Value Ref Range Status   Specimen Description   Final    BLOOD RIGHT ANTECUBITAL Performed at [facility], [address], [place], [place] [address]    Special Requests   Final    BOTTLES DRAWN AEROBIC AND ANAEROBIC Blood Culture adequate volume Performed at [facility], [address] [place], [place] [address]    Culture   Final    NO GROWTH < 12 HOURS Performed at [facility], [address] [place], [place] [address]    Report Status PENDING  Incomplete  Resp panel by RT-PCR (RSV, Flu A&B, Covid) Anterior Nasal Swab     Status: None   Collection Time: 11/11/23  7:06 PM   Specimen: Anterior Nasal Swab  Result Value Ref Range Status   SARS Coronavirus 2 by RT PCR NEGATIVE NEGATIVE Final    Comment: (NOTE) SARS-CoV-2 target nucleic acids are NOT DETECTED.  The SARS-CoV-2 RNA is generally detectable in upper respiratory specimens during the acute phase of infection. The lowest concentration of SARS-CoV-2 viral copies this assay can detect is 138 copies/mL. A negative result does not preclude SARS-Cov-2 infection and should not be used as the sole basis for treatment or other patient management decisions. A negative result may occur with  improper specimen collection/handling, submission of specimen other than nasopharyngeal swab, presence of viral mutation(s) within the areas targeted by this assay, and inadequate number of viral copies(<138 copies/mL). A negative result must be combined with clinical observations, patient history, and epidemiological information. The expected result is Negative.  Fact Sheet for Patients:   bloggercourse.com  Fact Sheet for Healthcare Providers:  seriousbroker.it  This test is no t yet approved or cleared by the [place]  FDA and  has been authorized for detection and/or diagnosis of SARS-CoV-2 by FDA under an Emergency Use Authorization (EUA). This EUA will remain  in effect (meaning this test can be used) for the duration of the COVID-19 declaration under Section 564(b)(1) of the Act, 21 U.S.C.section 360bbb-3(b)(1), unless the authorization is terminated  or revoked sooner.       Influenza A by PCR NEGATIVE NEGATIVE Final   Influenza B by PCR NEGATIVE NEGATIVE Final    Comment: (NOTE) The Xpert Xpress SARS-CoV-2/FLU/RSV plus assay is intended as an aid in the diagnosis of influenza from Nasopharyngeal swab specimens and should not be used as a sole basis for treatment. Nasal washings and aspirates are unacceptable for Xpert Xpress SARS-CoV-2/FLU/RSV testing.  Fact Sheet for Patients: bloggercourse.com  Fact Sheet for Healthcare Providers: seriousbroker.it  This test is not yet approved or cleared by the [place]  FDA and has been authorized for detection and/or diagnosis of SARS-CoV-2 by FDA under an Emergency Use Authorization (EUA). This EUA will remain in effect (meaning this test can be used) for the duration of the COVID-19 declaration under Section 564(b)(1) of the Act, 21 U.S.C. section 360bbb-3(b)(1), unless the authorization is terminated or revoked.     Resp Syncytial Virus by PCR NEGATIVE NEGATIVE Final    Comment: (NOTE) Fact Sheet for Patients: bloggercourse.com  Fact Sheet for Healthcare Providers: seriousbroker.it  This test is not yet approved or cleared by the [place]  FDA and has been authorized for detection and/or diagnosis of SARS-CoV-2 by FDA under an Emergency Use  Authorization (EUA). This EUA will remain in effect (meaning this test can be used) for the duration of the COVID-19 declaration under Section 564(b)(1) of the Act, 21 U.S.C. section 360bbb-3(b)(1), unless the authorization is terminated or revoked.  Performed at [facility], [address], [place], [place] [address]     Radiographs and labs were personally reviewed by me.   Reyes Fenton, MD [facility] [phone] 11/12/2023, 4:34 PM

## 2023-11-12 NOTE — ED Notes (Signed)
 Called [facility] to transport the patient to [facility] 6N rm# [phone]

## 2023-11-12 NOTE — Progress Notes (Signed)
 Admit for sepsis, fever of unknown etiology, concern for bacteremia. Came in with fever and left lower dental pain. He is s/p esophageal banding 2 days ago CT soft tissue and chest wo obvious source for infection, no dental abscces. He doesn't have elevated WBCS but was very febrile at 103, tachy.  He was placed on broad spectum abx for coverage . Bed order placed to tele bed at [facility].

## 2023-11-12 NOTE — ED Notes (Signed)
 Patient reporting worsening headache and requesting medicine for same. Secure chat sent to ED provider relaying patient request.

## 2023-11-12 NOTE — ED Notes (Signed)
 Report called to Bella, CHARITY FUNDRAISER at [facility].

## 2023-11-12 NOTE — ED Notes (Signed)
 Report received from Santia, [place]. Patient care assumed at this time.

## 2023-11-12 NOTE — Progress Notes (Signed)
 Pharmacy Antibiotic Note  Hunter Xia. is a 53 y.o. male admitted on 11/11/2023 with sepsis.  Pharmacy has been consulted for vancomycin dosing. Pt is febrile with Tmax 103.2 but WBC is WNL. Lactic acid is elevated at 2.4. Pt received first doses of all antibiotics last night.  Plan: Vancomycin 1250mg  IV Q24H  F/u renal fxn, C&S, clinical status and peak/trough at SS  Height: 5' 2 (157.5 cm) Weight: 79.4 kg (175 lb) IBW/kg (Calculated) : 54.6  Temp (24hrs), Avg:101.2 F (38.4 C), Min:98.6 F (37 C), Max:103.2 F (39.6 C)  Recent Labs  Lab 11/11/23 1758 11/11/23 1759 11/11/23 2004  WBC 5.8  --   --   CREATININE 0.95  --   --   LATICACIDVEN  --  2.4* 2.4*    Estimated Creatinine Clearance: 82 mL/min (by C-G formula based on SCr of 0.95 mg/dL).    Allergies  Allergen Reactions   Glimepiride Other (See Comments)    Gas and weight gain   Duloxetine  Hcl Nausea Only    Generic caused side effects    Antimicrobials this admission: Vanc 11/6>> Cefepime 11/6>> Flagyl 11/6>>  Dose adjustments this admission: N/A  Microbiology results: 11/6 BCx: NGTD  Thank you for allowing pharmacy to be a part of this patient's care.  Hunter Gray, Vernell Helling 11/12/2023 8:05 AM

## 2023-11-13 DIAGNOSIS — A419 Sepsis, unspecified organism: Secondary | ICD-10-CM | POA: Diagnosis not present

## 2023-11-13 LAB — CBC WITH DIFFERENTIAL/PLATELET
Abs Immature Granulocytes: 0.01 K/uL (ref 0.00–0.07)
Basophils Absolute: 0 K/uL (ref 0.0–0.1)
Basophils Relative: 0 %
Eosinophils Absolute: 0.1 K/uL (ref 0.0–0.5)
Eosinophils Relative: 1 %
HCT: 37 % — ABNORMAL LOW (ref 39.0–52.0)
Hemoglobin: 13.1 g/dL (ref 13.0–17.0)
Immature Granulocytes: 0 %
Lymphocytes Relative: 21 %
Lymphs Abs: 1.1 K/uL (ref 0.7–4.0)
MCH: 33.4 pg (ref 26.0–34.0)
MCHC: 35.4 g/dL (ref 30.0–36.0)
MCV: 94.4 fL (ref 80.0–100.0)
Monocytes Absolute: 0.5 K/uL (ref 0.1–1.0)
Monocytes Relative: 10 %
Neutro Abs: 3.4 K/uL (ref 1.7–7.7)
Neutrophils Relative %: 68 %
Platelets: 64 K/uL — ABNORMAL LOW (ref 150–400)
RBC: 3.92 MIL/uL — ABNORMAL LOW (ref 4.22–5.81)
RDW: 14 % (ref 11.5–15.5)
WBC: 5.1 K/uL (ref 4.0–10.5)
nRBC: 0 % (ref 0.0–0.2)

## 2023-11-13 LAB — COMPREHENSIVE METABOLIC PANEL WITH GFR
ALT: 36 U/L (ref 0–44)
AST: 37 U/L (ref 15–41)
Albumin: 2.7 g/dL — ABNORMAL LOW (ref 3.5–5.0)
Alkaline Phosphatase: 49 U/L (ref 38–126)
Anion gap: 8 (ref 5–15)
BUN: 10 mg/dL (ref 6–20)
CO2: 24 mmol/L (ref 22–32)
Calcium: 8.4 mg/dL — ABNORMAL LOW (ref 8.9–10.3)
Chloride: 107 mmol/L (ref 98–111)
Creatinine, Ser: 0.95 mg/dL (ref 0.61–1.24)
GFR, Estimated: >60 mL/min (ref >60)
Glucose, Bld: 127 mg/dL — ABNORMAL HIGH (ref 70–99)
Potassium: 3.6 mmol/L (ref 3.5–5.1)
Sodium: 139 mmol/L (ref 135–145)
Total Bilirubin: 0.7 mg/dL (ref 0.0–1.2)
Total Protein: 5.8 g/dL — ABNORMAL LOW (ref 6.5–8.1)

## 2023-11-13 LAB — GLUCOSE, CAPILLARY
Glucose-Capillary: 120 mg/dL — ABNORMAL HIGH (ref 70–99)
Glucose-Capillary: 111 mg/dL — ABNORMAL HIGH (ref 70–99)
Glucose-Capillary: 103 mg/dL — ABNORMAL HIGH (ref 70–99)
Glucose-Capillary: 117 mg/dL — ABNORMAL HIGH (ref 70–99)

## 2023-11-13 LAB — RESPIRATORY PANEL BY PCR

## 2023-11-13 LAB — LACTIC ACID, PLASMA
Lactic Acid, Venous: 1.9 mmol/L (ref 0.5–1.9)
Lactic Acid, Venous: 1.4 mmol/L (ref 0.5–1.9)

## 2023-11-13 LAB — PROCALCITONIN: Procalcitonin: 0.38 ng/mL

## 2023-11-13 LAB — HEMOGLOBIN A1C
Hgb A1c MFr Bld: 4.7 % — ABNORMAL LOW (ref 4.8–5.6)
Mean Plasma Glucose: 88.19 mg/dL

## 2023-11-13 LAB — HIV ANTIBODY (ROUTINE TESTING W REFLEX): HIV Screen 4th Generation wRfx: NONREACTIVE

## 2023-11-13 NOTE — Plan of Care (Signed)

## 2023-11-13 NOTE — Progress Notes (Signed)
   11/13/23 9047  Spiritual Encounters  Type of Visit Initial  Care provided to: Patient;Family  Conversation partners present during encounter Nurse  Reason for visit Advance directives  OnCall Visit No   This chaplain visited the patient for the purpose of getting an Advance Directive document completed. His wife was with him so they were both educated in the process and I answered their questions. The wife took a second copy of the paperwork to fill out for herself so they both will have them on file. They will ask the nurse to page a chaplain during normal business hours to complete the document with a notary and witnesses.

## 2023-11-13 NOTE — Progress Notes (Signed)
 PROGRESS NOTE  Hunter Gray Vania Mickey. FMW:[id number] DOB: [birth year] DOA: 11/11/2023 PCP: Allen Lauraine CROME, PA-C   HPI/Recap of past 24 hours: Hunter Gray Kien Mirsky. is a 53 y.o. male with medical history significant of cirrhosis of liver (s/p recent esophageal banding on 11/09/23, follows w/ Dr. Alejandra), and DM2 p/w fever and hypotension c/f sepsis.  Patient with a possible dental caries, saw his dentist on 11/10/2023 for left lower molar infection, was given amoxicillin and plan for ED tooth extraction.  Patient complained of fever, chills, and some congestion with tooth pain. In [facility] ED, pt febrile, tachycardic ,and hypotensive. Labs notable for lactic acid 2.4. CT chest and CT soft neck w/o acute findings. CXR w/o acute process.  Patient admitted for further management.    Today, patient still with temp spikes, reports headache and tooth ache.  Patient denies any chest pain, abdominal pain, nausea/vomiting.  Discussed with wife at bedside.    Assessment/Plan: Principal Problem:   Sepsis due to undetermined organism [facility])   Severe sepsis of unknown etiology Meets sepsis criteria with fever, tachycardia, lactic acidosis  Last time spike on 11/7, with no leukocytosis BC x 2 pending Procalcitonin 0.38 Lactic acidosis 2.4--1.4 UA unremarkable COVID, flu, RSV as well as respiratory viral panel all negative Chest x-ray negative CT soft tissue neck with no noted dental abscess (discussed with Dr. Perley radiologist on 11/8) CT chest with no acute intrathoracic process, noted cirrhosis with portal venous hypertension with splenomegaly and upper abdominal varices, noted some mild gallbladder wall thickening Right upper quadrant ultrasound showed slight increased gallbladder wall thickness without visible stones, nonspecific findings could be related to liver disease.  If further imaging required, consider HIDA scan May consider HIDA scan if significant abdominal pain or worsening clinical  status Infectious disease on board, appreciate recs Continue IV Unasyn, vancomycin Telemetry Monitor closely  Hypertension BP stable for now Hold losartan for now  Diabetes mellitus type 2 Last A1c 4.7 Continue SSI, Accu-Cheks, hypoglycemic protocol  Liver cirrhosis with esophageal varices Chronic thrombocytopenia Appears compensated S/p variceal banding on 11/09/2023 Continue pantoprazole  Monitor closely  Obesity class I Lifestyle modification advised    Estimated body mass index is 32.01 kg/m as calculated from the following:   Height as of this encounter: 5' 2 (1.575 m).   Weight as of this encounter: 79.4 kg.     Code Status: Full  Family Communication: Wife at bedside  Disposition Plan: Status is: Inpatient Remains inpatient appropriate because: Level of care      Consultants: Infectious disease  Procedures: None  Antimicrobials: Unasyn, vancomycin  DVT prophylaxis: SCDs   Objective: Vitals:   11/12/23 2150 11/12/23 2322 11/13/23 0513 11/13/23 1006  BP: (!) 110/58  (!) 102/57 124/62  Pulse:   65 (!) 59  Resp:   17 16  Temp:  100 F (37.8 C) 98.4 F (36.9 C) 98.4 F (36.9 C)  TempSrc:  Oral Oral Oral  SpO2: 96%  98% 97%  Weight:      Height:        Intake/Output Summary (Last 24 hours) at 11/13/2023 1348 Last data filed at 11/13/2023 0800 Gross per 24 hour  Intake 2172.86 ml  Output --  Net 2172.86 ml   Filed Weights   11/11/23 1737  Weight: 79.4 kg    Exam: General: NAD  Cardiovascular: S1, S2 present Respiratory: CTAB Abdomen: Soft, nontender, nondistended, bowel sounds present Musculoskeletal: No bilateral pedal edema noted Skin: Normal Psychiatry: Normal mood  Data Reviewed: CBC: Recent Labs  Lab 11/11/23 1758 11/13/23 0824  WBC 5.8 5.1  NEUTROABS 5.1 3.4  HGB 14.6 13.1  HCT 40.7 37.0*  MCV 93.3 94.4  PLT 77* 64*   Basic Metabolic Panel: Recent Labs  Lab 11/11/23 1758 11/13/23 0824  NA 136 139  K  3.7 3.6  CL 100 107  CO2 23 24  GLUCOSE 142* 127*  BUN 9 10  CREATININE 0.95 0.95  CALCIUM 9.5 8.4*   GFR: Estimated Creatinine Clearance: 82 mL/min (by C-G formula based on SCr of 0.95 mg/dL). Liver Function Tests: Recent Labs  Lab 11/11/23 1758 11/13/23 0824  AST 44* 37  ALT 47* 36  ALKPHOS 82 49  BILITOT 1.0 0.7  PROT 7.4 5.8*  ALBUMIN 4.2 2.7*   No results for input(s): LIPASE, AMYLASE in the last 168 hours. No results for input(s): AMMONIA in the last 168 hours. Coagulation Profile: Recent Labs  Lab 11/11/23 1758  INR 1.1   Cardiac Enzymes: No results for input(s): CKTOTAL, CKMB, CKMBINDEX, TROPONINI in the last 168 hours. BNP (last 3 results) No results for input(s): PROBNP in the last 8760 hours. HbA1C: Recent Labs    11/13/23 0534  HGBA1C 4.7*   CBG: Recent Labs  Lab 11/12/23 0951 11/12/23 1707 11/12/23 2054 11/13/23 0820 11/13/23 1154  GLUCAP 100* 140* 90 120* 111*   Lipid Profile: No results for input(s): CHOL, HDL, LDLCALC, TRIG, CHOLHDL, LDLDIRECT in the last 72 hours. Thyroid Function Tests: No results for input(s): TSH, T4TOTAL, FREET4, T3FREE, THYROIDAB in the last 72 hours. Anemia Panel: No results for input(s): VITAMINB12, FOLATE, FERRITIN, TIBC, IRON, RETICCTPCT in the last 72 hours. Urine analysis:    Component Value Date/Time   COLORURINE YELLOW 11/11/2023 1932   APPEARANCEUR CLEAR 11/11/2023 1932   LABSPEC 1.010 11/11/2023 1932   PHURINE 6.0 11/11/2023 1932   GLUCOSEU >1,000 (A) 11/11/2023 1932   HGBUR NEGATIVE 11/11/2023 1932   BILIRUBINUR NEGATIVE 11/11/2023 1932   KETONESUR NEGATIVE 11/11/2023 1932   PROTEINUR NEGATIVE 11/11/2023 1932   NITRITE NEGATIVE 11/11/2023 1932   LEUKOCYTESUR NEGATIVE 11/11/2023 1932   Sepsis Labs: @LABRCNTIP (procalcitonin:4,lacticidven:4)  ) Recent Results (from the past 240 hours)  Culture, blood (Routine x 2)     Status: None (Preliminary  result)   Collection Time: 11/11/23  5:42 PM   Specimen: BLOOD  Result Value Ref Range Status   Specimen Description   Final    BLOOD LEFT ANTECUBITAL Performed at [facility], [address] [place], [place] [address]    Special Requests   Final    BOTTLES DRAWN AEROBIC AND ANAEROBIC Blood Culture adequate volume Performed at [facility], [address], [place], [place] [address]    Culture   Final    NO GROWTH 2 DAYS Performed at [facility], [address] [place], [place] [address]    Report Status PENDING  Incomplete  Culture, blood (Routine x 2)     Status: None (Preliminary result)   Collection Time: 11/11/23  6:35 PM   Specimen: BLOOD  Result Value Ref Range Status   Specimen Description   Final    BLOOD RIGHT ANTECUBITAL Performed at [facility], [address], [place], [place] [address]    Special Requests   Final    BOTTLES DRAWN AEROBIC AND ANAEROBIC Blood Culture adequate volume Performed at [facility], [address] [place], [place] [address]    Culture   Final    NO  GROWTH 2 DAYS Performed at [facility], [address], [place], [place] [address]    Report Status PENDING  Incomplete  Resp panel by RT-PCR (RSV, Flu A&B, Covid) Anterior Nasal Swab     Status: None   Collection Time: 11/11/23  7:06 PM   Specimen: Anterior Nasal Swab  Result Value Ref Range Status   SARS Coronavirus 2 by RT PCR NEGATIVE NEGATIVE Final    Comment: (NOTE) SARS-CoV-2 target nucleic acids are NOT DETECTED.  The SARS-CoV-2 RNA is generally detectable in upper respiratory specimens during the acute phase of infection. The lowest concentration of SARS-CoV-2 viral copies this assay can detect is 138 copies/mL. A negative result does not preclude SARS-Cov-2 infection and should not be used as the sole basis for treatment or other patient management decisions. A negative result may  occur with  improper specimen collection/handling, submission of specimen other than nasopharyngeal swab, presence of viral mutation(s) within the areas targeted by this assay, and inadequate number of viral copies(<138 copies/mL). A negative result must be combined with clinical observations, patient history, and epidemiological information. The expected result is Negative.  Fact Sheet for Patients:  bloggercourse.com  Fact Sheet for Healthcare Providers:  seriousbroker.it  This test is no t yet approved or cleared by the [place]  FDA and  has been authorized for detection and/or diagnosis of SARS-CoV-2 by FDA under an Emergency Use Authorization (EUA). This EUA will remain  in effect (meaning this test can be used) for the duration of the COVID-19 declaration under Section 564(b)(1) of the Act, 21 U.S.C.section 360bbb-3(b)(1), unless the authorization is terminated  or revoked sooner.       Influenza A by PCR NEGATIVE NEGATIVE Final   Influenza B by PCR NEGATIVE NEGATIVE Final    Comment: (NOTE) The Xpert Xpress SARS-CoV-2/FLU/RSV plus assay is intended as an aid in the diagnosis of influenza from Nasopharyngeal swab specimens and should not be used as a sole basis for treatment. Nasal washings and aspirates are unacceptable for Xpert Xpress SARS-CoV-2/FLU/RSV testing.  Fact Sheet for Patients: bloggercourse.com  Fact Sheet for Healthcare Providers: seriousbroker.it  This test is not yet approved or cleared by the [place]  FDA and has been authorized for detection and/or diagnosis of SARS-CoV-2 by FDA under an Emergency Use Authorization (EUA). This EUA will remain in effect (meaning this test can be used) for the duration of the COVID-19 declaration under Section 564(b)(1) of the Act, 21 U.S.C. section 360bbb-3(b)(1), unless the authorization is terminated  or revoked.     Resp Syncytial Virus by PCR NEGATIVE NEGATIVE Final    Comment: (NOTE) Fact Sheet for Patients: bloggercourse.com  Fact Sheet for Healthcare Providers: seriousbroker.it  This test is not yet approved or cleared by the [place]  FDA and has been authorized for detection and/or diagnosis of SARS-CoV-2 by FDA under an Emergency Use Authorization (EUA). This EUA will remain in effect (meaning this test can be used) for the duration of the COVID-19 declaration under Section 564(b)(1) of the Act, 21 U.S.C. section 360bbb-3(b)(1), unless the authorization is terminated or revoked.  Performed at [facility], [address], [place], [place] [address]   Respiratory (~20 pathogens) panel by PCR     Status: None   Collection Time: 11/13/23  7:04 AM   Specimen: Nasopharyngeal Swab; Respiratory  Result Value Ref Range Status   Adenovirus NOT DETECTED NOT DETECTED Final   Coronavirus 229E NOT DETECTED NOT DETECTED Final    Comment: (NOTE) The  Coronavirus on the Respiratory Panel, DOES NOT test for the novel  Coronavirus (2019 nCoV)    Coronavirus HKU1 NOT DETECTED NOT DETECTED Final   Coronavirus NL63 NOT DETECTED NOT DETECTED Final   Coronavirus OC43 NOT DETECTED NOT DETECTED Final   Metapneumovirus NOT DETECTED NOT DETECTED Final   Rhinovirus / Enterovirus NOT DETECTED NOT DETECTED Final   Influenza A NOT DETECTED NOT DETECTED Final   Influenza B NOT DETECTED NOT DETECTED Final   Parainfluenza Virus 1 NOT DETECTED NOT DETECTED Final   Parainfluenza Virus 2 NOT DETECTED NOT DETECTED Final   Parainfluenza Virus 3 NOT DETECTED NOT DETECTED Final   Parainfluenza Virus 4 NOT DETECTED NOT DETECTED Final   Respiratory Syncytial Virus NOT DETECTED NOT DETECTED Final   Bordetella pertussis NOT DETECTED NOT DETECTED Final   Bordetella Parapertussis NOT DETECTED NOT DETECTED Final   Chlamydophila  pneumoniae NOT DETECTED NOT DETECTED Final   Mycoplasma pneumoniae NOT DETECTED NOT DETECTED Final    Comment: Performed at [facility], [address], [place], [place] [address]      Studies: No results found.  Scheduled Meds:  gabapentin   900 mg Oral TID   insulin  aspart  0-15 Units Subcutaneous TID WC   losartan  25 mg Oral QPM   pantoprazole   40 mg Oral Daily    Continuous Infusions:  sodium chloride  100 mL/hr at 11/13/23 0626   ampicillin-sulbactam (UNASYN) IV 3 g (11/13/23 1243)   vancomycin 1,250 mg (11/12/23 2148)     LOS: 1 day     Lebron Gray Cage, MD [facility]  If 7PM-7AM, please contact night-coverage [email or web address] 11/13/2023, 1:48 PM

## 2023-11-13 NOTE — Plan of Care (Signed)
  Problem: Clinical Measurements: Goal: Ability to maintain clinical measurements within normal limits will improve Outcome: Progressing   Problem: Activity: Goal: Risk for activity intolerance will decrease Outcome: Progressing   Problem: Coping: Goal: Level of anxiety will decrease Outcome: Progressing   Problem: Pain Managment: Goal: General experience of comfort will improve and/or be controlled Outcome: Progressing   Problem: Fluid Volume: Goal: Ability to maintain a balanced intake and output will improve Outcome: Progressing   Problem: Tissue Perfusion: Goal: Adequacy of tissue perfusion will improve Outcome: Progressing

## 2023-11-13 NOTE — Progress Notes (Signed)
 ID PROGRESS NOTE  Continue with vancomycin and unasyn  Afebrile this morning. Blood cx NGTD at 36hr   A/P: Continue to follow cultures  I have reviewed imaging that doesn't necessarily point to any specific nidus of infection Continue on vancomycin and unasyn for 24hrs to see how to de-escalate Follow fever curve  Montie B. Luiz MD MPH [facility] [phone]

## 2023-11-14 DIAGNOSIS — A419 Sepsis, unspecified organism: Secondary | ICD-10-CM | POA: Diagnosis not present

## 2023-11-14 LAB — CBC WITH DIFFERENTIAL/PLATELET
Abs Immature Granulocytes: 0.02 K/uL (ref 0.00–0.07)
Basophils Absolute: 0 K/uL (ref 0.0–0.1)
Basophils Relative: 1 %
Eosinophils Absolute: 0.2 K/uL (ref 0.0–0.5)
Eosinophils Relative: 3 %
HCT: 35.8 % — ABNORMAL LOW (ref 39.0–52.0)
Hemoglobin: 12.7 g/dL — ABNORMAL LOW (ref 13.0–17.0)
Immature Granulocytes: 0 %
Lymphocytes Relative: 24 %
Lymphs Abs: 1.5 K/uL (ref 0.7–4.0)
MCH: 33.2 pg (ref 26.0–34.0)
MCHC: 35.5 g/dL (ref 30.0–36.0)
MCV: 93.7 fL (ref 80.0–100.0)
Monocytes Absolute: 0.6 K/uL (ref 0.1–1.0)
Monocytes Relative: 10 %
Neutro Abs: 3.9 K/uL (ref 1.7–7.7)
Neutrophils Relative %: 62 %
Platelets: 74 K/uL — ABNORMAL LOW (ref 150–400)
RBC: 3.82 MIL/uL — ABNORMAL LOW (ref 4.22–5.81)
RDW: 13.9 % (ref 11.5–15.5)
WBC: 6.2 K/uL (ref 4.0–10.5)
nRBC: 0 % (ref 0.0–0.2)

## 2023-11-14 LAB — GLUCOSE, CAPILLARY
Glucose-Capillary: 98 mg/dL (ref 70–99)
Glucose-Capillary: 132 mg/dL — ABNORMAL HIGH (ref 70–99)
Glucose-Capillary: 83 mg/dL (ref 70–99)
Glucose-Capillary: 131 mg/dL — ABNORMAL HIGH (ref 70–99)

## 2023-11-14 LAB — COMPREHENSIVE METABOLIC PANEL WITH GFR
ALT: 29 U/L (ref 0–44)
AST: 31 U/L (ref 15–41)
Albumin: 2.7 g/dL — ABNORMAL LOW (ref 3.5–5.0)
Alkaline Phosphatase: 55 U/L (ref 38–126)
Anion gap: 11 (ref 5–15)
BUN: 8 mg/dL (ref 6–20)
CO2: 24 mmol/L (ref 22–32)
Calcium: 8.6 mg/dL — ABNORMAL LOW (ref 8.9–10.3)
Chloride: 105 mmol/L (ref 98–111)
Creatinine, Ser: 0.8 mg/dL (ref 0.61–1.24)
GFR, Estimated: >60 mL/min (ref >60)
Glucose, Bld: 101 mg/dL — ABNORMAL HIGH (ref 70–99)
Potassium: 3.7 mmol/L (ref 3.5–5.1)
Sodium: 140 mmol/L (ref 135–145)
Total Bilirubin: 0.8 mg/dL (ref 0.0–1.2)
Total Protein: 5.8 g/dL — ABNORMAL LOW (ref 6.5–8.1)

## 2023-11-14 MED ORDER — ACETAMINOPHEN 500 MG PO TABS
1000.0000 mg | ORAL_TABLET | Freq: Three times a day (TID) | ORAL | Status: DC | PRN
  Administered 2023-11-14 – 2023-11-15 (×4): 1000 mg via ORAL
  Filled 2023-11-14 (×4): qty 2

## 2023-11-14 NOTE — Progress Notes (Signed)
 PROGRESS NOTE  Hunter Gray Vania Mickey. FMW:[id number] DOB: 1971-01-02 DOA: 11/11/2023 PCP: Allen Lauraine CROME, PA-C   HPI/Recap of past 24 hours: Hunter Gray Hunter Gray. is a 53 y.o. male with medical history significant of cirrhosis of liver (s/p recent esophageal banding on 11/09/23, follows w/ Dr. Alejandra), and DM2 p/w fever and hypotension c/f sepsis.  Patient with a possible dental caries, saw his dentist on 11/10/2023 for left lower molar infection, was given amoxicillin and plan for ED tooth extraction.  Patient complained of fever, chills, and some congestion with tooth pain. In [facility] ED, pt febrile, tachycardic ,and hypotensive. Labs notable for lactic acid 2.4. CT chest and CT soft neck w/o acute findings. CXR w/o acute process.  Patient admitted for further management.    Today, patient still complaining of headache.  Discussed extensively with wife at bedside.    Assessment/Plan: Principal Problem:   Sepsis due to undetermined organism [facility])   Severe sepsis of unknown etiology Meets sepsis criteria with fever, tachycardia, lactic acidosis  Last temp spike on 11/7, with no leukocytosis BC x 2 NGTD Procalcitonin 0.38 Lactic acidosis 2.4-->1.4 UA unremarkable COVID, flu, RSV as well as respiratory viral panel all negative Chest x-ray negative CT soft tissue neck with no noted dental abscess (discussed with Dr. Perley radiologist on 11/8) CT chest with no acute intrathoracic process, noted cirrhosis with portal venous hypertension with splenomegaly and upper abdominal varices, noted some mild gallbladder wall thickening Right upper quadrant ultrasound showed slight increased gallbladder wall thickness without visible stones, nonspecific findings could be related to liver disease.  If further imaging required, consider HIDA scan May consider HIDA scan if significant abdominal pain or worsening clinical status Infectious disease on board, appreciate recs Continue IV Unasyn,  vancomycin Telemetry Monitor closely  Hypertension BP stable for now Hold losartan for now  Diabetes mellitus type 2 Last A1c 4.7 Continue SSI, Accu-Cheks, hypoglycemic protocol  Liver cirrhosis with esophageal varices Chronic thrombocytopenia Appears compensated S/p variceal banding on 11/09/2023 Continue pantoprazole  Monitor closely  Obesity class I Lifestyle modification advised    Estimated body mass index is 32.01 kg/m as calculated from the following:   Height as of this encounter: 5' 2 (1.575 m).   Weight as of this encounter: 79.4 kg.     Code Status: Full  Family Communication: Wife at bedside  Disposition Plan: Status is: Inpatient Remains inpatient appropriate because: Level of care      Consultants: Infectious disease  Procedures: None  Antimicrobials: Unasyn, vancomycin  DVT prophylaxis: SCDs   Objective: Vitals:   11/13/23 1703 11/13/23 2045 11/14/23 0514 11/14/23 0950  BP: 130/64 (!) 113/58 108/67 119/71  Pulse: 66 72 64 60  Resp: 16 18 18 16   Temp: 98.1 F (36.7 C) 99.8 F (37.7 C) 97.8 F (36.6 C) 98.5 F (36.9 C)  TempSrc: Oral  Oral Oral  SpO2: 97%  95% 98%  Weight:      Height:        Intake/Output Summary (Last 24 hours) at 11/14/2023 1458 Last data filed at 11/14/2023 1219 Gross per 24 hour  Intake 1910 ml  Output --  Net 1910 ml   Filed Weights   11/11/23 1737  Weight: 79.4 kg    Exam: General: NAD  Cardiovascular: S1, S2 present Respiratory: CTAB Abdomen: Soft, nontender, nondistended, bowel sounds present Musculoskeletal: No bilateral pedal edema noted Skin: Normal Psychiatry: Normal mood    Data Reviewed: CBC: Recent Labs  Lab 11/11/23 1758 11/13/23 0824 11/14/23  0459  WBC 5.8 5.1 6.2  NEUTROABS 5.1 3.4 3.9  HGB 14.6 13.1 12.7*  HCT 40.7 37.0* 35.8*  MCV 93.3 94.4 93.7  PLT 77* 64* 74*   Basic Metabolic Panel: Recent Labs  Lab 11/11/23 1758 11/13/23 0824 11/14/23 0459  NA 136 139  140  K 3.7 3.6 3.7  CL 100 107 105  CO2 23 24 24   GLUCOSE 142* 127* 101*  BUN 9 10 8   CREATININE 0.95 0.95 0.80  CALCIUM 9.5 8.4* 8.6*   GFR: Estimated Creatinine Clearance: 97.4 mL/min (by C-G formula based on SCr of 0.8 mg/dL). Liver Function Tests: Recent Labs  Lab 11/11/23 1758 11/13/23 0824 11/14/23 0459  AST 44* 37 31  ALT 47* 36 29  ALKPHOS 82 49 55  BILITOT 1.0 0.7 0.8  PROT 7.4 5.8* 5.8*  ALBUMIN 4.2 2.7* 2.7*   No results for input(s): LIPASE, AMYLASE in the last 168 hours. No results for input(s): AMMONIA in the last 168 hours. Coagulation Profile: Recent Labs  Lab 11/11/23 1758  INR 1.1   Cardiac Enzymes: No results for input(s): CKTOTAL, CKMB, CKMBINDEX, TROPONINI in the last 168 hours. BNP (last 3 results) No results for input(s): PROBNP in the last 8760 hours. HbA1C: Recent Labs    11/13/23 0534  HGBA1C 4.7*   CBG: Recent Labs  Lab 11/13/23 1154 11/13/23 1659 11/13/23 2043 11/14/23 0752 11/14/23 1202  GLUCAP 111* 103* 117* 98 132*   Lipid Profile: No results for input(s): CHOL, HDL, LDLCALC, TRIG, CHOLHDL, LDLDIRECT in the last 72 hours. Thyroid Function Tests: No results for input(s): TSH, T4TOTAL, FREET4, T3FREE, THYROIDAB in the last 72 hours. Anemia Panel: No results for input(s): VITAMINB12, FOLATE, FERRITIN, TIBC, IRON, RETICCTPCT in the last 72 hours. Urine analysis:    Component Value Date/Time   COLORURINE YELLOW 11/11/2023 1932   APPEARANCEUR CLEAR 11/11/2023 1932   LABSPEC 1.010 11/11/2023 1932   PHURINE 6.0 11/11/2023 1932   GLUCOSEU >1,000 (A) 11/11/2023 1932   HGBUR NEGATIVE 11/11/2023 1932   BILIRUBINUR NEGATIVE 11/11/2023 1932   KETONESUR NEGATIVE 11/11/2023 1932   PROTEINUR NEGATIVE 11/11/2023 1932   NITRITE NEGATIVE 11/11/2023 1932   LEUKOCYTESUR NEGATIVE 11/11/2023 1932   Sepsis Labs: @LABRCNTIP (procalcitonin:4,lacticidven:4)  ) Recent Results (from the past  240 hours)  Culture, blood (Routine x 2)     Status: None (Preliminary result)   Collection Time: 11/11/23  5:42 PM   Specimen: BLOOD  Result Value Ref Range Status   Specimen Description   Final    BLOOD LEFT ANTECUBITAL Performed at [facility], [address], [place], [place] [address]    Special Requests   Final    BOTTLES DRAWN AEROBIC AND ANAEROBIC Blood Culture adequate volume Performed at [facility], [address] [place], [place] [address]    Culture   Final    NO GROWTH 3 DAYS Performed at [facility], [address], [place], [place] [address]    Report Status PENDING  Incomplete  Culture, blood (Routine x 2)     Status: None (Preliminary result)   Collection Time: 11/11/23  6:35 PM   Specimen: BLOOD  Result Value Ref Range Status   Specimen Description   Final    BLOOD RIGHT ANTECUBITAL Performed at [facility], [address] [place], [place] [address]    Special Requests   Final    BOTTLES DRAWN AEROBIC AND ANAEROBIC Blood Culture adequate volume Performed at [facility], [address],  [place], [place] [address]    Culture   Final    NO GROWTH 3 DAYS Performed at [facility], [address] [place], [place] [address]    Report Status PENDING  Incomplete  Resp panel by RT-PCR (RSV, Flu A&B, Covid) Anterior Nasal Swab     Status: None   Collection Time: 11/11/23  7:06 PM   Specimen: Anterior Nasal Swab  Result Value Ref Range Status   SARS Coronavirus 2 by RT PCR NEGATIVE NEGATIVE Final    Comment: (NOTE) SARS-CoV-2 target nucleic acids are NOT DETECTED.  The SARS-CoV-2 RNA is generally detectable in upper respiratory specimens during the acute phase of infection. The lowest concentration of SARS-CoV-2 viral copies this assay can detect is 138 copies/mL. A negative result does not preclude SARS-Cov-2 infection and should not be used as the sole basis for  treatment or other patient management decisions. A negative result may occur with  improper specimen collection/handling, submission of specimen other than nasopharyngeal swab, presence of viral mutation(s) within the areas targeted by this assay, and inadequate number of viral copies(<138 copies/mL). A negative result must be combined with clinical observations, patient history, and epidemiological information. The expected result is Negative.  Fact Sheet for Patients:  bloggercourse.com  Fact Sheet for Healthcare Providers:  seriousbroker.it  This test is no t yet approved or cleared by the [place]  FDA and  has been authorized for detection and/or diagnosis of SARS-CoV-2 by FDA under an Emergency Use Authorization (EUA). This EUA will remain  in effect (meaning this test can be used) for the duration of the COVID-19 declaration under Section 564(b)(1) of the Act, 21 U.S.C.section 360bbb-3(b)(1), unless the authorization is terminated  or revoked sooner.       Influenza A by PCR NEGATIVE NEGATIVE Final   Influenza B by PCR NEGATIVE NEGATIVE Final    Comment: (NOTE) The Xpert Xpress SARS-CoV-2/FLU/RSV plus assay is intended as an aid in the diagnosis of influenza from Nasopharyngeal swab specimens and should not be used as a sole basis for treatment. Nasal washings and aspirates are unacceptable for Xpert Xpress SARS-CoV-2/FLU/RSV testing.  Fact Sheet for Patients: bloggercourse.com  Fact Sheet for Healthcare Providers: seriousbroker.it  This test is not yet approved or cleared by the [place]  FDA and has been authorized for detection and/or diagnosis of SARS-CoV-2 by FDA under an Emergency Use Authorization (EUA). This EUA will remain in effect (meaning this test can be used) for the duration of the COVID-19 declaration under Section 564(b)(1) of the Act, 21  U.S.C. section 360bbb-3(b)(1), unless the authorization is terminated or revoked.     Resp Syncytial Virus by PCR NEGATIVE NEGATIVE Final    Comment: (NOTE) Fact Sheet for Patients: bloggercourse.com  Fact Sheet for Healthcare Providers: seriousbroker.it  This test is not yet approved or cleared by the [place]  FDA and has been authorized for detection and/or diagnosis of SARS-CoV-2 by FDA under an Emergency Use Authorization (EUA). This EUA will remain in effect (meaning this test can be used) for the duration of the COVID-19 declaration under Section 564(b)(1) of the Act, 21 U.S.C. section 360bbb-3(b)(1), unless the authorization is terminated or revoked.  Performed at [facility], [address], [place], [place] [address]   Respiratory (~20 pathogens) panel by PCR     Status: None   Collection Time: 11/13/23  7:04 AM   Specimen: Nasopharyngeal Swab; Respiratory  Result Value Ref Range Status   Adenovirus NOT DETECTED NOT DETECTED Final  Coronavirus 229E NOT DETECTED NOT DETECTED Final    Comment: (NOTE) The Coronavirus on the Respiratory Panel, DOES NOT test for the novel  Coronavirus (2019 nCoV)    Coronavirus HKU1 NOT DETECTED NOT DETECTED Final   Coronavirus NL63 NOT DETECTED NOT DETECTED Final   Coronavirus OC43 NOT DETECTED NOT DETECTED Final   Metapneumovirus NOT DETECTED NOT DETECTED Final   Rhinovirus / Enterovirus NOT DETECTED NOT DETECTED Final   Influenza A NOT DETECTED NOT DETECTED Final   Influenza B NOT DETECTED NOT DETECTED Final   Parainfluenza Virus 1 NOT DETECTED NOT DETECTED Final   Parainfluenza Virus 2 NOT DETECTED NOT DETECTED Final   Parainfluenza Virus 3 NOT DETECTED NOT DETECTED Final   Parainfluenza Virus 4 NOT DETECTED NOT DETECTED Final   Respiratory Syncytial Virus NOT DETECTED NOT DETECTED Final   Bordetella pertussis NOT DETECTED NOT DETECTED Final   Bordetella  Parapertussis NOT DETECTED NOT DETECTED Final   Chlamydophila pneumoniae NOT DETECTED NOT DETECTED Final   Mycoplasma pneumoniae NOT DETECTED NOT DETECTED Final    Comment: Performed at [facility], [address], [place], [place] [address]      Studies: No results found.  Scheduled Meds:  gabapentin   900 mg Oral TID   insulin  aspart  0-15 Units Subcutaneous TID WC   pantoprazole   40 mg Oral Daily    Continuous Infusions:  ampicillin-sulbactam (UNASYN) IV 3 g (11/14/23 1330)   vancomycin 1,250 mg (11/13/23 2039)     LOS: 2 days     Lebron Gray Cage, MD [facility]  If 7PM-7AM, please contact night-coverage [email or web address] 11/14/2023, 2:58 PM

## 2023-11-14 NOTE — Plan of Care (Signed)

## 2023-11-14 NOTE — Progress Notes (Signed)
 ID PROGRESS NOTE  No further infection found. Will d/c antibiotics. Monitor off of abtx. Suspect his fevers may have been from banding procedure. No bacteremia noted. Cx NGTD x 3 days. Will sign off.  Montie FURY Luiz MD MPH [facility] [phone]

## 2023-11-14 NOTE — Plan of Care (Signed)
  Problem: Clinical Measurements: Goal: Ability to maintain clinical measurements within normal limits will improve Outcome: Progressing   Problem: Activity: Goal: Risk for activity intolerance will decrease Outcome: Progressing   Problem: Elimination: Goal: Will not experience complications related to bowel motility Outcome: Progressing   Problem: Tissue Perfusion: Goal: Adequacy of tissue perfusion will improve Outcome: Progressing

## 2023-11-15 DIAGNOSIS — A419 Sepsis, unspecified organism: Secondary | ICD-10-CM | POA: Diagnosis not present

## 2023-11-15 LAB — CBC WITH DIFFERENTIAL/PLATELET
Abs Immature Granulocytes: 0.01 K/uL (ref 0.00–0.07)
Basophils Absolute: 0 K/uL (ref 0.0–0.1)
Basophils Relative: 0 %
Eosinophils Absolute: 0.3 K/uL (ref 0.0–0.5)
Eosinophils Relative: 6 %
HCT: 37.4 % — ABNORMAL LOW (ref 39.0–52.0)
Hemoglobin: 13.3 g/dL (ref 13.0–17.0)
Immature Granulocytes: 0 %
Lymphocytes Relative: 31 %
Lymphs Abs: 1.4 K/uL (ref 0.7–4.0)
MCH: 33.2 pg (ref 26.0–34.0)
MCHC: 35.6 g/dL (ref 30.0–36.0)
MCV: 93.3 fL (ref 80.0–100.0)
Monocytes Absolute: 0.5 K/uL (ref 0.1–1.0)
Monocytes Relative: 10 %
Neutro Abs: 2.3 K/uL (ref 1.7–7.7)
Neutrophils Relative %: 53 %
Platelets: 84 K/uL — ABNORMAL LOW (ref 150–400)
RBC: 4.01 MIL/uL — ABNORMAL LOW (ref 4.22–5.81)
RDW: 13.8 % (ref 11.5–15.5)
WBC: 4.5 K/uL (ref 4.0–10.5)
nRBC: 0 % (ref 0.0–0.2)

## 2023-11-15 LAB — COMPREHENSIVE METABOLIC PANEL WITH GFR
ALT: 31 U/L (ref 0–44)
AST: 32 U/L (ref 15–41)
Albumin: 2.9 g/dL — ABNORMAL LOW (ref 3.5–5.0)
Alkaline Phosphatase: 53 U/L (ref 38–126)
Anion gap: 12 (ref 5–15)
BUN: 8 mg/dL (ref 6–20)
CO2: 24 mmol/L (ref 22–32)
Calcium: 8.6 mg/dL — ABNORMAL LOW (ref 8.9–10.3)
Chloride: 104 mmol/L (ref 98–111)
Creatinine, Ser: 0.77 mg/dL (ref 0.61–1.24)
GFR, Estimated: >60 mL/min (ref >60)
Glucose, Bld: 82 mg/dL (ref 70–99)
Potassium: 3.4 mmol/L — ABNORMAL LOW (ref 3.5–5.1)
Sodium: 140 mmol/L (ref 135–145)
Total Bilirubin: 1 mg/dL (ref 0.0–1.2)
Total Protein: 6.2 g/dL — ABNORMAL LOW (ref 6.5–8.1)

## 2023-11-15 LAB — GLUCOSE, CAPILLARY
Glucose-Capillary: 86 mg/dL (ref 70–99)
Glucose-Capillary: 96 mg/dL (ref 70–99)
Glucose-Capillary: 132 mg/dL — ABNORMAL HIGH (ref 70–99)
Glucose-Capillary: 125 mg/dL — ABNORMAL HIGH (ref 70–99)

## 2023-11-15 LAB — MAGNESIUM: Magnesium: 1.9 mg/dL (ref 1.7–2.4)

## 2023-11-15 MED ORDER — POTASSIUM CHLORIDE CRYS ER 20 MEQ PO TBCR
40.0000 meq | EXTENDED_RELEASE_TABLET | Freq: Two times a day (BID) | ORAL | Status: AC
  Administered 2023-11-15 – 2023-11-16 (×2): 40 meq via ORAL
  Filled 2023-11-15 (×2): qty 2

## 2023-11-15 NOTE — Plan of Care (Signed)

## 2023-11-15 NOTE — Progress Notes (Addendum)
 PROGRESS NOTE  Hunter JINNY Vania Mickey. FMW:[id number] DOB: 1970-12-04 DOA: 11/11/2023 PCP: Allen Lauraine CROME, PA-C   HPI/Recap of past 24 hours: Hunter Gray. is a 53 y.o. male with medical history significant of cirrhosis of liver (s/p recent esophageal banding on 11/09/23, follows w/ Dr. Alejandra), and DM2 p/w fever and hypotension c/f sepsis.  Patient with a possible dental caries, saw his dentist on 11/10/2023 for left lower molar infection, was given amoxicillin and plan for ED tooth extraction.  Patient complained of fever, chills, and some congestion with tooth pain. In [facility] ED, pt febrile, tachycardic ,and hypotensive. Labs notable for lactic acid 2.4. CT chest and CT soft neck w/o acute findings. CXR w/o acute process.  Patient admitted for further management.    Today, patient reports feeling better overall.  Has remained fever free, antibiotics discontinued on 11/9.  Watch for another day of antibiotics.    Assessment/Plan: Principal Problem:   Sepsis due to undetermined organism [facility])   Severe sepsis of unknown etiology Meets sepsis criteria with fever, tachycardia, lactic acidosis  Last temp spike on 11/7, with no leukocytosis BC x 2 NGTD Procalcitonin 0.38 Lactic acidosis 2.4-->1.4 UA unremarkable COVID, flu, RSV as well as respiratory viral panel all negative Chest x-ray negative CT soft tissue neck with no noted dental abscess (discussed with Dr. Perley radiologist on 11/8) CT chest with no acute intrathoracic process, noted cirrhosis with portal venous hypertension with splenomegaly and upper abdominal varices, noted some mild gallbladder wall thickening Right upper quadrant ultrasound showed slight increased gallbladder wall thickness without visible stones, nonspecific findings could be related to liver disease.  If further imaging required, consider HIDA scan May consider HIDA scan if significant abdominal pain or worsening clinical status Infectious disease  consulted and signed off, recommend monitoring patient off antibiotics S/p IV Unasyn, vancomycin DC'd on 11/14/23 Telemetry Monitor closely  Hypertension BP stable for now Hold losartan for now  Hypokalemia Replace as needed  Diabetes mellitus type 2 Last A1c 4.7 Continue SSI, Accu-Cheks, hypoglycemic protocol  Liver cirrhosis with esophageal varices Chronic thrombocytopenia Appears compensated S/p variceal banding on 11/09/2023 Continue pantoprazole  Monitor closely  Obesity class I Lifestyle modification advised    Estimated body mass index is 32.01 kg/m as calculated from the following:   Height as of this encounter: 5' 2 (1.575 m).   Weight as of this encounter: 79.4 kg.     Code Status: Full  Family Communication: Daughter at bedside  Disposition Plan: Status is: Inpatient Remains inpatient appropriate because: Level of care      Consultants: Infectious disease  Procedures: None  Antimicrobials: None  DVT prophylaxis: SCDs   Objective: Vitals:   11/14/23 2040 11/14/23 2253 11/15/23 0612 11/15/23 1158  BP: (!) 112/59 107/61 (!) 107/57 108/60  Pulse: (!) 56 (!) 57 (!) 47 (!) 56  Resp: 16 18 17 18   Temp: 98.4 F (36.9 C) 98.7 F (37.1 C) 98.2 F (36.8 C) 98 F (36.7 C)  TempSrc: Oral Oral Oral Oral  SpO2: 97% 98% 95% 97%  Weight:      Height:       No intake or output data in the 24 hours ending 11/15/23 1521  Filed Weights   11/11/23 1737  Weight: 79.4 kg    Exam: General: NAD  Cardiovascular: S1, S2 present Respiratory: CTAB Abdomen: Soft, nontender, nondistended, bowel sounds present Musculoskeletal: No bilateral pedal edema noted Skin: Normal Psychiatry: Normal mood    Data Reviewed: CBC: Recent Labs  Lab 11/11/23 1758 11/13/23 0824 11/14/23 0459 11/15/23 0539  WBC 5.8 5.1 6.2 4.5  NEUTROABS 5.1 3.4 3.9 2.3  HGB 14.6 13.1 12.7* 13.3  HCT 40.7 37.0* 35.8* 37.4*  MCV 93.3 94.4 93.7 93.3  PLT 77* 64* 74* 84*    Basic Metabolic Panel: Recent Labs  Lab 11/11/23 1758 11/13/23 0824 11/14/23 0459 11/15/23 0539  NA 136 139 140 140  K 3.7 3.6 3.7 3.4*  CL 100 107 105 104  CO2 23 24 24 24   GLUCOSE 142* 127* 101* 82  BUN 9 10 8 8   CREATININE 0.95 0.95 0.80 0.77  CALCIUM 9.5 8.4* 8.6* 8.6*   GFR: Estimated Creatinine Clearance: 97.4 mL/min (by C-G formula based on SCr of 0.77 mg/dL). Liver Function Tests: Recent Labs  Lab 11/11/23 1758 11/13/23 0824 11/14/23 0459 11/15/23 0539  AST 44* 37 31 32  ALT 47* 36 29 31  ALKPHOS 82 49 55 53  BILITOT 1.0 0.7 0.8 1.0  PROT 7.4 5.8* 5.8* 6.2*  ALBUMIN 4.2 2.7* 2.7* 2.9*   No results for input(s): LIPASE, AMYLASE in the last 168 hours. No results for input(s): AMMONIA in the last 168 hours. Coagulation Profile: Recent Labs  Lab 11/11/23 1758  INR 1.1   Cardiac Enzymes: No results for input(s): CKTOTAL, CKMB, CKMBINDEX, TROPONINI in the last 168 hours. BNP (last 3 results) No results for input(s): PROBNP in the last 8760 hours. HbA1C: Recent Labs    11/13/23 0534  HGBA1C 4.7*   CBG: Recent Labs  Lab 11/14/23 1202 11/14/23 1706 11/14/23 2043 11/15/23 0809 11/15/23 1209  GLUCAP 132* 83 131* 86 96   Lipid Profile: No results for input(s): CHOL, HDL, LDLCALC, TRIG, CHOLHDL, LDLDIRECT in the last 72 hours. Thyroid Function Tests: No results for input(s): TSH, T4TOTAL, FREET4, T3FREE, THYROIDAB in the last 72 hours. Anemia Panel: No results for input(s): VITAMINB12, FOLATE, FERRITIN, TIBC, IRON, RETICCTPCT in the last 72 hours. Urine analysis:    Component Value Date/Time   COLORURINE YELLOW 11/11/2023 1932   APPEARANCEUR CLEAR 11/11/2023 1932   LABSPEC 1.010 11/11/2023 1932   PHURINE 6.0 11/11/2023 1932   GLUCOSEU >1,000 (A) 11/11/2023 1932   HGBUR NEGATIVE 11/11/2023 1932   BILIRUBINUR NEGATIVE 11/11/2023 1932   KETONESUR NEGATIVE 11/11/2023 1932   PROTEINUR NEGATIVE  11/11/2023 1932   NITRITE NEGATIVE 11/11/2023 1932   LEUKOCYTESUR NEGATIVE 11/11/2023 1932   Sepsis Labs: @LABRCNTIP (procalcitonin:4,lacticidven:4)  ) Recent Results (from the past 240 hours)  Culture, blood (Routine x 2)     Status: None (Preliminary result)   Collection Time: 11/11/23  5:42 PM   Specimen: BLOOD  Result Value Ref Range Status   Specimen Description   Final    BLOOD LEFT ANTECUBITAL Performed at [facility], [address] [place], [place] [address]    Special Requests   Final    BOTTLES DRAWN AEROBIC AND ANAEROBIC Blood Culture adequate volume Performed at [facility], [address] [place], [place] [address]    Culture   Final    NO GROWTH 4 DAYS Performed at [facility], [address], [place], [place] [address]    Report Status PENDING  Incomplete  Culture, blood (Routine x 2)     Status: None (Preliminary result)   Collection Time: 11/11/23  6:35 PM   Specimen: BLOOD  Result Value Ref Range Status   Specimen Description   Final    BLOOD RIGHT ANTECUBITAL Performed at [facility], [address]  [place], [place] [address]    Special Requests   Final    BOTTLES DRAWN AEROBIC AND ANAEROBIC Blood Culture adequate volume Performed at [facility], [address], [place], [place] [address]    Culture   Final    NO GROWTH 4 DAYS Performed at [facility], [address], [place], [place] [address]    Report Status PENDING  Incomplete  Resp panel by RT-PCR (RSV, Flu A&B, Covid) Anterior Nasal Swab     Status: None   Collection Time: 11/11/23  7:06 PM   Specimen: Anterior Nasal Swab  Result Value Ref Range Status   SARS Coronavirus 2 by RT PCR NEGATIVE NEGATIVE Final    Comment: (NOTE) SARS-CoV-2 target nucleic acids are NOT DETECTED.  The SARS-CoV-2 RNA is generally detectable in upper respiratory specimens during the acute phase of infection. The  lowest concentration of SARS-CoV-2 viral copies this assay can detect is 138 copies/mL. A negative result does not preclude SARS-Cov-2 infection and should not be used as the sole basis for treatment or other patient management decisions. A negative result may occur with  improper specimen collection/handling, submission of specimen other than nasopharyngeal swab, presence of viral mutation(s) within the areas targeted by this assay, and inadequate number of viral copies(<138 copies/mL). A negative result must be combined with clinical observations, patient history, and epidemiological information. The expected result is Negative.  Fact Sheet for Patients:  bloggercourse.com  Fact Sheet for Healthcare Providers:  seriousbroker.it  This test is no t yet approved or cleared by the [place]  FDA and  has been authorized for detection and/or diagnosis of SARS-CoV-2 by FDA under an Emergency Use Authorization (EUA). This EUA will remain  in effect (meaning this test can be used) for the duration of the COVID-19 declaration under Section 564(b)(1) of the Act, 21 U.S.C.section 360bbb-3(b)(1), unless the authorization is terminated  or revoked sooner.       Influenza A by PCR NEGATIVE NEGATIVE Final   Influenza B by PCR NEGATIVE NEGATIVE Final    Comment: (NOTE) The Xpert Xpress SARS-CoV-2/FLU/RSV plus assay is intended as an aid in the diagnosis of influenza from Nasopharyngeal swab specimens and should not be used as a sole basis for treatment. Nasal washings and aspirates are unacceptable for Xpert Xpress SARS-CoV-2/FLU/RSV testing.  Fact Sheet for Patients: bloggercourse.com  Fact Sheet for Healthcare Providers: seriousbroker.it  This test is not yet approved or cleared by the [place]  FDA and has been authorized for detection and/or diagnosis of SARS-CoV-2 by FDA under  an Emergency Use Authorization (EUA). This EUA will remain in effect (meaning this test can be used) for the duration of the COVID-19 declaration under Section 564(b)(1) of the Act, 21 U.S.C. section 360bbb-3(b)(1), unless the authorization is terminated or revoked.     Resp Syncytial Virus by PCR NEGATIVE NEGATIVE Final    Comment: (NOTE) Fact Sheet for Patients: bloggercourse.com  Fact Sheet for Healthcare Providers: seriousbroker.it  This test is not yet approved or cleared by the [place]  FDA and has been authorized for detection and/or diagnosis of SARS-CoV-2 by FDA under an Emergency Use Authorization (EUA). This EUA will remain in effect (meaning this test can be used) for the duration of the COVID-19 declaration under Section 564(b)(1) of the Act, 21 U.S.C. section 360bbb-3(b)(1), unless the authorization is terminated or revoked.  Performed at [facility], [address] [place], [place] [address]   Respiratory (~20 pathogens) panel by PCR  Status: None   Collection Time: 11/13/23  7:04 AM   Specimen: Nasopharyngeal Swab; Respiratory  Result Value Ref Range Status   Adenovirus NOT DETECTED NOT DETECTED Final   Coronavirus 229E NOT DETECTED NOT DETECTED Final    Comment: (NOTE) The Coronavirus on the Respiratory Panel, DOES NOT test for the novel  Coronavirus (2019 nCoV)    Coronavirus HKU1 NOT DETECTED NOT DETECTED Final   Coronavirus NL63 NOT DETECTED NOT DETECTED Final   Coronavirus OC43 NOT DETECTED NOT DETECTED Final   Metapneumovirus NOT DETECTED NOT DETECTED Final   Rhinovirus / Enterovirus NOT DETECTED NOT DETECTED Final   Influenza A NOT DETECTED NOT DETECTED Final   Influenza B NOT DETECTED NOT DETECTED Final   Parainfluenza Virus 1 NOT DETECTED NOT DETECTED Final   Parainfluenza Virus 2 NOT DETECTED NOT DETECTED Final   Parainfluenza Virus 3 NOT DETECTED NOT DETECTED Final    Parainfluenza Virus 4 NOT DETECTED NOT DETECTED Final   Respiratory Syncytial Virus NOT DETECTED NOT DETECTED Final   Bordetella pertussis NOT DETECTED NOT DETECTED Final   Bordetella Parapertussis NOT DETECTED NOT DETECTED Final   Chlamydophila pneumoniae NOT DETECTED NOT DETECTED Final   Mycoplasma pneumoniae NOT DETECTED NOT DETECTED Final    Comment: Performed at [facility], [address], [place], [place] [address]      Studies: No results found.  Scheduled Meds:  gabapentin   900 mg Oral TID   insulin  aspart  0-15 Units Subcutaneous TID WC   pantoprazole   40 mg Oral Daily    Continuous Infusions:     LOS: 3 days     Hunter JINNY Cage, MD [facility]  If 7PM-7AM, please contact night-coverage [email or web address] 11/15/2023, 3:21 PM

## 2023-11-15 NOTE — Plan of Care (Signed)
  Problem: Education: Goal: Knowledge of General Education information will improve Description: Including pain rating scale, medication(s)/side effects and non-pharmacologic comfort measures Outcome: Progressing   Problem: Clinical Measurements: Goal: Will remain free from infection Outcome: Progressing   Problem: Pain Managment: Goal: General experience of comfort will improve and/or be controlled Outcome: Progressing   Problem: Safety: Goal: Ability to remain free from injury will improve Outcome: Progressing

## 2023-11-16 DIAGNOSIS — A419 Sepsis, unspecified organism: Secondary | ICD-10-CM | POA: Diagnosis not present

## 2023-11-16 LAB — CULTURE, BLOOD (ROUTINE X 2)
Culture: NO GROWTH
Culture: NO GROWTH
Special Requests: ADEQUATE
Special Requests: ADEQUATE

## 2023-11-16 LAB — CBC WITH DIFFERENTIAL/PLATELET
Abs Immature Granulocytes: 0.01 K/uL (ref 0.00–0.07)
Basophils Absolute: 0 K/uL (ref 0.0–0.1)
Basophils Relative: 1 %
Eosinophils Absolute: 0.3 K/uL (ref 0.0–0.5)
Eosinophils Relative: 6 %
HCT: 37.9 % — ABNORMAL LOW (ref 39.0–52.0)
Hemoglobin: 13.2 g/dL (ref 13.0–17.0)
Immature Granulocytes: 0 %
Lymphocytes Relative: 31 %
Lymphs Abs: 1.3 K/uL (ref 0.7–4.0)
MCH: 33.2 pg (ref 26.0–34.0)
MCHC: 34.8 g/dL (ref 30.0–36.0)
MCV: 95.2 fL (ref 80.0–100.0)
Monocytes Absolute: 0.4 K/uL (ref 0.1–1.0)
Monocytes Relative: 10 %
Neutro Abs: 2.2 K/uL (ref 1.7–7.7)
Neutrophils Relative %: 52 %
Platelets: 93 K/uL — ABNORMAL LOW (ref 150–400)
RBC: 3.98 MIL/uL — ABNORMAL LOW (ref 4.22–5.81)
RDW: 13.8 % (ref 11.5–15.5)
WBC: 4.2 K/uL (ref 4.0–10.5)
nRBC: 0 % (ref 0.0–0.2)

## 2023-11-16 LAB — COMPREHENSIVE METABOLIC PANEL WITH GFR
ALT: 37 U/L (ref 0–44)
AST: 43 U/L — ABNORMAL HIGH (ref 15–41)
Albumin: 2.9 g/dL — ABNORMAL LOW (ref 3.5–5.0)
Alkaline Phosphatase: 70 U/L (ref 38–126)
Anion gap: 13 (ref 5–15)
BUN: 6 mg/dL (ref 6–20)
CO2: 23 mmol/L (ref 22–32)
Calcium: 9 mg/dL (ref 8.9–10.3)
Chloride: 107 mmol/L (ref 98–111)
Creatinine, Ser: 0.77 mg/dL (ref 0.61–1.24)
GFR, Estimated: >60 mL/min (ref >60)
Glucose, Bld: 98 mg/dL (ref 70–99)
Potassium: 4.4 mmol/L (ref 3.5–5.1)
Sodium: 143 mmol/L (ref 135–145)
Total Bilirubin: 0.6 mg/dL (ref 0.0–1.2)
Total Protein: 6.1 g/dL — ABNORMAL LOW (ref 6.5–8.1)

## 2023-11-16 LAB — GLUCOSE, CAPILLARY: Glucose-Capillary: 88 mg/dL (ref 70–99)

## 2023-11-16 MED ORDER — LOSARTAN POTASSIUM 25 MG PO TABS
12.5000 mg | ORAL_TABLET | Freq: Every evening | ORAL | Status: AC
Start: 2023-11-16 — End: ?

## 2023-11-16 MED ORDER — ACETAMINOPHEN 500 MG PO TABS: 500.0000 mg | ORAL_TABLET | Freq: Three times a day (TID) | ORAL | Status: AC | PRN

## 2023-11-16 NOTE — Discharge Summary (Signed)
 Physician Discharge Summary   Patient: Hunter Gray. MRN: [id number] DOB: 1970/01/18  Admit date:     11/11/2023  Discharge date: 11/16/23  Discharge Physician: Lebron JINNY Cage   PCP: Allen Lauraine CROME, PA-C   Recommendations at discharge:   Follow-up with PCP in 1 week Follow-up with dentist as scheduled  Discharge Diagnoses: Principal Problem:   Sepsis due to undetermined organism [facility])    Hospital Course: Hunter Gray. is a 53 y.o. male with medical history significant of cirrhosis of liver (s/p recent esophageal banding on 11/09/23, follows w/ Dr. Alejandra), and DM2 p/w fever and hypotension c/f sepsis.  Patient with a possible dental caries, saw his dentist on 11/10/2023 for left lower molar infection, was given amoxicillin and plan for ED tooth extraction.  Patient complained of fever, chills, and some congestion with tooth pain. In [facility] ED, pt febrile, tachycardic ,and hypotensive. Labs notable for lactic acid 2.4. CT chest and CT soft neck w/o acute findings. CXR w/o acute process.  Patient admitted for further management.   Today, patient reports feeling better overall, discussed discharge plan with his wife at bedside.  Has remained fever free off antibiotics.  Follow-up with PCP, dentist as scheduled.    Assessment and Plan: Severe sepsis of unknown etiology Met sepsis criteria with fever, tachycardia, lactic acidosis  Last temp spike on 11/7, with no leukocytosis BC x 2 NGTD Procalcitonin 0.38 Lactic acidosis 2.4-->1.4 UA unremarkable COVID, flu, RSV as well as respiratory viral panel all negative Chest x-ray negative CT soft tissue neck with no noted dental abscess (discussed with Dr. Perley radiologist on 11/8) CT chest with no acute intrathoracic process, noted cirrhosis with portal venous hypertension with splenomegaly and upper abdominal varices, noted some mild gallbladder wall thickening Right upper quadrant ultrasound showed slight increased  gallbladder wall thickness without visible stones, nonspecific findings could be related to liver disease.  If further imaging required, consider HIDA scan (not done as patient denies any N/V/abdominal pain and has remained fever free off of antibiotics) Infectious disease consulted and signed off S/p IV Unasyn, vancomycin DC'd on 11/14/23 Outpatient follow-up with PCP and dentist   Hypertension BP stable for now Decreased PTA losartan 12.5 mg daily as patient presented with hypotension (can increase back to 25 mg once BP has remained stable)   Hypokalemia Replaced as needed   Diabetes mellitus type 2 Last A1c 4.7 Continue home regimen   Liver cirrhosis with esophageal varices Chronic thrombocytopenia Appears compensated S/p variceal banding on 11/09/2023 Continue pantoprazole  Outpatient follow-up with GI   Obesity class I Lifestyle modification advised      Pain control - [place]  Controlled Substance Reporting System database was reviewed. and patient was instructed, not to drive, operate heavy machinery, perform activities at heights, swimming or participation in water activities or provide baby-sitting services while on Pain, Sleep and Anxiety Medications; until their outpatient Physician has advised to do so again. Also recommended to not to take more than prescribed Pain, Sleep and Anxiety Medications.    Consultants: ID Procedures performed: None Disposition: Home Diet recommendation:  Cardiac and Carb modified diet DISCHARGE MEDICATION: Allergies as of 11/16/2023       Reactions   Glimepiride Other (See Comments)   Gas and weight gain   Duloxetine  Hcl Nausea Only   Generic caused side effects        Medication List     PAUSE taking these medications    carvedilol 3.125 MG tablet Wait to  take this until your doctor or other care provider tells you to start again. Commonly known as: Coreg Take 1 tablet (3.125 mg total) by mouth 2 (two) times daily.        TAKE these medications    acetaminophen 500 MG tablet Commonly known as: TYLENOL Take 1-2 tablets (500-1,000 mg total) by mouth every 8 (eight) hours as needed (pain.). What changed: when to take this   amoxicillin 875 MG tablet Commonly known as: AMOXIL Take 875 mg by mouth 2 (two) times daily.   D3 + K2 DOTS PO Take 1 tablet by mouth in the morning.   Empagliflozin -metFORMIN  HCl ER 25-1000 MG Tb24 Take 1 tablet by mouth daily with breakfast.   fluticasone  50 MCG/ACT nasal spray Commonly known as: FLONASE  Place 1 spray into both nostrils 2 (two) times daily as needed for allergies or rhinitis (nasal congestion).   gabapentin  600 MG tablet Commonly known as: NEURONTIN  Take 1,800 mg by mouth in the morning and at bedtime.   losartan 25 MG tablet Commonly known as: COZAAR Take 0.5 tablets (12.5 mg total) by mouth every evening. What changed: how much to take   Mounjaro 10 MG/0.5ML Pen Generic drug: tirzepatide Inject 10 mg into the skin once a week.   pantoprazole  40 MG tablet Commonly known as: PROTONIX  Take 1 tablet (40 mg total) by mouth daily.   traMADol  50 MG tablet Commonly known as: ULTRAM  Take 50 mg by mouth every 8 (eight) hours as needed.        Follow-up Information     Weber, Lauraine CROME, PA-C. Schedule an appointment as soon as possible for a visit in 1 week(s).   Specialty: Physician Assistant Contact information: [address] [place] [address] [phone]                Discharge Exam: [name]   11/11/23 1737  Weight: 79.4 kg   General: NAD  Cardiovascular: S1, S2 present Respiratory: CTAB Abdomen: Soft, nontender, nondistended, bowel sounds present Musculoskeletal: No bilateral pedal edema noted Skin: Normal Psychiatry: Normal mood   Condition at discharge: stable  The results of significant diagnostics from this hospitalization (including imaging, microbiology, ancillary and laboratory) are  listed below for reference.   Imaging Studies: US  Abdomen Limited RUQ (LIVER/GB) Result Date: 11/11/2023 CLINICAL DATA:  Abdominal pain EXAM: ULTRASOUND ABDOMEN LIMITED RIGHT UPPER QUADRANT COMPARISON:  Ultrasound 12/16/2020 FINDINGS: Gallbladder: No obvious shadowing stones. Slight increased wall thickness at 4.9 mm, negative sonographic Murphy. Limited visibility Common bile duct: Diameter: 4.6 mm Liver: Liver cirrhosis. Limited visualization. Portal vein is patent on color imaging with biphasic flow/pulsatile flow Other: None. IMPRESSION: 1. Limited study secondary to habitus and limited windows. Liver cirrhosis. 2. Slight increased gallbladder wall thickness without visible stones. Negative sonographic Murphy. Findings are nonspecific and could be secondary to liver disease, or edematous state. If further imaging is required, suggest correlation with nuclear medicine hepatobiliary imaging. Electronically Signed   By: Luke Bun M.D.   On: 11/11/2023 22:46   CT Soft Tissue Neck W Contrast Result Date: 11/11/2023 EXAM: CT NECK WITH CONTRAST 11/11/2023 08:54:37 PM TECHNIQUE: CT of the neck was performed with the administration of intravenous contrast. Multiplanar reformatted images are provided for review. Automated exposure control, iterative reconstruction, and/or weight based adjustment of the mA/kV was utilized to reduce the radiation dose to as low as reasonably achievable. COMPARISON: None available. CLINICAL HISTORY: Soft tissue infection suspected, neck, xray done; recent esophageal banding now presenting  with fever unclear origin - evaluation for esophageal perforation or abscess. FINDINGS: AERODIGESTIVE TRACT: No discrete mass. No edema. SALIVARY GLANDS: The parotid and submandibular glands are unremarkable. THYROID: Unremarkable. LYMPH NODES: No suspicious cervical lymphadenopathy. SOFT TISSUES: No mass or fluid collection. BRAIN, ORBITS, SINUSES AND MASTOIDS: Opacified right maxillary sinus.  LUNGS AND MEDIASTINUM: No acute abnormality. BONES: No focal bone abnormality. IMPRESSION: 1. No evidence of abscess or acute abnormality in the neck. 2. Opacified right maxillary sinus. Electronically signed by: Gilmore Molt MD 11/11/2023 09:17 PM EST RP Workstation: [id number]   CT Chest W Contrast Result Date: 11/11/2023 CLINICAL DATA:  Sepsis, infection EXAM: CT CHEST WITH CONTRAST TECHNIQUE: Multidetector CT imaging of the chest was performed during intravenous contrast administration. RADIATION DOSE REDUCTION: This exam was performed according to the departmental dose-optimization program which includes automated exposure control, adjustment of the mA and/or kV according to patient size and/or use of iterative reconstruction technique. CONTRAST:  75mL OMNIPAQUE  IOHEXOL  300 MG/ML  SOLN COMPARISON:  11/11/2023 FINDINGS: Cardiovascular: The heart is unremarkable without pericardial effusion. No evidence of thoracic aortic aneurysm or dissection. Atherosclerosis of the aorta and coronary vasculature. Mediastinum/Nodes: No enlarged mediastinal, hilar, or axillary lymph nodes. Thyroid gland, trachea, and esophagus demonstrate no significant findings. Small hiatal hernia. Lungs/Pleura: No acute airspace disease, effusion, or pneumothorax. Minimal right lower lobe atelectasis. Central airways are patent. Upper Abdomen: Cirrhotic morphology of the liver. Mild gallbladder wall thickening measuring 4 mm. If there is concern for gallbladder pathology, right upper quadrant ultrasound may be useful. The spleen is incompletely imaged due to slice selection, but is likely enlarged. Sequela of portal venous hypertension with upper abdominal varices, most pronounced within the distal esophagus. Musculoskeletal: No acute or destructive bony abnormalities. Reconstructed images demonstrate no additional findings. IMPRESSION: 1. No acute intrathoracic process. 2. Cirrhosis, with portal venous hypertension manifested by  splenomegaly and upper abdominal varices. 3. Mild gallbladder wall thickening, mild specific in this patient with cirrhosis. If gallbladder pathology is suspected, right upper quadrant ultrasound could be performed. 4. Small hiatal hernia. 5. Aortic Atherosclerosis (ICD10-I70.0). Coronary artery atherosclerosis. Electronically Signed   By: Ozell Daring M.D.   On: 11/11/2023 21:04   DG Chest 2 View Result Date: 11/11/2023 EXAM: 2 VIEW(S) XRAY OF THE CHEST 11/11/2023 06:26:00 PM COMPARISON: 3 / 27 / 20 CLINICAL HISTORY: Fever. FINDINGS: LUNGS AND PLEURA: No focal pulmonary opacity. No pulmonary edema. No pleural effusion. No pneumothorax. HEART AND MEDIASTINUM: No acute abnormality of the cardiac and mediastinal silhouettes. BONES AND SOFT TISSUES: No acute osseous abnormality. IMPRESSION: 1. No acute process. Electronically signed by: Norman Gatlin MD 11/11/2023 06:31 PM EST RP Workstation: [id number]    Microbiology: Results for orders placed or performed during the hospital encounter of 11/11/23  Culture, blood (Routine x 2)     Status: None   Collection Time: 11/11/23  5:42 PM   Specimen: BLOOD  Result Value Ref Range Status   Specimen Description   Final    BLOOD LEFT ANTECUBITAL Performed at [facility], [address] [place], [place] [address]    Special Requests   Final    BOTTLES DRAWN AEROBIC AND ANAEROBIC Blood Culture adequate volume Performed at [facility], [address] [place], [place] [address]    Culture   Final    NO GROWTH 5 DAYS Performed at [facility], [address] [place], [place] [address]    Report Status 11/16/2023 FINAL  Final  Culture, blood (Routine x 2)  Status: None   Collection Time: 11/11/23  6:35 PM   Specimen: BLOOD  Result Value Ref Range Status   Specimen Description   Final    BLOOD RIGHT ANTECUBITAL Performed at [facility], [address], [place], [place]  [address]    Special Requests   Final    BOTTLES DRAWN AEROBIC AND ANAEROBIC Blood Culture adequate volume Performed at [facility], [address], [place], [place] [address]    Culture   Final    NO GROWTH 5 DAYS Performed at [facility], [address], [place], [place] [address]    Report Status 11/16/2023 FINAL  Final  Resp panel by RT-PCR (RSV, Flu A&B, Covid) Anterior Nasal Swab     Status: None   Collection Time: 11/11/23  7:06 PM   Specimen: Anterior Nasal Swab  Result Value Ref Range Status   SARS Coronavirus 2 by RT PCR NEGATIVE NEGATIVE Final    Comment: (NOTE) SARS-CoV-2 target nucleic acids are NOT DETECTED.  The SARS-CoV-2 RNA is generally detectable in upper respiratory specimens during the acute phase of infection. The lowest concentration of SARS-CoV-2 viral copies this assay can detect is 138 copies/mL. A negative result does not preclude SARS-Cov-2 infection and should not be used as the sole basis for treatment or other patient management decisions. A negative result may occur with  improper specimen collection/handling, submission of specimen other than nasopharyngeal swab, presence of viral mutation(s) within the areas targeted by this assay, and inadequate number of viral copies(<138 copies/mL). A negative result must be combined with clinical observations, patient history, and epidemiological information. The expected result is Negative.  Fact Sheet for Patients:  bloggercourse.com  Fact Sheet for Healthcare Providers:  seriousbroker.it  This test is no t yet approved or cleared by the [place]  FDA and  has been authorized for detection and/or diagnosis of SARS-CoV-2 by FDA under an Emergency Use Authorization (EUA). This EUA will remain  in effect (meaning this test can be used) for the duration of the COVID-19 declaration under Section 564(b)(1) of the Act,  21 U.S.C.section 360bbb-3(b)(1), unless the authorization is terminated  or revoked sooner.       Influenza A by PCR NEGATIVE NEGATIVE Final   Influenza B by PCR NEGATIVE NEGATIVE Final    Comment: (NOTE) The Xpert Xpress SARS-CoV-2/FLU/RSV plus assay is intended as an aid in the diagnosis of influenza from Nasopharyngeal swab specimens and should not be used as a sole basis for treatment. Nasal washings and aspirates are unacceptable for Xpert Xpress SARS-CoV-2/FLU/RSV testing.  Fact Sheet for Patients: bloggercourse.com  Fact Sheet for Healthcare Providers: seriousbroker.it  This test is not yet approved or cleared by the [place]  FDA and has been authorized for detection and/or diagnosis of SARS-CoV-2 by FDA under an Emergency Use Authorization (EUA). This EUA will remain in effect (meaning this test can be used) for the duration of the COVID-19 declaration under Section 564(b)(1) of the Act, 21 U.S.C. section 360bbb-3(b)(1), unless the authorization is terminated or revoked.     Resp Syncytial Virus by PCR NEGATIVE NEGATIVE Final    Comment: (NOTE) Fact Sheet for Patients: bloggercourse.com  Fact Sheet for Healthcare Providers: seriousbroker.it  This test is not yet approved or cleared by the [place]  FDA and has been authorized for detection and/or diagnosis of SARS-CoV-2 by FDA under an Emergency Use Authorization (EUA). This EUA will remain in effect (meaning this test can be used) for the duration of the  COVID-19 declaration under Section 564(b)(1) of the Act, 21 U.S.C. section 360bbb-3(b)(1), unless the authorization is terminated or revoked.  Performed at [facility], [address] [place], [place] [address]   Respiratory (~20 pathogens) panel by PCR     Status: None   Collection Time: 11/13/23  7:04 AM   Specimen:  Nasopharyngeal Swab; Respiratory  Result Value Ref Range Status   Adenovirus NOT DETECTED NOT DETECTED Final   Coronavirus 229E NOT DETECTED NOT DETECTED Final    Comment: (NOTE) The Coronavirus on the Respiratory Panel, DOES NOT test for the novel  Coronavirus (2019 nCoV)    Coronavirus HKU1 NOT DETECTED NOT DETECTED Final   Coronavirus NL63 NOT DETECTED NOT DETECTED Final   Coronavirus OC43 NOT DETECTED NOT DETECTED Final   Metapneumovirus NOT DETECTED NOT DETECTED Final   Rhinovirus / Enterovirus NOT DETECTED NOT DETECTED Final   Influenza A NOT DETECTED NOT DETECTED Final   Influenza B NOT DETECTED NOT DETECTED Final   Parainfluenza Virus 1 NOT DETECTED NOT DETECTED Final   Parainfluenza Virus 2 NOT DETECTED NOT DETECTED Final   Parainfluenza Virus 3 NOT DETECTED NOT DETECTED Final   Parainfluenza Virus 4 NOT DETECTED NOT DETECTED Final   Respiratory Syncytial Virus NOT DETECTED NOT DETECTED Final   Bordetella pertussis NOT DETECTED NOT DETECTED Final   Bordetella Parapertussis NOT DETECTED NOT DETECTED Final   Chlamydophila pneumoniae NOT DETECTED NOT DETECTED Final   Mycoplasma pneumoniae NOT DETECTED NOT DETECTED Final    Comment: Performed at [facility], [address] [place], [place] [address]    Labs: CBC: Recent Labs  Lab 11/11/23 1758 11/13/23 0824 11/14/23 0459 11/15/23 0539 11/16/23 0426  WBC 5.8 5.1 6.2 4.5 4.2  NEUTROABS 5.1 3.4 3.9 2.3 2.2  HGB 14.6 13.1 12.7* 13.3 13.2  HCT 40.7 37.0* 35.8* 37.4* 37.9*  MCV 93.3 94.4 93.7 93.3 95.2  PLT 77* 64* 74* 84* 93*   Basic Metabolic Panel: Recent Labs  Lab 11/11/23 1758 11/13/23 0824 11/14/23 0459 11/15/23 0539 11/16/23 0426  NA 136 139 140 140 143  K 3.7 3.6 3.7 3.4* 4.4  CL 100 107 105 104 107  CO2 23 24 24 24 23   GLUCOSE 142* 127* 101* 82 98  BUN 9 10 8 8 6   CREATININE 0.95 0.95 0.80 0.77 0.77  CALCIUM 9.5 8.4* 8.6* 8.6* 9.0  MG  --   --   --  1.9  --    Liver Function Tests: Recent  Labs  Lab 11/11/23 1758 11/13/23 0824 11/14/23 0459 11/15/23 0539 11/16/23 0426  AST 44* 37 31 32 43*  ALT 47* 36 29 31 37  ALKPHOS 82 49 55 53 70  BILITOT 1.0 0.7 0.8 1.0 0.6  PROT 7.4 5.8* 5.8* 6.2* 6.1*  ALBUMIN 4.2 2.7* 2.7* 2.9* 2.9*   CBG: Recent Labs  Lab 11/15/23 0809 11/15/23 1209 11/15/23 1706 11/15/23 2123 11/16/23 0853  GLUCAP 86 96 132* 125* 88    Discharge time spent: greater than 30 minutes.  Signed: Lebron JINNY Cage, MD [facility] 11/16/2023

## 2023-11-16 NOTE — Plan of Care (Signed)
